# Patient Record
Sex: Female | Born: 1935
Health system: Southern US, Community
[De-identification: ages and names within clinical notes are randomized; demographics above are authoritative.]

## PROBLEM LIST (undated history)

## (undated) DIAGNOSIS — Z9889 Other specified postprocedural states: Secondary | ICD-10-CM

## (undated) DIAGNOSIS — K219 Gastro-esophageal reflux disease without esophagitis: Secondary | ICD-10-CM

## (undated) DIAGNOSIS — H35039 Hypertensive retinopathy, unspecified eye: Secondary | ICD-10-CM

## (undated) DIAGNOSIS — E039 Hypothyroidism, unspecified: Secondary | ICD-10-CM

## (undated) DIAGNOSIS — E785 Hyperlipidemia, unspecified: Secondary | ICD-10-CM

## (undated) DIAGNOSIS — I10 Essential (primary) hypertension: Secondary | ICD-10-CM

## (undated) HISTORY — PX: CARPAL TUNNEL RELEASE: SHX101

## (undated) HISTORY — PX: EYE SURGERY: SHX253

## (undated) HISTORY — PX: APPENDECTOMY: SHX54

## (undated) HISTORY — PX: LUMBAR LAMINECTOMY: SHX95

## (undated) HISTORY — DX: Essential (primary) hypertension: I10

## (undated) HISTORY — PX: COLONOSCOPY: SHX174

## (undated) HISTORY — DX: Other specified postprocedural states: Z98.890

## (undated) HISTORY — PX: TONSILLECTOMY: SUR1361

## (undated) HISTORY — DX: Hyperlipidemia, unspecified: E78.5

## (undated) HISTORY — PX: BREAST CYST ASPIRATION: SHX578

## (undated) HISTORY — DX: Hypertensive retinopathy, unspecified eye: H35.039

---

## 2001-08-20 ENCOUNTER — Ambulatory Visit (HOSPITAL_COMMUNITY): Admission: RE | Admit: 2001-08-20 | Discharge: 2001-08-20 | Payer: Self-pay | Admitting: Neurology

## 2001-08-20 ENCOUNTER — Encounter: Payer: Self-pay | Admitting: Obstetrics and Gynecology

## 2001-09-02 ENCOUNTER — Other Ambulatory Visit: Admission: RE | Admit: 2001-09-02 | Discharge: 2001-09-02 | Payer: Self-pay | Admitting: Obstetrics and Gynecology

## 2002-08-31 ENCOUNTER — Encounter: Payer: Self-pay | Admitting: Obstetrics and Gynecology

## 2002-08-31 ENCOUNTER — Ambulatory Visit (HOSPITAL_COMMUNITY): Admission: RE | Admit: 2002-08-31 | Discharge: 2002-08-31 | Payer: Self-pay | Admitting: Internal Medicine

## 2003-09-07 ENCOUNTER — Ambulatory Visit (HOSPITAL_COMMUNITY): Admission: RE | Admit: 2003-09-07 | Discharge: 2003-09-07 | Payer: Self-pay | Admitting: Obstetrics and Gynecology

## 2004-09-19 ENCOUNTER — Ambulatory Visit (HOSPITAL_COMMUNITY): Admission: RE | Admit: 2004-09-19 | Discharge: 2004-09-19 | Payer: Self-pay | Admitting: Obstetrics and Gynecology

## 2005-10-24 ENCOUNTER — Ambulatory Visit (HOSPITAL_COMMUNITY): Admission: RE | Admit: 2005-10-24 | Discharge: 2005-10-24 | Payer: Self-pay | Admitting: Obstetrics and Gynecology

## 2005-11-27 ENCOUNTER — Ambulatory Visit (HOSPITAL_COMMUNITY): Admission: RE | Admit: 2005-11-27 | Discharge: 2005-11-27 | Payer: Self-pay | Admitting: Family Medicine

## 2006-04-24 ENCOUNTER — Ambulatory Visit (HOSPITAL_BASED_OUTPATIENT_CLINIC_OR_DEPARTMENT_OTHER): Admission: RE | Admit: 2006-04-24 | Discharge: 2006-04-24 | Payer: Self-pay | Admitting: Orthopedic Surgery

## 2006-04-24 ENCOUNTER — Encounter (INDEPENDENT_AMBULATORY_CARE_PROVIDER_SITE_OTHER): Payer: Self-pay | Admitting: *Deleted

## 2006-11-04 ENCOUNTER — Ambulatory Visit (HOSPITAL_COMMUNITY): Admission: RE | Admit: 2006-11-04 | Discharge: 2006-11-04 | Payer: Self-pay | Admitting: Obstetrics and Gynecology

## 2007-11-10 ENCOUNTER — Ambulatory Visit (HOSPITAL_COMMUNITY): Admission: RE | Admit: 2007-11-10 | Discharge: 2007-11-10 | Payer: Self-pay | Admitting: Obstetrics and Gynecology

## 2008-01-28 ENCOUNTER — Ambulatory Visit: Payer: Self-pay | Admitting: Orthopedic Surgery

## 2008-01-28 DIAGNOSIS — M5430 Sciatica, unspecified side: Secondary | ICD-10-CM

## 2008-01-28 DIAGNOSIS — M545 Low back pain: Secondary | ICD-10-CM

## 2008-03-10 ENCOUNTER — Ambulatory Visit (HOSPITAL_COMMUNITY): Admission: RE | Admit: 2008-03-10 | Discharge: 2008-03-10 | Payer: Self-pay | Admitting: Family Medicine

## 2008-06-07 ENCOUNTER — Encounter: Admission: RE | Admit: 2008-06-07 | Discharge: 2008-06-07 | Payer: Self-pay | Admitting: Orthopedic Surgery

## 2008-06-08 ENCOUNTER — Ambulatory Visit (HOSPITAL_BASED_OUTPATIENT_CLINIC_OR_DEPARTMENT_OTHER): Admission: RE | Admit: 2008-06-08 | Discharge: 2008-06-08 | Payer: Self-pay | Admitting: Orthopedic Surgery

## 2008-08-19 ENCOUNTER — Ambulatory Visit: Payer: Self-pay | Admitting: Orthopedic Surgery

## 2008-08-20 ENCOUNTER — Telehealth: Payer: Self-pay | Admitting: Orthopedic Surgery

## 2008-08-24 ENCOUNTER — Ambulatory Visit (HOSPITAL_COMMUNITY): Admission: RE | Admit: 2008-08-24 | Discharge: 2008-08-24 | Payer: Self-pay | Admitting: Orthopedic Surgery

## 2008-09-01 ENCOUNTER — Ambulatory Visit: Payer: Self-pay | Admitting: Orthopedic Surgery

## 2008-09-01 DIAGNOSIS — M5126 Other intervertebral disc displacement, lumbar region: Secondary | ICD-10-CM

## 2008-09-06 ENCOUNTER — Telehealth: Payer: Self-pay | Admitting: Orthopedic Surgery

## 2008-09-06 ENCOUNTER — Encounter: Payer: Self-pay | Admitting: Orthopedic Surgery

## 2008-09-09 ENCOUNTER — Telehealth: Payer: Self-pay | Admitting: Orthopedic Surgery

## 2008-09-10 ENCOUNTER — Encounter (INDEPENDENT_AMBULATORY_CARE_PROVIDER_SITE_OTHER): Payer: Self-pay | Admitting: *Deleted

## 2008-09-13 ENCOUNTER — Encounter: Admission: RE | Admit: 2008-09-13 | Discharge: 2008-09-13 | Payer: Self-pay | Admitting: Orthopedic Surgery

## 2008-09-17 ENCOUNTER — Encounter: Payer: Self-pay | Admitting: Orthopedic Surgery

## 2008-09-20 ENCOUNTER — Telehealth: Payer: Self-pay | Admitting: Orthopedic Surgery

## 2008-09-20 ENCOUNTER — Encounter: Payer: Self-pay | Admitting: Orthopedic Surgery

## 2008-09-27 ENCOUNTER — Encounter: Admission: RE | Admit: 2008-09-27 | Discharge: 2008-09-27 | Payer: Self-pay | Admitting: Orthopedic Surgery

## 2008-10-05 ENCOUNTER — Encounter: Payer: Self-pay | Admitting: Orthopedic Surgery

## 2008-11-26 ENCOUNTER — Ambulatory Visit (HOSPITAL_COMMUNITY): Admission: RE | Admit: 2008-11-26 | Discharge: 2008-11-26 | Payer: Self-pay | Admitting: Obstetrics and Gynecology

## 2009-02-14 ENCOUNTER — Encounter: Admission: RE | Admit: 2009-02-14 | Discharge: 2009-02-14 | Payer: Self-pay | Admitting: Orthopedic Surgery

## 2009-04-20 ENCOUNTER — Encounter: Admission: RE | Admit: 2009-04-20 | Discharge: 2009-04-20 | Payer: Self-pay | Admitting: Neurosurgery

## 2009-05-19 ENCOUNTER — Inpatient Hospital Stay (HOSPITAL_COMMUNITY): Admission: RE | Admit: 2009-05-19 | Discharge: 2009-05-20 | Payer: Self-pay | Admitting: Neurosurgery

## 2009-05-19 ENCOUNTER — Encounter (INDEPENDENT_AMBULATORY_CARE_PROVIDER_SITE_OTHER): Payer: Self-pay | Admitting: Neurosurgery

## 2009-08-11 ENCOUNTER — Encounter (HOSPITAL_COMMUNITY): Admission: RE | Admit: 2009-08-11 | Discharge: 2009-09-10 | Payer: Self-pay | Admitting: Neurosurgery

## 2009-12-15 ENCOUNTER — Ambulatory Visit (HOSPITAL_COMMUNITY): Admission: RE | Admit: 2009-12-15 | Discharge: 2009-12-15 | Payer: Self-pay | Admitting: Obstetrics and Gynecology

## 2010-10-29 ENCOUNTER — Encounter: Payer: Self-pay | Admitting: Obstetrics and Gynecology

## 2010-11-21 ENCOUNTER — Other Ambulatory Visit: Payer: Self-pay | Admitting: Obstetrics and Gynecology

## 2010-11-21 DIAGNOSIS — Z139 Encounter for screening, unspecified: Secondary | ICD-10-CM

## 2010-12-18 ENCOUNTER — Ambulatory Visit (HOSPITAL_COMMUNITY): Payer: Self-pay

## 2010-12-21 ENCOUNTER — Ambulatory Visit (HOSPITAL_COMMUNITY)
Admission: RE | Admit: 2010-12-21 | Discharge: 2010-12-21 | Disposition: A | Discharge status: Home / Self Care | Payer: Medicare Other | Source: Ambulatory Visit | Attending: Obstetrics and Gynecology | Admitting: Obstetrics and Gynecology

## 2010-12-21 DIAGNOSIS — Z139 Encounter for screening, unspecified: Secondary | ICD-10-CM

## 2010-12-21 DIAGNOSIS — Z1231 Encounter for screening mammogram for malignant neoplasm of breast: Secondary | ICD-10-CM | POA: Insufficient documentation

## 2011-01-08 ENCOUNTER — Other Ambulatory Visit: Payer: Self-pay | Admitting: Neurosurgery

## 2011-01-08 DIAGNOSIS — M545 Low back pain: Secondary | ICD-10-CM

## 2011-01-13 LAB — CBC
Hemoglobin: 13.5 g/dL (ref 12.0–15.0)
MCV: 99.1 fL (ref 78.0–100.0)
RBC: 4.01 MIL/uL (ref 3.87–5.11)

## 2011-01-13 LAB — BASIC METABOLIC PANEL
CO2: 33 mEq/L — ABNORMAL HIGH (ref 19–32)
GFR calc non Af Amer: >60 mL/min (ref >60)
Glucose, Bld: 100 mg/dL — ABNORMAL HIGH (ref 70–99)

## 2011-01-13 LAB — TYPE AND SCREEN: Antibody Screen: NEGATIVE

## 2011-01-15 ENCOUNTER — Other Ambulatory Visit: Payer: Self-pay | Admitting: Neurosurgery

## 2011-01-15 ENCOUNTER — Ambulatory Visit
Admission: RE | Admit: 2011-01-15 | Discharge: 2011-01-15 | Disposition: A | Discharge status: Home / Self Care | Payer: Medicare Other | Source: Ambulatory Visit | Attending: Neurosurgery | Admitting: Neurosurgery

## 2011-01-15 DIAGNOSIS — M545 Low back pain: Secondary | ICD-10-CM

## 2011-01-15 DIAGNOSIS — M79603 Pain in arm, unspecified: Secondary | ICD-10-CM

## 2011-01-15 DIAGNOSIS — M5410 Radiculopathy, site unspecified: Secondary | ICD-10-CM

## 2011-01-15 DIAGNOSIS — M79606 Pain in leg, unspecified: Secondary | ICD-10-CM

## 2011-01-15 MED ORDER — GADOBENATE DIMEGLUMINE 529 MG/ML IV SOLN
12.0000 mL | Freq: Once | INTRAVENOUS | Status: AC | PRN
  Administered 2011-01-15: 12 mL via INTRAVENOUS

## 2011-01-18 ENCOUNTER — Ambulatory Visit
Admission: RE | Admit: 2011-01-18 | Discharge: 2011-01-18 | Disposition: A | Discharge status: Home / Self Care | Payer: Medicare Other | Source: Ambulatory Visit | Attending: Neurosurgery | Admitting: Neurosurgery

## 2011-01-18 DIAGNOSIS — M5410 Radiculopathy, site unspecified: Secondary | ICD-10-CM

## 2011-01-18 DIAGNOSIS — M79603 Pain in arm, unspecified: Secondary | ICD-10-CM

## 2011-01-18 DIAGNOSIS — M79606 Pain in leg, unspecified: Secondary | ICD-10-CM

## 2011-01-31 ENCOUNTER — Other Ambulatory Visit: Payer: Self-pay | Admitting: Obstetrics & Gynecology

## 2011-01-31 ENCOUNTER — Other Ambulatory Visit (HOSPITAL_COMMUNITY)
Admission: RE | Admit: 2011-01-31 | Discharge: 2011-01-31 | Disposition: A | Discharge status: Home / Self Care | Payer: Medicare Other | Source: Ambulatory Visit | Attending: Obstetrics & Gynecology | Admitting: Obstetrics & Gynecology

## 2011-01-31 DIAGNOSIS — Z124 Encounter for screening for malignant neoplasm of cervix: Secondary | ICD-10-CM | POA: Insufficient documentation

## 2011-02-20 NOTE — Op Note (Signed)
Kaitlyn Sanchez, Kaitlyn Sanchez                ACCOUNT NO.:  [id number]   MEDICAL RECORD NO.:  [id number]          PATIENT TYPE:  AMB   LOCATION:  DSC                          FACILITY:  [facility]   PHYSICIAN:  Cindee Salt, M.D.       DATE OF BIRTH:  [birth year]   DATE OF PROCEDURE:  06/08/2008  DATE OF DISCHARGE:                               OPERATIVE REPORT   PREOPERATIVE DIAGNOSIS:  Carpal tunnel syndrome, left hand.   POSTOPERATIVE DIAGNOSIS:  Carpal tunnel syndrome, left hand.   OPERATION:  Decompression of left median nerve.   SURGEON:  Cindee Salt, MD   ASSISTANT:  None.   ANESTHESIA:  Forearm-based IV regional with supplementation.   ANESTHESIOLOGIST:  Maren Beach, MD.   DATE OF OPERATION:  June 08, 2008.   HISTORY:  The patient is a 76 year old female with a history of carpal  tunnel syndrome.  EMG nerve conductions positive.  This has not  responded to conservative treatment.  She has elected to undergo  surgical decompression.  Postoperative course was discussed along the  risks and complications.  She is aware that there is no guarantee with  surgery, possible of infection, recurrence, injury to arteries, nerves,  and tendons, incomplete relief of symptoms, and dystrophy.  In the  preoperative area, the patient is seen.  The extremity marked by both  the patient and surgeon.  Antibiotic given.   PROCEDURE:  The patient is brought to the operating room where forearm-  based IV regional anesthetic was carried out without difficulty with the  left arm free.  She was prepped using DuraPrep, supine position.  A time-  out was taken.  A longitudinal incision was made in the palm.  She had  some feeling.  Xylocaine 1% was then infiltrated, 2 mL were used.  The  dissection was carried down to the palmar fascia.  This was split.  Superficial palmar arch identified.  The flexor tendon to the ring and  little finger identified to the ulnar side of median nerve.  Carpal  retinaculum was incised with sharp dissection.  A right-angle and Sewall  retractor were placed between skin and forearm fascia.  The fascia  released for approximately a centimeter and half proximal to the wrist  crease under direct vision.  The canal was explored.  Air compression to  the nerve was apparent.  No further lesions were identified.  The wound  was irrigated with saline.  The skin was closed with interrupted 5-0  Vicryl Rapide sutures.  Sterile compressive dressing and splint to the wrist were applied.  The  patient tolerated the procedure well.  She was taken to the recovery  room for observation in satisfactory condition.  She will be discharged  home to return to [facility] [place] in 1 week on Vicodin.           ______________________________  Cindee Salt, M.D.     GK/MEDQ  D:  06/08/2008  T:  06/09/2008  Job:  [id number]

## 2011-02-20 NOTE — Op Note (Signed)
Kaitlyn Sanchez, Kaitlyn Sanchez                ACCOUNT NO.:  [id number]   MEDICAL RECORD NO.:  [id number]          PATIENT TYPE:  INP   LOCATION:  3533                         FACILITY:  [facility]   PHYSICIAN:  Reinaldo Meeker, M.D. DATE OF BIRTH:  1935-08-07   DATE OF PROCEDURE:  05/19/2009  DATE OF DISCHARGE:                               OPERATIVE REPORT   PREOPERATIVE DIAGNOSIS:  Synovial cyst, L5-S1, left.   POSTOPERATIVE DIAGNOSIS:  Synovial cyst, L5-S1, left.   PROCEDURE:  Left L5-S1 interlaminar laminotomy with resection of  synovial cyst, followed by left L5-S1 transforaminal lumbar interbody  fusion with PEEK interbody spacer, followed by nonsegmental  instrumentation L5-S1 with transfacet screw and spinous process plate,  followed by nonsegmental posterolateral fusion L5-S1.   SECONDARY PROCEDURE:  Decompression of L5 and S1 nerve roots, more so  than needed for transforaminal lumbar interbody fusion.   SURGEON:  Reinaldo Meeker, MD   ASSISTANT:  Donalee Citrin, MD   PROCEDURE IN DETAIL:  After placing in the prone position, the patient's  back was prepped and draped in usual sterile fashion.  Localizing x-rays  were taken prior to incision to identify the appropriate level.  Midline  incision was made above the spinous processes of L5 and S1.  Using Bovie  cutting current, the incision was carried down the spinous processes.  Subperiosteal dissection was then carried out bilaterally on the spinous  processes, lamina, and facet joint.  Self-retaining retractor was placed  for exposure.  X-rays showed approach to the appropriate level.  On the  patient's left side, generous laminotomy was performed by removing the  inferior 80% of the L5 lamina, medial three-quarters of facet joint, and  the superior one half of the S1 segment.  Residual bone was removed and  saved for use later in the case.  Ligamentum flavum was removed in a  piecemeal fashion.  Obvious synovial cyst was then noted  with marked  compression of the L5 and S1 nerve roots.  This was dissected free and  then removed in a piecemeal fashion until the entire cyst had been  removed, and the nerve roots and thecal sac were well decompressed.  At  this time, inspection was carried out for any evidence of residual  compression and none could be identified.  Disk space at L5-S1 was then  incised and thoroughly cleaned out with pituitary rongeurs and curettes.  At this time, disk was prepared for transverse lumbar interbody fusion.  It was distracted up to a 10-mm size and this was felt to be a good fit.  Rotating cutters and curettes were then used to clean out the disk space  completely.  A mixture of autologous bone and OsteoSet Plus were then  placed deep within the disk space, to help with the interbody fusion.  A  PEEK interbody cage filled with the same mixture was then placed and  kicked into good position crossing the midline.  This was followed into  position by AP and lateral fluoroscopy.  At this time, instrumentation  was carried out.  Transfacet screws were placed  on the patient's right  side.  A small drill hole entry point was placed, followed by passing  the drill over a guidewire, tapping, and placing of a 30-mm screw into  good position.  All this was done under AP and lateral fluoroscopy.  In  addition, we used EMG monitoring to confirm no breech of the pedicle and  this was confirmed.  At this time, interspinous ligament was removed.  A  35-mm spinous process plate was then chosen and secured in standard  fashion at the spinous processes of L5 and S1; and final fluoroscopy in  AP and lateral direction showed excellent placement of the interbody  spacer, transfacet screw, and a fixed plate.  At this time, large  amounts of irrigation were carried out.  Gelfoam was placed over the  dura on the patient's left side.  On the patient's right side, high-  speed drill was used to decorticate the  lamina and facet joints, and a  mixture of OsteoSet Plus and autologous bone were placed for  posterolateral fusion.  Self-retaining retractor was removed.  Any  bleeding was controlled with unipolar coagulation.  The wound was then  closed in multiple layers of Vicryl in the muscle, fascia, subcutaneous,  and subcuticular tissues; and the staples were placed on the skin.  Sterile dressing was then applied and the patient was extubated, taken  to recovery room in stable condition.           ______________________________  Reinaldo Meeker, M.D.     ROK/MEDQ  D:  05/19/2009  T:  05/19/2009  Job:  [id number]

## 2011-02-23 NOTE — Op Note (Signed)
Kaitlyn Sanchez, Kaitlyn Sanchez                ACCOUNT NO.:  [id number]   MEDICAL RECORD NO.:  [id number]          PATIENT TYPE:  AMB   LOCATION:  DSC                          FACILITY:  [facility]   PHYSICIAN:  Cindee Salt, M.D.       DATE OF BIRTH:  Nov 04, 1934   DATE OF PROCEDURE:  04/24/2006  DATE OF DISCHARGE:                                 OPERATIVE REPORT   PREOPERATIVE DIAGNOSIS:  Mass, dorsal aspect right hand.   POSTOPERATIVE DIAGNOSIS:  Mass, dorsal aspect right hand.   OPERATION:  Excision of mass, dorsal aspect right hand.   SURGEON:  Cindee Salt, M.D.   ANESTHESIA:  Local.   HISTORY:  The patient is a 76 year old female with a history of a mass over  the metacarpal of her right index finger.  She is desirous of removal of  this in that it is getting caught.  It appears to be a hyperkeratosis,  keratotic horn.   PROCEDURE:  The patient was brought to minor room, prepped using DuraPrep,  supine position, right arm free.  A tourniquet placed on the forearm was  inflated to 250 mmHg.  The area was infiltrated.  An ellipse of skin was  then made removing the entire skin lesion.  This was tagged distally.  The  area was undermined and closed with interrupted 5-0 nylon sutures.  A  sterile compressive dressing was applied.  The patient tolerated the  procedure and is discharged home to return to the [facility] [place]  in 1 week on Vicodin.           ______________________________  Cindee Salt, M.D.     GK/MEDQ  D:  04/24/2006  T:  04/24/2006  Job:  [id number]

## 2011-03-02 ENCOUNTER — Ambulatory Visit (HOSPITAL_COMMUNITY)
Admission: RE | Admit: 2011-03-02 | Discharge: 2011-03-02 | Disposition: A | Discharge status: Home / Self Care | Payer: Medicare Other | Source: Ambulatory Visit | Attending: Neurosurgery | Admitting: Neurosurgery

## 2011-03-02 ENCOUNTER — Encounter (HOSPITAL_COMMUNITY)
Admission: RE | Admit: 2011-03-02 | Discharge: 2011-03-02 | Disposition: A | Discharge status: Home / Self Care | Payer: Medicare Other | Source: Ambulatory Visit | Attending: Neurosurgery | Admitting: Neurosurgery

## 2011-03-02 ENCOUNTER — Other Ambulatory Visit (HOSPITAL_COMMUNITY): Payer: Self-pay | Admitting: Neurosurgery

## 2011-03-02 DIAGNOSIS — Z01810 Encounter for preprocedural cardiovascular examination: Secondary | ICD-10-CM | POA: Insufficient documentation

## 2011-03-02 DIAGNOSIS — M47816 Spondylosis without myelopathy or radiculopathy, lumbar region: Secondary | ICD-10-CM

## 2011-03-02 DIAGNOSIS — J4489 Other specified chronic obstructive pulmonary disease: Secondary | ICD-10-CM | POA: Insufficient documentation

## 2011-03-02 DIAGNOSIS — J449 Chronic obstructive pulmonary disease, unspecified: Secondary | ICD-10-CM | POA: Insufficient documentation

## 2011-03-02 DIAGNOSIS — Z01812 Encounter for preprocedural laboratory examination: Secondary | ICD-10-CM | POA: Insufficient documentation

## 2011-03-02 DIAGNOSIS — M47817 Spondylosis without myelopathy or radiculopathy, lumbosacral region: Secondary | ICD-10-CM | POA: Insufficient documentation

## 2011-03-02 DIAGNOSIS — Z01818 Encounter for other preprocedural examination: Secondary | ICD-10-CM | POA: Insufficient documentation

## 2011-03-02 DIAGNOSIS — I10 Essential (primary) hypertension: Secondary | ICD-10-CM | POA: Insufficient documentation

## 2011-03-02 LAB — CBC
HCT: 38.5 % (ref 36.0–46.0)
MCV: 96.7 fL (ref 78.0–100.0)
RDW: 12.8 % (ref 11.5–15.5)
WBC: 6 10*3/uL (ref 4.0–10.5)

## 2011-03-02 LAB — BASIC METABOLIC PANEL
BUN: 17 mg/dL (ref 6–23)
CO2: 34 mEq/L — ABNORMAL HIGH (ref 19–32)
Chloride: 100 mEq/L (ref 96–112)
GFR calc non Af Amer: >60 mL/min (ref >60)
Glucose, Bld: 96 mg/dL (ref 70–99)
Potassium: 3.7 mEq/L (ref 3.5–5.1)

## 2011-03-08 ENCOUNTER — Inpatient Hospital Stay (HOSPITAL_COMMUNITY): Payer: Medicare Other

## 2011-03-08 ENCOUNTER — Inpatient Hospital Stay (HOSPITAL_COMMUNITY)
Admission: RE | Admit: 2011-03-08 | Discharge: 2011-03-09 | DRG: 460 | Disposition: A | Discharge status: Home / Self Care | Payer: Medicare Other | Source: Ambulatory Visit | Attending: Neurosurgery | Admitting: Neurosurgery

## 2011-03-08 DIAGNOSIS — J4489 Other specified chronic obstructive pulmonary disease: Secondary | ICD-10-CM | POA: Diagnosis present

## 2011-03-08 DIAGNOSIS — M47817 Spondylosis without myelopathy or radiculopathy, lumbosacral region: Principal | ICD-10-CM | POA: Diagnosis present

## 2011-03-08 DIAGNOSIS — J449 Chronic obstructive pulmonary disease, unspecified: Secondary | ICD-10-CM | POA: Diagnosis present

## 2011-03-08 DIAGNOSIS — I10 Essential (primary) hypertension: Secondary | ICD-10-CM | POA: Diagnosis present

## 2011-03-08 DIAGNOSIS — M7130 Other bursal cyst, unspecified site: Secondary | ICD-10-CM | POA: Diagnosis present

## 2011-04-05 NOTE — Op Note (Signed)
Kaitlyn Sanchez, Kaitlyn Sanchez                ACCOUNT NO.:  [id number]  MEDICAL RECORD NO.:  [id number]           PATIENT TYPE:  I  LOCATION:  3034                         FACILITY:  [facility]  PHYSICIAN:  Reinaldo Meeker, M.D. DATE OF BIRTH:  [birth year]  DATE OF PROCEDURE:  03/08/2011 DATE OF DISCHARGE:                              OPERATIVE REPORT   PREOPERATIVE DIAGNOSES:  Spondylosis and synovial cyst L5-S1 right with pseudoarthrosis L5-S1 status post left L5-S1 transforaminal lumbar interbody fusion.  POSTOPERATIVE DIAGNOSES:  Spondylosis and synovial cyst L5-S1 right with pseudoarthrosis L5-S1 status post left L5-S1 transforaminal lumbar interbody fusion.  PROCEDURE:  Right L5-S1 decompressive laminotomy with removal of synovial cyst followed by right L5-S1 percutaneous pedicle screw with Zimmer percutaneous pedicle screw system followed by L5-S1 posterolateral fusion.  SURGEON:  Reinaldo Meeker, MD  ASSISTANT:  Tia Alert, MDPROCEDURE IN DETAIL:  After being placed in a prone position, the patient's back was prepped and draped in usual sterile fashion. Previous lumbar incision was opened up and carried on the spinous processes.  Subperiosteal dissection was carried out bilaterally to identify the spinous process, plate was removed without difficulty.  We then did a subperiosteal dissection on the right side on the spinous processes and lamina facet joint.  Self-retaining retractor was placed for exposure.  We then moved the trans facet screw.  We then did a generous laminotomy by removing the inferior one-half of the L5 lamina, medial half of the facet joint at L5-S1, the superior third of the S1 segment.  Residual bone was removed and saved for use later in the case. Ligamentum flavum was removed and we encountered obvious synovial cyst pushing against the S1 nerve root.  I then dissected out free the nerve root.  We then coagulated any veins to make sure the S1 nerve root  was now well decompressed which it was.  We also were able to easily visualize the L5 nerve root.  At this point, we felt the decompression was adequate.  We then did a percutaneous pedicle screws on the right side.  We made a small stab wound incision, passed Jamshidi needle from lateral to medial direction to the pedicles of L5-S1, passed a wire.  We connected the incision.  At this point, we identified the transverse process of L5 and far away to the sacrum, decorticated with the Cobb periosteal elevator, and packed large amount of EquivaBone and autologous bone.  We then did sequential dilation, tapped with a 5 x 5 tap and placed 6.5 x 40-mm screw at L5 and 6.5 x 35-mm screw at S1.  We then placed the appropriate length rod, tapped with the screws, and secured with the top loading nuts and tightened final tightening.  Final fluoroscopy in AP and lateral direction showed excellent placement of the screws and rod.  Large amounts of irrigation was carried out both incisions and the wounds were both closed with multiple layers of Vicryl, muscle, fascia, subcutaneous and subcuticular stitches. Staples were placed on the skin.  Dry dressing was then applied.  The patient was then extubated and taken to the recovery room  in a stable condition.          ______________________________ Reinaldo Meeker, M.D.     ROK/MEDQ  D:  03/08/2011  T:  03/09/2011  Job:  [id number]  Electronically Signed by Aliene Beams M.D. on 04/05/2011 10:35:03 AM

## 2011-04-13 ENCOUNTER — Other Ambulatory Visit: Payer: Self-pay | Admitting: *Deleted

## 2011-04-13 NOTE — Telephone Encounter (Signed)
Denied Lyrica has not been seen since 2009

## 2011-04-16 ENCOUNTER — Other Ambulatory Visit: Payer: Self-pay | Admitting: Neurosurgery

## 2011-04-16 ENCOUNTER — Ambulatory Visit
Admission: RE | Admit: 2011-04-16 | Discharge: 2011-04-16 | Disposition: A | Discharge status: Home / Self Care | Payer: Medicare Other | Source: Ambulatory Visit | Attending: Neurosurgery | Admitting: Neurosurgery

## 2011-04-16 DIAGNOSIS — M48061 Spinal stenosis, lumbar region without neurogenic claudication: Secondary | ICD-10-CM

## 2011-06-04 ENCOUNTER — Ambulatory Visit
Admission: RE | Admit: 2011-06-04 | Discharge: 2011-06-04 | Disposition: A | Discharge status: Home / Self Care | Payer: Medicare Other | Source: Ambulatory Visit | Attending: Neurosurgery | Admitting: Neurosurgery

## 2011-06-04 ENCOUNTER — Other Ambulatory Visit: Payer: Self-pay | Admitting: Neurosurgery

## 2011-06-04 DIAGNOSIS — M47817 Spondylosis without myelopathy or radiculopathy, lumbosacral region: Secondary | ICD-10-CM

## 2011-10-09 DIAGNOSIS — Z9889 Other specified postprocedural states: Secondary | ICD-10-CM

## 2011-10-09 HISTORY — DX: Other specified postprocedural states: Z98.890

## 2011-10-23 DIAGNOSIS — H43399 Other vitreous opacities, unspecified eye: Secondary | ICD-10-CM | POA: Diagnosis not present

## 2011-10-23 DIAGNOSIS — H2510 Age-related nuclear cataract, unspecified eye: Secondary | ICD-10-CM | POA: Diagnosis not present

## 2011-10-23 DIAGNOSIS — H5200 Hypermetropia, unspecified eye: Secondary | ICD-10-CM | POA: Diagnosis not present

## 2011-10-23 DIAGNOSIS — H52229 Regular astigmatism, unspecified eye: Secondary | ICD-10-CM | POA: Diagnosis not present

## 2011-12-10 ENCOUNTER — Other Ambulatory Visit: Payer: Self-pay | Admitting: Obstetrics and Gynecology

## 2011-12-10 DIAGNOSIS — Z139 Encounter for screening, unspecified: Secondary | ICD-10-CM

## 2011-12-24 ENCOUNTER — Ambulatory Visit (HOSPITAL_COMMUNITY): Payer: Medicare Other

## 2011-12-25 ENCOUNTER — Ambulatory Visit (HOSPITAL_COMMUNITY)
Admission: RE | Admit: 2011-12-25 | Discharge: 2011-12-25 | Disposition: A | Discharge status: Home / Self Care | Payer: Medicare Other | Source: Ambulatory Visit | Attending: Obstetrics and Gynecology | Admitting: Obstetrics and Gynecology

## 2011-12-25 DIAGNOSIS — Z139 Encounter for screening, unspecified: Secondary | ICD-10-CM

## 2011-12-25 DIAGNOSIS — Z1231 Encounter for screening mammogram for malignant neoplasm of breast: Secondary | ICD-10-CM | POA: Diagnosis not present

## 2012-01-28 DIAGNOSIS — T148 Other injury of unspecified body region: Secondary | ICD-10-CM | POA: Diagnosis not present

## 2012-01-28 DIAGNOSIS — IMO0002 Reserved for concepts with insufficient information to code with codable children: Secondary | ICD-10-CM | POA: Diagnosis not present

## 2012-01-28 DIAGNOSIS — W57XXXA Bitten or stung by nonvenomous insect and other nonvenomous arthropods, initial encounter: Secondary | ICD-10-CM | POA: Diagnosis not present

## 2012-02-25 DIAGNOSIS — IMO0002 Reserved for concepts with insufficient information to code with codable children: Secondary | ICD-10-CM | POA: Diagnosis not present

## 2012-02-25 DIAGNOSIS — E785 Hyperlipidemia, unspecified: Secondary | ICD-10-CM | POA: Diagnosis not present

## 2012-02-25 DIAGNOSIS — I10 Essential (primary) hypertension: Secondary | ICD-10-CM | POA: Diagnosis not present

## 2012-02-25 DIAGNOSIS — E039 Hypothyroidism, unspecified: Secondary | ICD-10-CM | POA: Diagnosis not present

## 2012-02-28 DIAGNOSIS — E890 Postprocedural hypothyroidism: Secondary | ICD-10-CM | POA: Diagnosis not present

## 2012-03-13 ENCOUNTER — Other Ambulatory Visit (HOSPITAL_COMMUNITY): Payer: Self-pay | Admitting: Physician Assistant

## 2012-03-13 DIAGNOSIS — Z0000 Encounter for general adult medical examination without abnormal findings: Secondary | ICD-10-CM

## 2012-03-13 DIAGNOSIS — Z139 Encounter for screening, unspecified: Secondary | ICD-10-CM

## 2012-03-18 ENCOUNTER — Ambulatory Visit (HOSPITAL_COMMUNITY)
Admission: RE | Admit: 2012-03-18 | Discharge: 2012-03-18 | Disposition: A | Discharge status: Home / Self Care | Payer: Medicare Other | Source: Ambulatory Visit | Attending: Physician Assistant | Admitting: Physician Assistant

## 2012-03-18 DIAGNOSIS — Z13820 Encounter for screening for osteoporosis: Secondary | ICD-10-CM | POA: Insufficient documentation

## 2012-03-18 DIAGNOSIS — M899 Disorder of bone, unspecified: Secondary | ICD-10-CM | POA: Insufficient documentation

## 2012-03-18 DIAGNOSIS — Z139 Encounter for screening, unspecified: Secondary | ICD-10-CM

## 2012-03-18 DIAGNOSIS — Z0000 Encounter for general adult medical examination without abnormal findings: Secondary | ICD-10-CM

## 2012-03-18 DIAGNOSIS — Z780 Asymptomatic menopausal state: Secondary | ICD-10-CM | POA: Diagnosis not present

## 2012-03-18 DIAGNOSIS — M810 Age-related osteoporosis without current pathological fracture: Secondary | ICD-10-CM | POA: Diagnosis not present

## 2012-04-29 DIAGNOSIS — E890 Postprocedural hypothyroidism: Secondary | ICD-10-CM | POA: Diagnosis not present

## 2012-06-06 DIAGNOSIS — Z2821 Immunization not carried out because of patient refusal: Secondary | ICD-10-CM | POA: Diagnosis not present

## 2012-06-06 DIAGNOSIS — I214 Non-ST elevation (NSTEMI) myocardial infarction: Secondary | ICD-10-CM | POA: Diagnosis not present

## 2012-06-06 DIAGNOSIS — R5381 Other malaise: Secondary | ICD-10-CM | POA: Diagnosis not present

## 2012-06-06 DIAGNOSIS — I10 Essential (primary) hypertension: Secondary | ICD-10-CM | POA: Diagnosis not present

## 2012-06-06 DIAGNOSIS — Z7982 Long term (current) use of aspirin: Secondary | ICD-10-CM | POA: Diagnosis not present

## 2012-06-06 DIAGNOSIS — J449 Chronic obstructive pulmonary disease, unspecified: Secondary | ICD-10-CM | POA: Diagnosis not present

## 2012-06-06 DIAGNOSIS — R42 Dizziness and giddiness: Secondary | ICD-10-CM | POA: Diagnosis not present

## 2012-06-06 DIAGNOSIS — Z79899 Other long term (current) drug therapy: Secondary | ICD-10-CM | POA: Diagnosis not present

## 2012-06-06 DIAGNOSIS — E876 Hypokalemia: Secondary | ICD-10-CM | POA: Diagnosis not present

## 2012-06-06 DIAGNOSIS — E039 Hypothyroidism, unspecified: Secondary | ICD-10-CM | POA: Diagnosis not present

## 2012-06-06 DIAGNOSIS — G459 Transient cerebral ischemic attack, unspecified: Secondary | ICD-10-CM | POA: Diagnosis not present

## 2012-06-07 DIAGNOSIS — E876 Hypokalemia: Secondary | ICD-10-CM | POA: Diagnosis not present

## 2012-06-07 DIAGNOSIS — I10 Essential (primary) hypertension: Secondary | ICD-10-CM | POA: Diagnosis not present

## 2012-06-07 DIAGNOSIS — Z2821 Immunization not carried out because of patient refusal: Secondary | ICD-10-CM | POA: Diagnosis not present

## 2012-06-07 DIAGNOSIS — I498 Other specified cardiac arrhythmias: Secondary | ICD-10-CM | POA: Diagnosis not present

## 2012-06-07 DIAGNOSIS — Z7982 Long term (current) use of aspirin: Secondary | ICD-10-CM | POA: Diagnosis not present

## 2012-06-07 DIAGNOSIS — I369 Nonrheumatic tricuspid valve disorder, unspecified: Secondary | ICD-10-CM | POA: Diagnosis not present

## 2012-06-07 DIAGNOSIS — Z87891 Personal history of nicotine dependence: Secondary | ICD-10-CM | POA: Diagnosis not present

## 2012-06-07 DIAGNOSIS — I214 Non-ST elevation (NSTEMI) myocardial infarction: Secondary | ICD-10-CM | POA: Diagnosis not present

## 2012-06-07 DIAGNOSIS — R5381 Other malaise: Secondary | ICD-10-CM | POA: Diagnosis not present

## 2012-06-07 DIAGNOSIS — Z981 Arthrodesis status: Secondary | ICD-10-CM | POA: Diagnosis not present

## 2012-06-07 DIAGNOSIS — E785 Hyperlipidemia, unspecified: Secondary | ICD-10-CM | POA: Diagnosis present

## 2012-06-07 DIAGNOSIS — R42 Dizziness and giddiness: Secondary | ICD-10-CM | POA: Diagnosis not present

## 2012-06-07 DIAGNOSIS — M479 Spondylosis, unspecified: Secondary | ICD-10-CM | POA: Diagnosis present

## 2012-06-07 DIAGNOSIS — I161 Hypertensive emergency: Secondary | ICD-10-CM | POA: Insufficient documentation

## 2012-06-07 DIAGNOSIS — R55 Syncope and collapse: Secondary | ICD-10-CM | POA: Diagnosis not present

## 2012-06-07 DIAGNOSIS — I509 Heart failure, unspecified: Secondary | ICD-10-CM | POA: Diagnosis not present

## 2012-06-07 DIAGNOSIS — F411 Generalized anxiety disorder: Secondary | ICD-10-CM | POA: Diagnosis present

## 2012-06-07 DIAGNOSIS — I209 Angina pectoris, unspecified: Secondary | ICD-10-CM | POA: Diagnosis not present

## 2012-06-07 DIAGNOSIS — I248 Other forms of acute ischemic heart disease: Secondary | ICD-10-CM | POA: Diagnosis present

## 2012-06-07 DIAGNOSIS — E039 Hypothyroidism, unspecified: Secondary | ICD-10-CM | POA: Diagnosis present

## 2012-06-07 DIAGNOSIS — I2510 Atherosclerotic heart disease of native coronary artery without angina pectoris: Secondary | ICD-10-CM | POA: Diagnosis present

## 2012-06-16 DIAGNOSIS — I10 Essential (primary) hypertension: Secondary | ICD-10-CM | POA: Diagnosis not present

## 2012-06-16 DIAGNOSIS — Z23 Encounter for immunization: Secondary | ICD-10-CM | POA: Diagnosis not present

## 2012-06-16 DIAGNOSIS — E785 Hyperlipidemia, unspecified: Secondary | ICD-10-CM | POA: Diagnosis not present

## 2012-06-16 DIAGNOSIS — IMO0002 Reserved for concepts with insufficient information to code with codable children: Secondary | ICD-10-CM | POA: Diagnosis not present

## 2012-06-16 DIAGNOSIS — E039 Hypothyroidism, unspecified: Secondary | ICD-10-CM | POA: Diagnosis not present

## 2012-06-30 DIAGNOSIS — E890 Postprocedural hypothyroidism: Secondary | ICD-10-CM | POA: Diagnosis not present

## 2012-07-04 DIAGNOSIS — I10 Essential (primary) hypertension: Secondary | ICD-10-CM | POA: Diagnosis not present

## 2012-07-28 DIAGNOSIS — IMO0002 Reserved for concepts with insufficient information to code with codable children: Secondary | ICD-10-CM | POA: Diagnosis not present

## 2012-07-28 DIAGNOSIS — J019 Acute sinusitis, unspecified: Secondary | ICD-10-CM | POA: Diagnosis not present

## 2012-07-28 DIAGNOSIS — I10 Essential (primary) hypertension: Secondary | ICD-10-CM | POA: Diagnosis not present

## 2012-10-22 DIAGNOSIS — H52229 Regular astigmatism, unspecified eye: Secondary | ICD-10-CM | POA: Diagnosis not present

## 2012-10-22 DIAGNOSIS — H5200 Hypermetropia, unspecified eye: Secondary | ICD-10-CM | POA: Diagnosis not present

## 2012-10-22 DIAGNOSIS — H2589 Other age-related cataract: Secondary | ICD-10-CM | POA: Diagnosis not present

## 2012-10-22 DIAGNOSIS — H35039 Hypertensive retinopathy, unspecified eye: Secondary | ICD-10-CM | POA: Diagnosis not present

## 2012-10-30 DIAGNOSIS — I10 Essential (primary) hypertension: Secondary | ICD-10-CM | POA: Diagnosis not present

## 2012-10-30 DIAGNOSIS — R42 Dizziness and giddiness: Secondary | ICD-10-CM | POA: Insufficient documentation

## 2012-10-30 DIAGNOSIS — J449 Chronic obstructive pulmonary disease, unspecified: Secondary | ICD-10-CM | POA: Insufficient documentation

## 2012-11-23 DIAGNOSIS — R0989 Other specified symptoms and signs involving the circulatory and respiratory systems: Secondary | ICD-10-CM | POA: Insufficient documentation

## 2012-11-24 DIAGNOSIS — G4700 Insomnia, unspecified: Secondary | ICD-10-CM | POA: Diagnosis not present

## 2012-11-24 DIAGNOSIS — IMO0002 Reserved for concepts with insufficient information to code with codable children: Secondary | ICD-10-CM | POA: Diagnosis not present

## 2012-12-01 ENCOUNTER — Other Ambulatory Visit (HOSPITAL_COMMUNITY): Payer: Self-pay | Admitting: Obstetrics and Gynecology

## 2012-12-01 DIAGNOSIS — Z139 Encounter for screening, unspecified: Secondary | ICD-10-CM

## 2012-12-26 ENCOUNTER — Ambulatory Visit (HOSPITAL_COMMUNITY)
Admission: RE | Admit: 2012-12-26 | Discharge: 2012-12-26 | Disposition: A | Discharge status: Home / Self Care | Payer: Medicare Other | Source: Ambulatory Visit | Attending: Obstetrics and Gynecology | Admitting: Obstetrics and Gynecology

## 2012-12-26 DIAGNOSIS — Z1231 Encounter for screening mammogram for malignant neoplasm of breast: Secondary | ICD-10-CM | POA: Diagnosis not present

## 2012-12-26 DIAGNOSIS — Z139 Encounter for screening, unspecified: Secondary | ICD-10-CM

## 2013-01-08 DIAGNOSIS — H903 Sensorineural hearing loss, bilateral: Secondary | ICD-10-CM | POA: Diagnosis not present

## 2013-02-26 DIAGNOSIS — Z0000 Encounter for general adult medical examination without abnormal findings: Secondary | ICD-10-CM | POA: Diagnosis not present

## 2013-02-26 DIAGNOSIS — IMO0002 Reserved for concepts with insufficient information to code with codable children: Secondary | ICD-10-CM | POA: Diagnosis not present

## 2013-02-26 DIAGNOSIS — E785 Hyperlipidemia, unspecified: Secondary | ICD-10-CM | POA: Diagnosis not present

## 2013-02-26 DIAGNOSIS — I10 Essential (primary) hypertension: Secondary | ICD-10-CM | POA: Diagnosis not present

## 2013-02-26 DIAGNOSIS — E039 Hypothyroidism, unspecified: Secondary | ICD-10-CM | POA: Diagnosis not present

## 2013-02-26 DIAGNOSIS — Z23 Encounter for immunization: Secondary | ICD-10-CM | POA: Diagnosis not present

## 2013-03-03 DIAGNOSIS — E890 Postprocedural hypothyroidism: Secondary | ICD-10-CM | POA: Diagnosis not present

## 2013-04-14 DIAGNOSIS — E890 Postprocedural hypothyroidism: Secondary | ICD-10-CM | POA: Diagnosis not present

## 2013-07-07 DIAGNOSIS — L821 Other seborrheic keratosis: Secondary | ICD-10-CM | POA: Diagnosis not present

## 2013-07-07 DIAGNOSIS — Z85828 Personal history of other malignant neoplasm of skin: Secondary | ICD-10-CM | POA: Diagnosis not present

## 2013-07-07 DIAGNOSIS — D485 Neoplasm of uncertain behavior of skin: Secondary | ICD-10-CM | POA: Diagnosis not present

## 2013-07-07 DIAGNOSIS — D235 Other benign neoplasm of skin of trunk: Secondary | ICD-10-CM | POA: Diagnosis not present

## 2013-07-17 DIAGNOSIS — Z23 Encounter for immunization: Secondary | ICD-10-CM | POA: Diagnosis not present

## 2013-09-07 DIAGNOSIS — E890 Postprocedural hypothyroidism: Secondary | ICD-10-CM | POA: Diagnosis not present

## 2013-09-09 DIAGNOSIS — G4700 Insomnia, unspecified: Secondary | ICD-10-CM | POA: Diagnosis not present

## 2013-09-09 DIAGNOSIS — IMO0002 Reserved for concepts with insufficient information to code with codable children: Secondary | ICD-10-CM | POA: Diagnosis not present

## 2013-09-25 DIAGNOSIS — I10 Essential (primary) hypertension: Secondary | ICD-10-CM | POA: Diagnosis not present

## 2013-09-25 DIAGNOSIS — IMO0002 Reserved for concepts with insufficient information to code with codable children: Secondary | ICD-10-CM | POA: Diagnosis not present

## 2013-10-23 DIAGNOSIS — H524 Presbyopia: Secondary | ICD-10-CM | POA: Diagnosis not present

## 2013-10-23 DIAGNOSIS — H2589 Other age-related cataract: Secondary | ICD-10-CM | POA: Diagnosis not present

## 2013-10-23 DIAGNOSIS — H5200 Hypermetropia, unspecified eye: Secondary | ICD-10-CM | POA: Diagnosis not present

## 2013-10-23 DIAGNOSIS — H11159 Pinguecula, unspecified eye: Secondary | ICD-10-CM | POA: Diagnosis not present

## 2013-12-21 ENCOUNTER — Encounter (HOSPITAL_COMMUNITY): Payer: Self-pay | Admitting: Pharmacy Technician

## 2013-12-21 DIAGNOSIS — H401490 Capsular glaucoma with pseudoexfoliation of lens, unspecified eye, stage unspecified: Secondary | ICD-10-CM | POA: Diagnosis not present

## 2013-12-21 DIAGNOSIS — H2510 Age-related nuclear cataract, unspecified eye: Secondary | ICD-10-CM | POA: Diagnosis not present

## 2013-12-22 ENCOUNTER — Encounter (HOSPITAL_COMMUNITY)
Admission: RE | Admit: 2013-12-22 | Discharge: 2013-12-22 | Disposition: A | Discharge status: Home / Self Care | Payer: Medicare Other | Source: Ambulatory Visit | Attending: Ophthalmology | Admitting: Ophthalmology

## 2013-12-22 ENCOUNTER — Encounter (HOSPITAL_COMMUNITY): Payer: Self-pay

## 2013-12-22 DIAGNOSIS — Z01812 Encounter for preprocedural laboratory examination: Secondary | ICD-10-CM | POA: Diagnosis not present

## 2013-12-22 DIAGNOSIS — Z01810 Encounter for preprocedural cardiovascular examination: Secondary | ICD-10-CM | POA: Diagnosis not present

## 2013-12-22 HISTORY — DX: Hypothyroidism, unspecified: E03.9

## 2013-12-22 HISTORY — DX: Gastro-esophageal reflux disease without esophagitis: K21.9

## 2013-12-22 LAB — BASIC METABOLIC PANEL
BUN: 18 mg/dL (ref 6–23)
CO2: 28 mEq/L (ref 19–32)
Calcium: 9.5 mg/dL (ref 8.4–10.5)
Chloride: 96 mEq/L (ref 96–112)
Creatinine, Ser: 0.73 mg/dL (ref 0.50–1.10)
GFR, EST NON AFRICAN AMERICAN: 81 mL/min — AB (ref >90)
GLUCOSE: 125 mg/dL — AB (ref 70–99)
POTASSIUM: 4 meq/L (ref 3.7–5.3)
SODIUM: 135 meq/L — AB (ref 137–147)

## 2013-12-22 LAB — HEMOGLOBIN AND HEMATOCRIT, BLOOD
HCT: 33.8 % — ABNORMAL LOW (ref 36.0–46.0)
Hemoglobin: 11.7 g/dL — ABNORMAL LOW (ref 12.0–15.0)

## 2013-12-22 NOTE — Patient Instructions (Addendum)
Your procedure is scheduled on:  Monday, 12/28/13  Report to [facility] at    1230    AM.  Call this number if you have problems the morning of surgery: [phone]   Remember:   Do not eat or drink   After Midnight.  Take these medicines the morning of surgery with A SIP OF WATER: amlodipine, levothyroxine, benicar, and ativan if needed.   Do not wear jewelry, make-up or nail polish.  Do not wear lotions, powders, or perfumes. You may wear deodorant.  Do not bring valuables to the hospital.  Contacts, dentures or bridgework may not be worn into surgery.     Patients discharged the day of surgery will not be allowed to drive home.  Name and phone number of your driver: driver  Special Instructions: Use eye drops as directed.   Please read over the following fact sheets that you were given: Pain Booklet, Anesthesia Post-op Instructions and Care and Recovery After Surgery    Cataract Surgery  A cataract is a clouding of the lens of the eye. When a lens becomes cloudy, vision is reduced based on the degree and nature of the clouding. Surgery may be needed to improve vision. Surgery removes the cloudy lens and usually replaces it with a substitute lens (intraocular lens, IOL). LET YOUR EYE DOCTOR KNOW ABOUT:  Allergies to food or medicine.   Medicines taken including herbs, eyedrops, over-the-counter medicines, and creams.   Use of steroids (by mouth or creams).   Previous problems with anesthetics or numbing medicine.   History of bleeding problems or blood clots.   Previous surgery.   Other health problems, including diabetes and kidney problems.   Possibility of pregnancy, if this applies.  RISKS AND COMPLICATIONS  Infection.   Inflammation of the eyeball (endophthalmitis) that can spread to both eyes (sympathetic ophthalmia).   Poor wound healing.   If an IOL is inserted, it can later fall out of proper position. This is very uncommon.   Clouding of the part of your eye  that holds an IOL in place. This is called an "after-cataract." These are uncommon, but easily treated.  BEFORE THE PROCEDURE  Do not eat or drink anything except small amounts of water for 8 to 12 before your surgery, or as directed by your caregiver.   Unless you are told otherwise, continue any eyedrops you have been prescribed.   Talk to your primary caregiver about all other medicines that you take (both prescription and non-prescription). In some cases, you may need to stop or change medicines near the time of your surgery. This is most important if you are taking blood-thinning medicine.Do not stop medicines unless you are told to do so.   Arrange for someone to drive you to and from the procedure.   Do not put contact lenses in either eye on the day of your surgery.  PROCEDURE There is more than one method for safely removing a cataract. Your doctor can explain the differences and help determine which is best for you. Phacoemulsification surgery is the most common form of cataract surgery.  An injection is given behind the eye or eyedrops are given to make this a painless procedure.   A small cut (incision) is made on the edge of the clear, dome-shaped surface that covers the front of the eye (cornea).   A tiny probe is painlessly inserted into the eye. This device gives off ultrasound waves that soften and break up the cloudy center  of the lens. This makes it easier for the cloudy lens to be removed by suction.   An IOL may be implanted.   The normal lens of the eye is covered by a clear capsule. Part of that capsule is intentionally left in the eye to support the IOL.   Your surgeon may or may not use stitches to close the incision.  There are other forms of cataract surgery that require a larger incision and stiches to close the eye. This approach is taken in cases where the doctor feels that the cataract cannot be easily removed using phacoemulsification. AFTER THE  PROCEDURE  When an IOL is implanted, it does not need care. It becomes a permanent part of your eye and cannot be seen or felt.   Your doctor will schedule follow-up exams to check on your progress.   Review your other medicines with your doctor to see which can be resumed after surgery.   Use eyedrops or take medicine as prescribed by your doctor.  Document Released: 09/13/2011 Document Reviewed: 09/10/2011 [facility] Patient Information 2012 [place].  PATIENT INSTRUCTIONS POST-ANESTHESIA  IMMEDIATELY FOLLOWING SURGERY:  Do not drive or operate machinery for the first twenty four hours after surgery.  Do not make any important decisions for twenty four hours after surgery or while taking narcotic pain medications or sedatives.  If you develop intractable nausea and vomiting or a severe headache please notify your doctor immediately.  FOLLOW-UP:  Please make an appointment with your surgeon as instructed. You do not need to follow up with anesthesia unless specifically instructed to do so.  WOUND CARE INSTRUCTIONS (if applicable):  Keep a dry clean dressing on the anesthesia/puncture wound site if there is drainage.  Once the wound has quit draining you may leave it open to air.  Generally you should leave the bandage intact for twenty four hours unless there is drainage.  If the epidural site drains for more than 36-48 hours please call the anesthesia department.  QUESTIONS?:  Please feel free to call your physician or the hospital operator if you have any questions, and they will be happy to assist you.

## 2013-12-25 MED ORDER — CYCLOPENTOLATE-PHENYLEPHRINE OP SOLN OPTIME - NO CHARGE
OPHTHALMIC | Status: AC
  Filled 2013-12-25: qty 2

## 2013-12-25 MED ORDER — LIDOCAINE HCL (PF) 1 % IJ SOLN
INTRAMUSCULAR | Status: AC
  Filled 2013-12-25: qty 2

## 2013-12-25 MED ORDER — TETRACAINE HCL 0.5 % OP SOLN
OPHTHALMIC | Status: AC
  Filled 2013-12-25: qty 2

## 2013-12-25 MED ORDER — NEOMYCIN-POLYMYXIN-DEXAMETH 3.5-10000-0.1 OP SUSP
OPHTHALMIC | Status: AC
  Filled 2013-12-25: qty 5

## 2013-12-25 MED ORDER — LIDOCAINE HCL 3.5 % OP GEL
OPHTHALMIC | Status: AC
  Filled 2013-12-25: qty 1

## 2013-12-25 MED ORDER — PHENYLEPHRINE HCL 2.5 % OP SOLN
OPHTHALMIC | Status: AC
  Filled 2013-12-25: qty 15

## 2013-12-28 ENCOUNTER — Ambulatory Visit (HOSPITAL_COMMUNITY): Payer: Medicare Other | Admitting: Anesthesiology

## 2013-12-28 ENCOUNTER — Ambulatory Visit (HOSPITAL_COMMUNITY)
Admission: RE | Admit: 2013-12-28 | Discharge: 2013-12-28 | Disposition: A | Discharge status: Home / Self Care | Payer: Medicare Other | Source: Ambulatory Visit | Attending: Ophthalmology | Admitting: Ophthalmology

## 2013-12-28 ENCOUNTER — Encounter (HOSPITAL_COMMUNITY): Payer: Medicare Other | Admitting: Anesthesiology

## 2013-12-28 ENCOUNTER — Encounter (HOSPITAL_COMMUNITY): Payer: Self-pay | Admitting: *Deleted

## 2013-12-28 ENCOUNTER — Encounter (HOSPITAL_COMMUNITY)
Admission: RE | Disposition: A | Discharge status: Home / Self Care | Payer: Self-pay | Source: Ambulatory Visit | Attending: Ophthalmology

## 2013-12-28 DIAGNOSIS — H2510 Age-related nuclear cataract, unspecified eye: Secondary | ICD-10-CM | POA: Diagnosis not present

## 2013-12-28 DIAGNOSIS — H268 Other specified cataract: Secondary | ICD-10-CM | POA: Diagnosis not present

## 2013-12-28 DIAGNOSIS — Z79899 Other long term (current) drug therapy: Secondary | ICD-10-CM | POA: Diagnosis not present

## 2013-12-28 DIAGNOSIS — H269 Unspecified cataract: Secondary | ICD-10-CM | POA: Diagnosis not present

## 2013-12-28 HISTORY — PX: CATARACT EXTRACTION W/PHACO: SHX586

## 2013-12-28 SURGERY — PHACOEMULSIFICATION, CATARACT, WITH IOL INSERTION
Anesthesia: Monitor Anesthesia Care | Site: Eye | Laterality: Left

## 2013-12-28 MED ORDER — FENTANYL CITRATE 0.05 MG/ML IJ SOLN
25.0000 ug | INTRAMUSCULAR | Status: AC
  Administered 2013-12-28 (×2): 25 ug via INTRAVENOUS

## 2013-12-28 MED ORDER — LACTATED RINGERS IV SOLN
INTRAVENOUS | Status: DC
  Administered 2013-12-28: 1000 mL via INTRAVENOUS

## 2013-12-28 MED ORDER — FENTANYL CITRATE 0.05 MG/ML IJ SOLN
INTRAMUSCULAR | Status: AC
  Filled 2013-12-28: qty 2

## 2013-12-28 MED ORDER — LIDOCAINE HCL 3.5 % OP GEL
1.0000 "application " | Freq: Once | OPHTHALMIC | Status: AC
  Administered 2013-12-28: 1 via OPHTHALMIC

## 2013-12-28 MED ORDER — PHENYLEPHRINE HCL 2.5 % OP SOLN
1.0000 [drp] | OPHTHALMIC | Status: AC
  Administered 2013-12-28 (×3): 1 [drp] via OPHTHALMIC

## 2013-12-28 MED ORDER — NEOMYCIN-POLYMYXIN-DEXAMETH 3.5-10000-0.1 OP SUSP
OPHTHALMIC | Status: DC | PRN
  Administered 2013-12-28: 2 [drp] via OPHTHALMIC

## 2013-12-28 MED ORDER — LIDOCAINE HCL (PF) 1 % IJ SOLN
INTRAMUSCULAR | Status: DC | PRN
  Administered 2013-12-28: .6 mL

## 2013-12-28 MED ORDER — LIDOCAINE 3.5 % OP GEL OPTIME - NO CHARGE
OPHTHALMIC | Status: DC | PRN
  Administered 2013-12-28: 2 [drp] via OPHTHALMIC

## 2013-12-28 MED ORDER — EPINEPHRINE HCL 1 MG/ML IJ SOLN
INTRAMUSCULAR | Status: DC | PRN
  Administered 2013-12-28: 14:00:00

## 2013-12-28 MED ORDER — BSS IO SOLN
INTRAOCULAR | Status: DC | PRN
Start: 2013-12-28 — End: 2013-12-28
  Administered 2013-12-28: 15 mL via INTRAOCULAR

## 2013-12-28 MED ORDER — CYCLOPENTOLATE-PHENYLEPHRINE 0.2-1 % OP SOLN
1.0000 [drp] | OPHTHALMIC | Status: AC
  Administered 2013-12-28 (×3): 1 [drp] via OPHTHALMIC

## 2013-12-28 MED ORDER — POVIDONE-IODINE 5 % OP SOLN
OPHTHALMIC | Status: DC | PRN
  Administered 2013-12-28: 1 via OPHTHALMIC

## 2013-12-28 MED ORDER — PROVISC 10 MG/ML IO SOLN
INTRAOCULAR | Status: DC | PRN
  Administered 2013-12-28: 0.85 mL via INTRAOCULAR

## 2013-12-28 MED ORDER — MIDAZOLAM HCL 5 MG/5ML IJ SOLN
INTRAMUSCULAR | Status: AC
  Filled 2013-12-28: qty 5

## 2013-12-28 MED ORDER — MIDAZOLAM HCL 2 MG/2ML IJ SOLN
1.0000 mg | INTRAMUSCULAR | Status: DC | PRN
  Administered 2013-12-28: 2 mg via INTRAVENOUS

## 2013-12-28 MED ORDER — TETRACAINE HCL 0.5 % OP SOLN
1.0000 [drp] | OPHTHALMIC | Status: AC
  Administered 2013-12-28 (×3): 1 [drp] via OPHTHALMIC

## 2013-12-28 SURGICAL SUPPLY — 34 items
CAPSULAR TENSION RING-AMO (OPHTHALMIC RELATED) IMPLANT
CLOTH BEACON ORANGE TIMEOUT ST (SAFETY) ×3 IMPLANT
EYE SHIELD UNIVERSAL CLEAR (GAUZE/BANDAGES/DRESSINGS) ×3 IMPLANT
GLOVE BIO SURGEON STRL SZ 6.5 (GLOVE) IMPLANT
GLOVE BIO SURGEONS STRL SZ 6.5 (GLOVE)
GLOVE BIOGEL PI IND STRL 6.5 (GLOVE) IMPLANT
GLOVE BIOGEL PI IND STRL 7.0 (GLOVE) ×1 IMPLANT
GLOVE BIOGEL PI IND STRL 7.5 (GLOVE) IMPLANT
GLOVE BIOGEL PI INDICATOR 6.5 (GLOVE)
GLOVE BIOGEL PI INDICATOR 7.0 (GLOVE) ×2
GLOVE BIOGEL PI INDICATOR 7.5 (GLOVE)
GLOVE ECLIPSE 6.5 STRL STRAW (GLOVE) IMPLANT
GLOVE ECLIPSE 7.0 STRL STRAW (GLOVE) IMPLANT
GLOVE ECLIPSE 7.5 STRL STRAW (GLOVE) IMPLANT
GLOVE EXAM NITRILE LRG STRL (GLOVE) IMPLANT
GLOVE EXAM NITRILE MD LF STRL (GLOVE) IMPLANT
GLOVE SKINSENSE NS SZ6.5 (GLOVE)
GLOVE SKINSENSE NS SZ7.0 (GLOVE)
GLOVE SKINSENSE STRL SZ6.5 (GLOVE) IMPLANT
GLOVE SKINSENSE STRL SZ7.0 (GLOVE) IMPLANT
GLOVE SS N UNI LF 7.0 STRL (GLOVE) ×3 IMPLANT
KIT VITRECTOMY (OPHTHALMIC RELATED) IMPLANT
PAD ARMBOARD 7.5X6 YLW CONV (MISCELLANEOUS) ×3 IMPLANT
PROC W NO LENS (INTRAOCULAR LENS)
PROC W SPEC LENS (INTRAOCULAR LENS)
PROCESS W NO LENS (INTRAOCULAR LENS) IMPLANT
PROCESS W SPEC LENS (INTRAOCULAR LENS) IMPLANT
RING MALYGIN (MISCELLANEOUS) IMPLANT
SIGHTPATH CAT PROC W REG LENS (Ophthalmic Related) ×3 IMPLANT
SYR TB 1ML LL NO SAFETY (SYRINGE) ×3 IMPLANT
TAPE SURG TRANSPORE 1 IN (GAUZE/BANDAGES/DRESSINGS) ×1 IMPLANT
TAPE SURGICAL TRANSPORE 1 IN (GAUZE/BANDAGES/DRESSINGS) ×2
VISCOELASTIC ADDITIONAL (OPHTHALMIC RELATED) IMPLANT
WATER STERILE IRR 250ML POUR (IV SOLUTION) ×3 IMPLANT

## 2013-12-28 NOTE — H&P (Signed)
I have reviewed the H&P, the patient was re-examined, and I have identified no interval changes in medical condition and plan of care since the history and physical of record  

## 2013-12-28 NOTE — Transfer of Care (Signed)
Immediate Anesthesia Transfer of Care Note  Patient: Kaitlyn Sanchez  Procedure(s) Performed: Procedure(s) with comments: CATARACT EXTRACTION PHACO AND INTRAOCULAR LENS PLACEMENT (IOC) (Left) - CDE 18.64  Patient Location: Short Stay  Anesthesia Type:MAC  Level of Consciousness: awake, alert  and oriented  Airway & Oxygen Therapy: Patient Spontanous Breathing  Post-op Assessment: Report given to PACU RN  Post vital signs: Reviewed  Complications: No apparent anesthesia complications

## 2013-12-28 NOTE — Anesthesia Postprocedure Evaluation (Signed)
  Anesthesia Post-op Note  Patient: Kaitlyn Sanchez  Procedure(s) Performed: Procedure(s) with comments: CATARACT EXTRACTION PHACO AND INTRAOCULAR LENS PLACEMENT (IOC) (Left) - CDE 18.64  Patient Location: PACU  Anesthesia Type:MAC  Level of Consciousness: awake  Airway and Oxygen Therapy: Patient Spontanous Breathing  Post-op Pain: none  Post-op Assessment: Post-op Vital signs reviewed, Patient's Cardiovascular Status Stable, Respiratory Function Stable, Patent Airway and No signs of Nausea or vomiting  Post-op Vital Signs: Reviewed and stable  Complications: No apparent anesthesia complications

## 2013-12-28 NOTE — Anesthesia Preprocedure Evaluation (Signed)
Anesthesia Evaluation  Patient identified by MRN, date of birth, ID band Patient awake    Reviewed: Allergy & Precautions  Airway Mallampati: II TM Distance: >3 FB Neck ROM: Full    Dental  (+) Teeth Intact   Pulmonary neg pulmonary ROS,  breath sounds clear to auscultation        Cardiovascular hypertension, Pt. on medications Rhythm:Regular Rate:Normal     Neuro/Psych    GI/Hepatic GERD-  Medicated and Controlled,  Endo/Other  Hypothyroidism   Renal/GU      Musculoskeletal   Abdominal   Peds  Hematology   Anesthesia Other Findings   Reproductive/Obstetrics                           Anesthesia Physical Anesthesia Plan  ASA: II  Anesthesia Plan: MAC   Post-op Pain Management:    Induction: Intravenous  Airway Management Planned: Nasal Cannula  Additional Equipment:   Intra-op Plan:   Post-operative Plan:   Informed Consent: I have reviewed the patients History and Physical, chart, labs and discussed the procedure including the risks, benefits and alternatives for the proposed anesthesia with the patient or authorized representative who has indicated his/her understanding and acceptance.     Plan Discussed with:   Anesthesia Plan Comments:         Anesthesia Quick Evaluation  

## 2013-12-28 NOTE — Op Note (Signed)
Date of Admission: 12/28/2013  Date of Surgery: 12/28/2013   Pre-Op Dx: Cataract Left Eye  Post-Op Dx: Cataract Left  Eye,  Dx Code 366.16, Pseudoexfoliation Syndrome, Left Eye, Dx Code: 366.11  Surgeon: Tonny Branch, M.D.  Assistants: None  Anesthesia: Topical with MAC  Indications: Painless, progressive loss of vision with compromise of daily activities.  Surgery: Cataract Extraction with Intraocular lens Implant Left Eye  Discription: The patient had dilating drops and viscous lidocaine placed into the Left eye in the pre-op holding area. After transfer to the operating room, a time out was performed. The patient was then prepped and draped. Beginning with a 14 degree blade a paracentesis port was made at the surgeon's 2 o'clock position. The anterior chamber was then filled with 1% non-preserved lidocaine. This was followed by filling the anterior chamber with Provisc.  A 2.62mm keratome blade was used to make a clear corneal incision at the temporal limbus.  A bent cystatome needle was used to create a continuous tear capsulotomy. Hydrodissection was performed with balanced salt solution on a Fine canula. The lens nucleus was then removed using the phacoemulsification handpiece. Residual cortex was removed with the I&A handpiece. The anterior chamber and capsular bag were refilled with Provisc. A posterior chamber intraocular lens was placed into the capsular bag with it's injector. The implant was positioned with the Kuglan hook. The Provisc was then removed from the anterior chamber and capsular bag with the I&A handpiece. Stromal hydration of the main incision and paracentesis port was performed with BSS on a Fine canula. The wounds were tested for leak which was negative. The patient tolerated the procedure well. There were no operative complications. The patient was then transferred to the recovery room in stable condition.  Complications: None  Specimen: None  EBL: None  Prosthetic  device: Hoya iSert 250, power 22.0 D, SN [id number].

## 2013-12-29 ENCOUNTER — Encounter (HOSPITAL_COMMUNITY): Payer: Self-pay | Admitting: Ophthalmology

## 2014-01-04 DIAGNOSIS — H2510 Age-related nuclear cataract, unspecified eye: Secondary | ICD-10-CM | POA: Diagnosis not present

## 2014-01-05 ENCOUNTER — Other Ambulatory Visit (HOSPITAL_COMMUNITY): Payer: Self-pay | Admitting: Family Medicine

## 2014-01-05 DIAGNOSIS — Z1231 Encounter for screening mammogram for malignant neoplasm of breast: Secondary | ICD-10-CM

## 2014-01-05 MED ORDER — FENTANYL CITRATE 0.05 MG/ML IJ SOLN: 25.0000 ug | INTRAMUSCULAR | Status: DC | PRN

## 2014-01-05 MED ORDER — ONDANSETRON HCL 4 MG/2ML IJ SOLN: 4.0000 mg | Freq: Once | INTRAMUSCULAR | Status: AC | PRN

## 2014-01-05 NOTE — Patient Instructions (Signed)
Kaitlyn Sanchez  01/05/2014   Your procedure is scheduled on:  01/13/2014  Report to [facility] at  25 AM.  Call this number if you have problems the morning of surgery: [phone]   Remember:   Do not eat food or drink liquids after midnight.   Take these medicines the morning of surgery with A SIP OF WATER: maxzide, effexor   Do not wear jewelry, make-up or nail polish.  Do not wear lotions, powders, or perfumes.   Do not shave 48 hours prior to surgery. Men may shave face and neck.  Do not bring valuables to the hospital.  [facility] is not responsible for any belongings or valuables.               Contacts, dentures or bridgework may not be worn into surgery.  Leave suitcase in the car. After surgery it may be brought to your room.  For patients admitted to the hospital, discharge time is determined by your treatment team.               Patients discharged the day of surgery will not be allowed to drive home.  Name and phone number of your driver: family  Special Instructions: Shower using CHG 2 nights before surgery and the night before surgery.  If you shower the day of surgery use CHG.  Use special wash - you have one bottle of CHG for all showers.  You should use approximately 1/3 of the bottle for each shower.   Please read over the following fact sheets that you were given: Pain Booklet, Coughing and Deep Breathing, Surgical Site Infection Prevention, Anesthesia Post-op Instructions and Care and Recovery After Surgery Ovarian Cystectomy Ovarian cystectomy is surgery to remove a fluid-filled sac (cyst) on an ovary. The ovaries are small organs that produce eggs in women. Various types of cysts can form on the ovaries. Most are not cancerous. Surgery may be done if a cyst is large or is causing symptoms such as pain. It may also be done for a cyst that is or might be cancerous. This surgery can be done using a laparoscopic technique or an open abdominal technique. The  laparoscopic technique involves smaller cuts (incisions) and a faster recovery time. The technique used will depend on your age, the type of cyst, and whether the cyst is cancerous. The laparoscopic technique is not used for a cancerous cyst. LET [facility] CARE PROVIDER KNOW ABOUT:   Any allergies you have.  All medicines you are taking, including vitamins, herbs, eye drops, creams, and over-the-counter medicines.  Previous problems you or members of your family have had with the use of anesthetics.  Any blood disorders you have.  Previous surgeries you have had.  Medical conditions you have.  Any chance you might be pregnant. RISKS AND COMPLICATIONS Generally, this is a safe procedure. However, as with any procedure, complications can occur. Possible complications include:  Excessive bleeding.  Infection.  Injury to other organs.  Blood clots.  Becoming incapable of getting pregnant (infertile). BEFORE THE PROCEDURE  Ask your health care provider about changing or stopping any regular medicines. Avoid taking aspirin, ibuprofen, or blood thinners as directed by your health care provider.  Do not eat or drink anything after midnight the night before surgery.  If you smoke, do not smoke for at least 2 weeks before your surgery.  Do not drink alcohol the day before your surgery.  Let your health care  provider know if you develop a cold or any infection before your surgery.  Arrange for someone to drive you home after the procedure or after your hospital stay. Also arrange for someone to help you with activities during recovery. PROCEDURE  Either a laparoscopic technique or an open abdominal technique may be used for this surgery.  Small monitors will be put on your body. They are used to check your heart, blood pressure, and oxygen level.   An IV access tube will be put into one of your veins. Medicine will be able to flow directly into your body through this IV tube.    You might be given a medicine to help you relax (sedative).   You will be given a medicine to make you sleep (general anesthetic). A breathing tube may be placed into your lungs during the procedure. Laparoscopic Technique  Several small cuts (incisions) are made in your abdomen. These are typically about 1 to 2 cm long.   Your abdomen will be filled with carbon dioxide gas so that it expands. This gives the surgeon more room to operate and makes your organs easier to see.   A thin, lighted tube with a tiny camera on the end (laparoscope) is put through one of the small incisions. The camera on the laparoscope sends a picture to a TV screen in the operating room. This gives the surgeon a good view inside your abdomen.   Hollow tubes are put through the other small incisions in your abdomen. The tools needed for the procedure are put through these tubes.  The ovary with the cyst is identified, and the cyst is removed. It is sent to the lab for testing. If it is cancer, both ovaries may need to be removed during a different surgery.  Tools are removed. The incisions are then closed with stitches or skin glue, and dressings may be applied. Open Abdominal Technique  A single large incision is made along your bikini line or in the middle of your lower abdomen.  The ovary with the cyst is identified, and the cyst is removed. It is sent to the lab for testing. If it is cancer, both ovaries may need to be removed during a different surgery.  The incision is then closed with stitches or staples. AFTER THE PROCEDURE   You will wake up from anesthesia and be taken to a recovery area.  If you had laparoscopic surgery, you may be able to go home the same day, or you may need to stay in the hospital overnight.  If you had open abdominal surgery, you will need to stay in the hospital for a few days.  Your IV access tube and catheter will be removed the first or second day, after you are  able to eat and drink enough.  You may be given medicine to relieve pain or to help you sleep.  You may be given an antibiotic medicine if needed. Document Released: 07/22/2007 Document Revised: 07/15/2013 Document Reviewed: 05/06/2013 [facility] Patient Information 2014 [place]. PATIENT INSTRUCTIONS POST-ANESTHESIA  IMMEDIATELY FOLLOWING SURGERY:  Do not drive or operate machinery for the first twenty four hours after surgery.  Do not make any important decisions for twenty four hours after surgery or while taking narcotic pain medications or sedatives.  If you develop intractable nausea and vomiting or a severe headache please notify your doctor immediately.  FOLLOW-UP:  Please make an appointment with your surgeon as instructed. You do not need to follow up with  anesthesia unless specifically instructed to do so.  WOUND CARE INSTRUCTIONS (if applicable):  Keep a dry clean dressing on the anesthesia/puncture wound site if there is drainage.  Once the wound has quit draining you may leave it open to air.  Generally you should leave the bandage intact for twenty four hours unless there is drainage.  If the epidural site drains for more than 36-48 hours please call the anesthesia department.  QUESTIONS?:  Please feel free to call your physician or the hospital operator if you have any questions, and they will be happy to assist you.

## 2014-01-06 ENCOUNTER — Encounter (HOSPITAL_COMMUNITY)
Admission: RE | Admit: 2014-01-06 | Discharge: 2014-01-06 | Disposition: A | Discharge status: Home / Self Care | Payer: Medicare Other | Source: Ambulatory Visit | Attending: Ophthalmology | Admitting: Ophthalmology

## 2014-01-06 ENCOUNTER — Encounter (HOSPITAL_COMMUNITY): Payer: Self-pay

## 2014-01-06 MED ORDER — LIDOCAINE HCL (PF) 1 % IJ SOLN
INTRAMUSCULAR | Status: AC
  Filled 2014-01-06: qty 2

## 2014-01-06 MED ORDER — NEOMYCIN-POLYMYXIN-DEXAMETH 3.5-10000-0.1 OP SUSP
OPHTHALMIC | Status: AC
  Filled 2014-01-06: qty 5

## 2014-01-06 MED ORDER — PHENYLEPHRINE HCL 2.5 % OP SOLN
OPHTHALMIC | Status: AC
  Filled 2014-01-06: qty 15

## 2014-01-06 MED ORDER — TETRACAINE HCL 0.5 % OP SOLN
OPHTHALMIC | Status: AC
  Filled 2014-01-06: qty 2

## 2014-01-06 MED ORDER — CYCLOPENTOLATE-PHENYLEPHRINE OP SOLN OPTIME - NO CHARGE
OPHTHALMIC | Status: AC
  Filled 2014-01-06: qty 2

## 2014-01-06 MED ORDER — LIDOCAINE HCL 3.5 % OP GEL
OPHTHALMIC | Status: AC
  Filled 2014-01-06: qty 1

## 2014-01-07 ENCOUNTER — Encounter (HOSPITAL_COMMUNITY): Payer: Self-pay | Admitting: Ophthalmology

## 2014-01-07 ENCOUNTER — Encounter (HOSPITAL_COMMUNITY)
Admission: RE | Disposition: A | Discharge status: Home / Self Care | Payer: Self-pay | Source: Ambulatory Visit | Attending: Ophthalmology

## 2014-01-07 ENCOUNTER — Ambulatory Visit (HOSPITAL_COMMUNITY): Payer: Medicare Other | Admitting: Anesthesiology

## 2014-01-07 ENCOUNTER — Ambulatory Visit (HOSPITAL_COMMUNITY)
Admission: RE | Admit: 2014-01-07 | Discharge: 2014-01-07 | Disposition: A | Discharge status: Home / Self Care | Payer: Medicare Other | Source: Ambulatory Visit | Attending: Ophthalmology | Admitting: Ophthalmology

## 2014-01-07 ENCOUNTER — Encounter (HOSPITAL_COMMUNITY): Payer: Medicare Other | Admitting: Anesthesiology

## 2014-01-07 DIAGNOSIS — I10 Essential (primary) hypertension: Secondary | ICD-10-CM | POA: Diagnosis not present

## 2014-01-07 DIAGNOSIS — Z7982 Long term (current) use of aspirin: Secondary | ICD-10-CM | POA: Diagnosis not present

## 2014-01-07 DIAGNOSIS — Z79899 Other long term (current) drug therapy: Secondary | ICD-10-CM | POA: Insufficient documentation

## 2014-01-07 DIAGNOSIS — H2510 Age-related nuclear cataract, unspecified eye: Secondary | ICD-10-CM | POA: Diagnosis not present

## 2014-01-07 DIAGNOSIS — H269 Unspecified cataract: Secondary | ICD-10-CM | POA: Diagnosis not present

## 2014-01-07 DIAGNOSIS — K219 Gastro-esophageal reflux disease without esophagitis: Secondary | ICD-10-CM | POA: Diagnosis not present

## 2014-01-07 HISTORY — PX: CATARACT EXTRACTION W/PHACO: SHX586

## 2014-01-07 SURGERY — PHACOEMULSIFICATION, CATARACT, WITH IOL INSERTION
Anesthesia: Monitor Anesthesia Care | Site: Eye | Laterality: Right

## 2014-01-07 MED ORDER — EPINEPHRINE HCL 1 MG/ML IJ SOLN
INTRAOCULAR | Status: DC | PRN
  Administered 2014-01-07: 11:00:00

## 2014-01-07 MED ORDER — LACTATED RINGERS IV SOLN
INTRAVENOUS | Status: DC
  Administered 2014-01-07: 1000 mL via INTRAVENOUS

## 2014-01-07 MED ORDER — LIDOCAINE HCL (PF) 1 % IJ SOLN
INTRAMUSCULAR | Status: DC | PRN
  Administered 2014-01-07: .3 mL

## 2014-01-07 MED ORDER — POVIDONE-IODINE 5 % OP SOLN
OPHTHALMIC | Status: DC | PRN
  Administered 2014-01-07: 1 via OPHTHALMIC

## 2014-01-07 MED ORDER — MIDAZOLAM HCL 2 MG/2ML IJ SOLN
INTRAMUSCULAR | Status: AC
  Filled 2014-01-07: qty 2

## 2014-01-07 MED ORDER — FENTANYL CITRATE 0.05 MG/ML IJ SOLN
INTRAMUSCULAR | Status: AC
  Filled 2014-01-07: qty 2

## 2014-01-07 MED ORDER — NEOMYCIN-POLYMYXIN-DEXAMETH 3.5-10000-0.1 OP SUSP
OPHTHALMIC | Status: DC | PRN
  Administered 2014-01-07: 1 [drp] via OPHTHALMIC

## 2014-01-07 MED ORDER — LIDOCAINE 3.5 % OP GEL OPTIME - NO CHARGE
OPHTHALMIC | Status: DC | PRN
  Administered 2014-01-07: 1 [drp] via OPHTHALMIC

## 2014-01-07 MED ORDER — LIDOCAINE HCL 3.5 % OP GEL
1.0000 "application " | Freq: Once | OPHTHALMIC | Status: AC
  Administered 2014-01-07: 1 via OPHTHALMIC

## 2014-01-07 MED ORDER — TETRACAINE HCL 0.5 % OP SOLN
1.0000 [drp] | OPHTHALMIC | Status: AC
  Administered 2014-01-07 (×3): 1 [drp] via OPHTHALMIC

## 2014-01-07 MED ORDER — PROVISC 10 MG/ML IO SOLN
INTRAOCULAR | Status: DC | PRN
  Administered 2014-01-07: 0.85 mL via INTRAOCULAR

## 2014-01-07 MED ORDER — BSS IO SOLN
INTRAOCULAR | Status: DC | PRN
  Administered 2014-01-07: 15 mL via INTRAOCULAR

## 2014-01-07 MED ORDER — PHENYLEPHRINE HCL 2.5 % OP SOLN
1.0000 [drp] | OPHTHALMIC | Status: AC
  Administered 2014-01-07 (×3): 1 [drp] via OPHTHALMIC

## 2014-01-07 MED ORDER — MIDAZOLAM HCL 2 MG/2ML IJ SOLN
1.0000 mg | INTRAMUSCULAR | Status: DC | PRN
  Administered 2014-01-07: 2 mg via INTRAVENOUS

## 2014-01-07 MED ORDER — CYCLOPENTOLATE-PHENYLEPHRINE 0.2-1 % OP SOLN
1.0000 [drp] | OPHTHALMIC | Status: AC
  Administered 2014-01-07 (×3): 1 [drp] via OPHTHALMIC

## 2014-01-07 MED ORDER — EPINEPHRINE HCL 1 MG/ML IJ SOLN
INTRAMUSCULAR | Status: AC
  Filled 2014-01-07: qty 1

## 2014-01-07 MED ORDER — FENTANYL CITRATE 0.05 MG/ML IJ SOLN
25.0000 ug | INTRAMUSCULAR | Status: AC
  Administered 2014-01-07 (×2): 25 ug via INTRAVENOUS

## 2014-01-07 SURGICAL SUPPLY — 33 items
CAPSULAR TENSION RING-AMO (OPHTHALMIC RELATED) IMPLANT
CLOTH BEACON ORANGE TIMEOUT ST (SAFETY) ×3 IMPLANT
EYE SHIELD UNIVERSAL CLEAR (GAUZE/BANDAGES/DRESSINGS) ×3 IMPLANT
GLOVE BIO SURGEON STRL SZ 6.5 (GLOVE) IMPLANT
GLOVE BIO SURGEONS STRL SZ 6.5 (GLOVE)
GLOVE BIOGEL PI IND STRL 6.5 (GLOVE) ×1 IMPLANT
GLOVE BIOGEL PI IND STRL 7.0 (GLOVE) IMPLANT
GLOVE BIOGEL PI IND STRL 7.5 (GLOVE) IMPLANT
GLOVE BIOGEL PI INDICATOR 6.5 (GLOVE) ×2
GLOVE BIOGEL PI INDICATOR 7.0 (GLOVE)
GLOVE BIOGEL PI INDICATOR 7.5 (GLOVE)
GLOVE ECLIPSE 6.5 STRL STRAW (GLOVE) IMPLANT
GLOVE ECLIPSE 7.0 STRL STRAW (GLOVE) IMPLANT
GLOVE ECLIPSE 7.5 STRL STRAW (GLOVE) IMPLANT
GLOVE EXAM NITRILE LRG STRL (GLOVE) IMPLANT
GLOVE EXAM NITRILE MD LF STRL (GLOVE) ×3 IMPLANT
GLOVE SKINSENSE NS SZ6.5 (GLOVE)
GLOVE SKINSENSE NS SZ7.0 (GLOVE)
GLOVE SKINSENSE STRL SZ6.5 (GLOVE) IMPLANT
GLOVE SKINSENSE STRL SZ7.0 (GLOVE) IMPLANT
KIT VITRECTOMY (OPHTHALMIC RELATED) IMPLANT
PAD ARMBOARD 7.5X6 YLW CONV (MISCELLANEOUS) ×3 IMPLANT
PROC W NO LENS (INTRAOCULAR LENS)
PROC W SPEC LENS (INTRAOCULAR LENS)
PROCESS W NO LENS (INTRAOCULAR LENS) IMPLANT
PROCESS W SPEC LENS (INTRAOCULAR LENS) IMPLANT
RING MALYGIN (MISCELLANEOUS) IMPLANT
SIGHTPATH CAT PROC W REG LENS (Ophthalmic Related) ×3 IMPLANT
SYR TB 1ML LL NO SAFETY (SYRINGE) ×3 IMPLANT
TAPE SURG TRANSPORE 1 IN (GAUZE/BANDAGES/DRESSINGS) ×1 IMPLANT
TAPE SURGICAL TRANSPORE 1 IN (GAUZE/BANDAGES/DRESSINGS) ×2
VISCOELASTIC ADDITIONAL (OPHTHALMIC RELATED) IMPLANT
WATER STERILE IRR 250ML POUR (IV SOLUTION) ×3 IMPLANT

## 2014-01-07 NOTE — Anesthesia Postprocedure Evaluation (Signed)
  Anesthesia Post-op Note  Patient: Kaitlyn Sanchez  Procedure(s) Performed: Procedure(s) with comments: CATARACT EXTRACTION PHACO AND INTRAOCULAR LENS PLACEMENT (IOC) (Right) - CDE:11.01  Patient Location: Short Stay  Anesthesia Type:MAC  Level of Consciousness: awake, alert , oriented and patient cooperative  Airway and Oxygen Therapy: Patient Spontanous Breathing  Post-op Pain: none  Post-op Assessment: Post-op Vital signs reviewed, Patient's Cardiovascular Status Stable, Respiratory Function Stable, Patent Airway and Pain level controlled  Post-op Vital Signs: Reviewed and stable  Complications: No apparent anesthesia complications

## 2014-01-07 NOTE — Discharge Instructions (Signed)

## 2014-01-07 NOTE — Anesthesia Preprocedure Evaluation (Signed)
Anesthesia Evaluation  Patient identified by MRN, date of birth, ID band Patient awake    Reviewed: Allergy & Precautions  Airway Mallampati: II TM Distance: >3 FB Neck ROM: Full    Dental  (+) Teeth Intact   Pulmonary neg pulmonary ROS,  breath sounds clear to auscultation        Cardiovascular hypertension, Pt. on medications Rhythm:Regular Rate:Normal     Neuro/Psych    GI/Hepatic GERD-  Medicated and Controlled,  Endo/Other  Hypothyroidism   Renal/GU      Musculoskeletal   Abdominal   Peds  Hematology   Anesthesia Other Findings   Reproductive/Obstetrics                           Anesthesia Physical Anesthesia Plan  ASA: II  Anesthesia Plan: MAC   Post-op Pain Management:    Induction: Intravenous  Airway Management Planned: Nasal Cannula  Additional Equipment:   Intra-op Plan:   Post-operative Plan:   Informed Consent: I have reviewed the patients History and Physical, chart, labs and discussed the procedure including the risks, benefits and alternatives for the proposed anesthesia with the patient or authorized representative who has indicated his/her understanding and acceptance.     Plan Discussed with:   Anesthesia Plan Comments:         Anesthesia Quick Evaluation

## 2014-01-07 NOTE — Transfer of Care (Signed)
Immediate Anesthesia Transfer of Care Note  Patient: Kaitlyn Sanchez  Procedure(s) Performed: Procedure(s) with comments: CATARACT EXTRACTION PHACO AND INTRAOCULAR LENS PLACEMENT (IOC) (Right) - CDE:11.01  Patient Location: Short Stay  Anesthesia Type:MAC  Level of Consciousness: awake, alert , oriented and patient cooperative  Airway & Oxygen Therapy: Patient Spontanous Breathing  Post-op Assessment: Report given to PACU RN and Post -op Vital signs reviewed and stable  Post vital signs: Reviewed and stable  Complications: No apparent anesthesia complications

## 2014-01-07 NOTE — H&P (Signed)
I have reviewed the H&P, the patient was re-examined, and I have identified no interval changes in medical condition and plan of care since the history and physical of record  

## 2014-01-07 NOTE — Op Note (Signed)
Date of Admission: 01/07/2014  Date of Surgery: 01/07/2014   Pre-Op Dx: Cataract Right Eye  Post-Op Dx: Nuclear Cataract Right  Eye,  Dx Code 366.16  Surgeon: Tonny Branch, M.D.  Assistants: None  Anesthesia: Topical with MAC  Indications: Painless, progressive loss of vision with compromise of daily activities.  Surgery: Cataract Extraction with Intraocular lens Implant Right Eye  Discription: The patient had dilating drops and viscous lidocaine placed into the Right eye in the pre-op holding area. After transfer to the operating room, a time out was performed. The patient was then prepped and draped. Beginning with a 44 degree blade a paracentesis port was made at the surgeon's 2 o'clock position. The anterior chamber was then filled with 1% non-preserved lidocaine. This was followed by filling the anterior chamber with Provisc.  A 2.77mm keratome blade was used to make a clear corneal incision at the temporal limbus.  A bent cystatome needle was used to create a continuous tear capsulotomy. Hydrodissection was performed with balanced salt solution on a Fine canula. The lens nucleus was then removed using the phacoemulsification handpiece. Residual cortex was removed with the I&A handpiece. The anterior chamber and capsular bag were refilled with Provisc. A posterior chamber intraocular lens was placed into the capsular bag with it's injector. The implant was positioned with the Kuglan hook. The Provisc was then removed from the anterior chamber and capsular bag with the I&A handpiece. Stromal hydration of the main incision and paracentesis port was performed with BSS on a Fine canula. The wounds were tested for leak which was negative. The patient tolerated the procedure well. There were no operative complications. The patient was then transferred to the recovery room in stable condition.  Complications: None  Specimen: None  EBL: None  Prosthetic device: Hoya iSert 250, power 22.0 D, SN  [id number].

## 2014-01-12 ENCOUNTER — Encounter (HOSPITAL_COMMUNITY): Payer: Self-pay | Admitting: Ophthalmology

## 2014-01-22 DIAGNOSIS — H2589 Other age-related cataract: Secondary | ICD-10-CM | POA: Diagnosis not present

## 2014-02-01 ENCOUNTER — Ambulatory Visit (HOSPITAL_COMMUNITY)
Admission: RE | Admit: 2014-02-01 | Discharge: 2014-02-01 | Disposition: A | Discharge status: Home / Self Care | Payer: Medicare Other | Source: Ambulatory Visit | Attending: Family Medicine | Admitting: Family Medicine

## 2014-02-01 DIAGNOSIS — Z1231 Encounter for screening mammogram for malignant neoplasm of breast: Secondary | ICD-10-CM

## 2014-03-02 DIAGNOSIS — E785 Hyperlipidemia, unspecified: Secondary | ICD-10-CM | POA: Diagnosis not present

## 2014-03-02 DIAGNOSIS — IMO0002 Reserved for concepts with insufficient information to code with codable children: Secondary | ICD-10-CM | POA: Diagnosis not present

## 2014-03-02 DIAGNOSIS — Z0000 Encounter for general adult medical examination without abnormal findings: Secondary | ICD-10-CM | POA: Diagnosis not present

## 2014-03-02 DIAGNOSIS — I10 Essential (primary) hypertension: Secondary | ICD-10-CM | POA: Diagnosis not present

## 2014-03-02 DIAGNOSIS — Z23 Encounter for immunization: Secondary | ICD-10-CM | POA: Diagnosis not present

## 2014-05-04 DIAGNOSIS — E890 Postprocedural hypothyroidism: Secondary | ICD-10-CM | POA: Diagnosis not present

## 2014-05-19 DIAGNOSIS — M9981 Other biomechanical lesions of cervical region: Secondary | ICD-10-CM | POA: Diagnosis not present

## 2014-05-19 DIAGNOSIS — M4712 Other spondylosis with myelopathy, cervical region: Secondary | ICD-10-CM | POA: Diagnosis not present

## 2014-05-20 DIAGNOSIS — M4712 Other spondylosis with myelopathy, cervical region: Secondary | ICD-10-CM | POA: Diagnosis not present

## 2014-05-20 DIAGNOSIS — M9981 Other biomechanical lesions of cervical region: Secondary | ICD-10-CM | POA: Diagnosis not present

## 2014-05-24 DIAGNOSIS — M4712 Other spondylosis with myelopathy, cervical region: Secondary | ICD-10-CM | POA: Diagnosis not present

## 2014-05-24 DIAGNOSIS — M9981 Other biomechanical lesions of cervical region: Secondary | ICD-10-CM | POA: Diagnosis not present

## 2014-05-27 DIAGNOSIS — M4712 Other spondylosis with myelopathy, cervical region: Secondary | ICD-10-CM | POA: Diagnosis not present

## 2014-05-27 DIAGNOSIS — M9981 Other biomechanical lesions of cervical region: Secondary | ICD-10-CM | POA: Diagnosis not present

## 2014-06-02 DIAGNOSIS — M4712 Other spondylosis with myelopathy, cervical region: Secondary | ICD-10-CM | POA: Diagnosis not present

## 2014-06-02 DIAGNOSIS — M9981 Other biomechanical lesions of cervical region: Secondary | ICD-10-CM | POA: Diagnosis not present

## 2014-06-09 DIAGNOSIS — M4712 Other spondylosis with myelopathy, cervical region: Secondary | ICD-10-CM | POA: Diagnosis not present

## 2014-06-09 DIAGNOSIS — M9981 Other biomechanical lesions of cervical region: Secondary | ICD-10-CM | POA: Diagnosis not present

## 2014-06-23 DIAGNOSIS — M4712 Other spondylosis with myelopathy, cervical region: Secondary | ICD-10-CM | POA: Diagnosis not present

## 2014-06-23 DIAGNOSIS — M9981 Other biomechanical lesions of cervical region: Secondary | ICD-10-CM | POA: Diagnosis not present

## 2014-07-07 DIAGNOSIS — M4712 Other spondylosis with myelopathy, cervical region: Secondary | ICD-10-CM | POA: Diagnosis not present

## 2014-07-07 DIAGNOSIS — M9981 Other biomechanical lesions of cervical region: Secondary | ICD-10-CM | POA: Diagnosis not present

## 2014-07-09 DIAGNOSIS — M810 Age-related osteoporosis without current pathological fracture: Secondary | ICD-10-CM | POA: Diagnosis not present

## 2014-07-09 DIAGNOSIS — K219 Gastro-esophageal reflux disease without esophagitis: Secondary | ICD-10-CM | POA: Diagnosis not present

## 2014-07-09 DIAGNOSIS — M1991 Primary osteoarthritis, unspecified site: Secondary | ICD-10-CM | POA: Diagnosis not present

## 2014-07-09 DIAGNOSIS — Z23 Encounter for immunization: Secondary | ICD-10-CM | POA: Diagnosis not present

## 2014-07-09 DIAGNOSIS — E063 Autoimmune thyroiditis: Secondary | ICD-10-CM | POA: Diagnosis not present

## 2014-07-09 DIAGNOSIS — F419 Anxiety disorder, unspecified: Secondary | ICD-10-CM | POA: Diagnosis not present

## 2014-07-09 DIAGNOSIS — E782 Mixed hyperlipidemia: Secondary | ICD-10-CM | POA: Diagnosis not present

## 2014-07-09 DIAGNOSIS — Z6824 Body mass index (BMI) 24.0-24.9, adult: Secondary | ICD-10-CM | POA: Diagnosis not present

## 2014-08-04 DIAGNOSIS — M9901 Segmental and somatic dysfunction of cervical region: Secondary | ICD-10-CM | POA: Diagnosis not present

## 2014-08-04 DIAGNOSIS — M47812 Spondylosis without myelopathy or radiculopathy, cervical region: Secondary | ICD-10-CM | POA: Diagnosis not present

## 2014-08-23 ENCOUNTER — Other Ambulatory Visit (HOSPITAL_COMMUNITY): Payer: Self-pay | Admitting: Family Medicine

## 2014-08-23 DIAGNOSIS — E063 Autoimmune thyroiditis: Secondary | ICD-10-CM | POA: Diagnosis not present

## 2014-08-23 DIAGNOSIS — R1011 Right upper quadrant pain: Secondary | ICD-10-CM | POA: Diagnosis not present

## 2014-08-23 DIAGNOSIS — R1013 Epigastric pain: Secondary | ICD-10-CM | POA: Diagnosis not present

## 2014-08-23 DIAGNOSIS — M810 Age-related osteoporosis without current pathological fracture: Secondary | ICD-10-CM | POA: Diagnosis not present

## 2014-08-23 DIAGNOSIS — Z6824 Body mass index (BMI) 24.0-24.9, adult: Secondary | ICD-10-CM | POA: Diagnosis not present

## 2014-08-25 ENCOUNTER — Encounter: Payer: Self-pay | Admitting: Gastroenterology

## 2014-08-30 ENCOUNTER — Ambulatory Visit (HOSPITAL_COMMUNITY)
Admission: RE | Admit: 2014-08-30 | Discharge: 2014-08-30 | Disposition: A | Discharge status: Home / Self Care | Payer: Medicare Other | Source: Ambulatory Visit | Attending: Family Medicine | Admitting: Family Medicine

## 2014-08-30 DIAGNOSIS — R1013 Epigastric pain: Secondary | ICD-10-CM | POA: Insufficient documentation

## 2014-08-30 DIAGNOSIS — R1011 Right upper quadrant pain: Secondary | ICD-10-CM

## 2014-09-07 DIAGNOSIS — E890 Postprocedural hypothyroidism: Secondary | ICD-10-CM | POA: Diagnosis not present

## 2014-10-07 ENCOUNTER — Ambulatory Visit (INDEPENDENT_AMBULATORY_CARE_PROVIDER_SITE_OTHER): Payer: Medicare Other | Admitting: Gastroenterology

## 2014-10-07 ENCOUNTER — Encounter: Payer: Self-pay | Admitting: Gastroenterology

## 2014-10-07 ENCOUNTER — Other Ambulatory Visit: Payer: Self-pay

## 2014-10-07 VITALS — BP 134/73 | HR 59 | Temp 97.4°F | Ht 62.0 in | Wt 133.8 lb

## 2014-10-07 DIAGNOSIS — R1013 Epigastric pain: Secondary | ICD-10-CM | POA: Diagnosis not present

## 2014-10-07 NOTE — Progress Notes (Signed)
Primary Care Physician:  Purvis Kilts, MD Primary Gastroenterologist:  Dr. Gala Romney   Chief Complaint  Patient presents with  . Abdominal Pain    right side under breast    HPI:   Kaitlyn Sanchez is a 79 y.o. female presenting today at the request of Dr. Hilma Favors secondary to abdominal pain.   Feels "icky". Has had several episodes of pain in upper abdomen, feels like something is twisting. What she eats doesn't seem to suit her. No vomiting. Pain in RUQ now new. Symptoms for several years, feels like someone is "squeezing you to death in tummy". Exacerbated by food occasionally. RUQ pain radiates to middle. Gallbladder remains in situ. No weight loss. Aspirin 325 mg each morning. Prilosec 40 mg daily for at least 3-4 months. Hasn't noticed much improvement.   Takes probiotic and stool softener, which keeps her on a good bowel regimen. Last colonoscopy several years ago at [facility] per patient, but I do not see this in epic. Per her reports, this was normal. No concerning lower GI symptoms. An [place] of abdomen  Nov 2015: negative gallstones, biliary system mildly dilated up to 8.2. Outside labs have been requested.   Past Medical History  Diagnosis Date  . Hypertension   . Hypothyroidism   . GERD (gastroesophageal reflux disease)   . Hypercholesterolemia     Past Surgical History  Procedure Laterality Date  . Lumbar laminectomy      X 2  . Appendectomy    . Tonsillectomy    . Cataract extraction w/phaco Left 12/28/2013    Procedure: CATARACT EXTRACTION PHACO AND INTRAOCULAR LENS PLACEMENT (IOC);  Surgeon: Tonny Branch, MD;  Location: [facility];  Service: Ophthalmology;  Laterality: Left;  CDE 18.64  . Cataract extraction w/phaco Right 01/07/2014    Procedure: CATARACT EXTRACTION PHACO AND INTRAOCULAR LENS PLACEMENT (IOC);  Surgeon: Tonny Branch, MD;  Location: [facility];  Service: Ophthalmology;  Laterality: Right;  CDE:11.01  . Colonoscopy    . Carpal tunnel release      Current  Outpatient Prescriptions  Medication Sig Dispense Refill  . acidophilus (RISAQUAD) CAPS capsule Take 1 capsule by mouth daily.    Marland Kitchen amLODipine (NORVASC) 10 MG tablet Take 5 mg by mouth 2 (two) times daily.    Marland Kitchen aspirin EC 325 MG tablet Take 325 mg by mouth daily.    Marland Kitchen docusate sodium (COLACE) 100 MG capsule Take 100 mg by mouth 3 (three) times daily.    . Flaxseed, Linseed, (FLAXSEED OIL) 1000 MG CAPS Take 1,000 mg by mouth 2 (two) times daily.    Marland Kitchen glucosamine-chondroitin 500-400 MG tablet Take 1 tablet by mouth daily.    . hydrochlorothiazide (HYDRODIURIL) 25 MG tablet Take 25 mg by mouth daily.    Marland Kitchen levothyroxine (SYNTHROID, LEVOTHROID) 112 MCG tablet Take 100 mcg by mouth daily before breakfast.     . LORazepam (ATIVAN) 1 MG tablet Take 0.5 mg by mouth at bedtime as needed for anxiety.    . multivitamin-iron-minerals-folic acid (CENTRUM) chewable tablet Chew 1 tablet by mouth daily.    Marland Kitchen olmesartan (BENICAR) 20 MG tablet Take 20 mg by mouth daily.    . Omega 3 1200 MG CAPS Take 1,200 mg by mouth 2 (two) times daily.    Marland Kitchen omeprazole (PRILOSEC) 20 MG capsule Take 40 mg by mouth daily.     . potassium gluconate 595 MG TABS tablet Take 595 mg by mouth daily.    Marland Kitchen Soy  Isoflavones 40 MG TABS Take 40 mg by mouth daily.    . vitamin B-12 (CYANOCOBALAMIN) 500 MCG tablet Take 500 mcg by mouth daily.     No current facility-administered medications for this visit.    Allergies as of 10/07/2014  . (No Known Allergies)    Family History  Problem Relation Age of Onset  . Colon cancer      no first degree relatives  . Renal cancer Father     History   Social History  . Marital Status: Widowed    Spouse Name: N/A    Number of Children: N/A  . Years of Education: N/A   Occupational History  . retired     Cabin crew in [place] History Main Topics  . Smoking status: Never Smoker   . Smokeless tobacco: Not on file  . Alcohol Use: No  . Drug Use: No  . Sexual  Activity: Not on file   Other Topics Concern  . Not on file   Social History Narrative    Review of Systems: Negative as mentioned in HPI  Physical Exam: BP 134/73 mmHg  Pulse 59  Temp(Src) 97.4 F (36.3 C) (Oral)  Ht 5\' 2"  (1.575 m)  Wt 133 lb 12.8 oz (60.691 kg)  BMI 24.47 kg/m2 General:   Alert and oriented. Pleasant and cooperative. Well-nourished and well-developed.  Head:  Normocephalic and atraumatic. Eyes:  Without icterus, sclera clear and conjunctiva pink.  Ears:  Normal auditory acuity. Nose:  No deformity, discharge,  or lesions. Mouth:  No deformity or lesions, oral mucosa pink.  Lungs:  Clear to auscultation bilaterally. No wheezes, rales, or rhonchi. No distress.  Heart:  S1, S2 present without murmurs appreciated.  Abdomen:  +BS, soft, non-tender and non-distended. No HSM noted. No guarding or rebound. No masses appreciated.  Rectal:  Deferred  Msk:  Symmetrical without gross deformities. Normal posture. Extremities:  Without  edema. Neurologic:  Alert and  oriented x4;  grossly normal neurologically. Skin:  Intact without significant lesions or rashes. Psych:  Alert and cooperative. Normal mood and affect.   US abdomen Nov 2015:  Negative for gallstones, biliary system is mildly dilated, measuring up to 8.2 mm.

## 2014-10-07 NOTE — Patient Instructions (Signed)
We have set you up for an upper endoscopy with Dr. Gala Romney in the near future.  I have also requested the blood work from Dr. Hilma Favors to see what we need to investigate regarding the dilated bile duct on your ultrasound.

## 2014-10-10 NOTE — Assessment & Plan Note (Signed)
80 year old female with chronic upper abdominal pain, nausea, decreased appetite, and now with RUQ discomfort intermittently. Gallbladder remains in situ with US abdomen Nov 2015 negative for gallstones but CBD mildly dilated to 8.2 mm. LFTs have been requested from PCP. No significant improvement with Prilosec 40 mg daily; she takes only an aspirin 325 mg daily but no other NSAIDs. As she has not had an EGD, would recommend this as an initial step to evaluate for gastritis, PUD; I also discussed with patient the possibility of biliary etiology. Although no CBD stones noted on US abdomen, may need further imaging (MRI/MRCP) to assess for any occult process. In interim, obtain outside LFTs. Pursue EGD as initial evaluation.   Proceed with upper endoscopy in the near future with Dr. Gala Romney. The risks, benefits, and alternatives have been discussed in detail with patient. They have stated understanding and desire to proceed.  Continue Prilosec 40 mg daily Obtain outside LFTs Anticipate further evaluation of biliary tree via MRI/MRCP after EGD Obtain outside colonoscopy reports (no concerning lower GI symptoms, no need for colonoscopy currently).

## 2014-10-11 ENCOUNTER — Encounter (HOSPITAL_COMMUNITY)
Admission: RE | Disposition: A | Discharge status: Home / Self Care | Payer: Self-pay | Source: Ambulatory Visit | Attending: Internal Medicine

## 2014-10-11 ENCOUNTER — Telehealth: Payer: Self-pay | Admitting: Internal Medicine

## 2014-10-11 ENCOUNTER — Encounter (HOSPITAL_COMMUNITY): Payer: Self-pay | Admitting: *Deleted

## 2014-10-11 ENCOUNTER — Ambulatory Visit (HOSPITAL_COMMUNITY)
Admission: RE | Admit: 2014-10-11 | Discharge: 2014-10-11 | Disposition: A | Discharge status: Home / Self Care | Payer: Medicare Other | Source: Ambulatory Visit | Attending: Internal Medicine | Admitting: Internal Medicine

## 2014-10-11 DIAGNOSIS — K219 Gastro-esophageal reflux disease without esophagitis: Secondary | ICD-10-CM | POA: Diagnosis not present

## 2014-10-11 DIAGNOSIS — R1013 Epigastric pain: Secondary | ICD-10-CM

## 2014-10-11 DIAGNOSIS — I10 Essential (primary) hypertension: Secondary | ICD-10-CM | POA: Insufficient documentation

## 2014-10-11 DIAGNOSIS — K229 Disease of esophagus, unspecified: Secondary | ICD-10-CM | POA: Insufficient documentation

## 2014-10-11 DIAGNOSIS — Z7982 Long term (current) use of aspirin: Secondary | ICD-10-CM | POA: Diagnosis not present

## 2014-10-11 DIAGNOSIS — K2270 Barrett's esophagus without dysplasia: Secondary | ICD-10-CM | POA: Diagnosis not present

## 2014-10-11 DIAGNOSIS — K222 Esophageal obstruction: Secondary | ICD-10-CM | POA: Insufficient documentation

## 2014-10-11 DIAGNOSIS — E780 Pure hypercholesterolemia: Secondary | ICD-10-CM | POA: Diagnosis not present

## 2014-10-11 DIAGNOSIS — K449 Diaphragmatic hernia without obstruction or gangrene: Secondary | ICD-10-CM | POA: Insufficient documentation

## 2014-10-11 DIAGNOSIS — E039 Hypothyroidism, unspecified: Secondary | ICD-10-CM | POA: Diagnosis not present

## 2014-10-11 DIAGNOSIS — K2289 Other specified disease of esophagus: Secondary | ICD-10-CM | POA: Insufficient documentation

## 2014-10-11 HISTORY — PX: ESOPHAGOGASTRODUODENOSCOPY: SHX5428

## 2014-10-11 LAB — HEPATIC FUNCTION PANEL
ALT: 16 U/L (ref 0–35)
AST: 14 U/L (ref 0–37)
Albumin: 3.4 g/dL — ABNORMAL LOW (ref 3.5–5.2)
Alkaline Phosphatase: 61 U/L (ref 39–117)
BILIRUBIN TOTAL: 0.5 mg/dL (ref 0.3–1.2)
Bilirubin, Direct: <0.1 mg/dL (ref 0.0–0.3)
TOTAL PROTEIN: 5.5 g/dL — AB (ref 6.0–8.3)

## 2014-10-11 LAB — LIPASE, BLOOD: LIPASE: 30 U/L (ref 11–59)

## 2014-10-11 SURGERY — EGD (ESOPHAGOGASTRODUODENOSCOPY)
Anesthesia: Moderate Sedation

## 2014-10-11 MED ORDER — MEPERIDINE HCL 100 MG/ML IJ SOLN
INTRAMUSCULAR | Status: DC | PRN
  Administered 2014-10-11: 50 mg via INTRAVENOUS

## 2014-10-11 MED ORDER — LIDOCAINE VISCOUS 2 % MT SOLN
OROMUCOSAL | Status: AC
  Filled 2014-10-11: qty 15

## 2014-10-11 MED ORDER — ONDANSETRON HCL 4 MG/2ML IJ SOLN
INTRAMUSCULAR | Status: DC | PRN
  Administered 2014-10-11: 4 mg via INTRAVENOUS

## 2014-10-11 MED ORDER — SODIUM CHLORIDE 0.9 % IV SOLN
INTRAVENOUS | Status: DC
  Administered 2014-10-11: 09:00:00 via INTRAVENOUS

## 2014-10-11 MED ORDER — LIDOCAINE VISCOUS 2 % MT SOLN
OROMUCOSAL | Status: DC | PRN
Start: 2014-10-11 — End: 2014-10-11
  Administered 2014-10-11: 4 mL via OROMUCOSAL

## 2014-10-11 MED ORDER — ONDANSETRON HCL 4 MG/2ML IJ SOLN
INTRAMUSCULAR | Status: AC
  Filled 2014-10-11: qty 2

## 2014-10-11 MED ORDER — MEPERIDINE HCL 100 MG/ML IJ SOLN
INTRAMUSCULAR | Status: AC
  Filled 2014-10-11: qty 2

## 2014-10-11 MED ORDER — MIDAZOLAM HCL 5 MG/5ML IJ SOLN
INTRAMUSCULAR | Status: DC | PRN
  Administered 2014-10-11: 1 mg via INTRAVENOUS
  Administered 2014-10-11: 2 mg via INTRAVENOUS

## 2014-10-11 MED ORDER — STERILE WATER FOR IRRIGATION IR SOLN
Status: DC | PRN
  Administered 2014-10-11: 09:00:00

## 2014-10-11 MED ORDER — MIDAZOLAM HCL 5 MG/5ML IJ SOLN
INTRAMUSCULAR | Status: AC
  Filled 2014-10-11: qty 10

## 2014-10-11 NOTE — Interval H&P Note (Signed)
History and Physical Interval Note:  10/11/2014 9:26 AM  Kaitlyn Sanchez  has presented today for surgery, with the diagnosis of dyspepsia  The various methods of treatment have been discussed with the patient and family. After consideration of risks, benefits and other options for treatment, the patient has consented to  Procedure(s) with comments: ESOPHAGOGASTRODUODENOSCOPY (EGD) (N/A) - 930am as a surgical intervention .  The patient's history has been reviewed, patient examined, no change in status, stable for surgery.  I have reviewed the patient's chart and labs.  Questions were answered to the patient's satisfaction.     Nicholaus Steinke  No change. EGD per plan. No dysphagia.The risks, benefits, limitations, alternatives and imponderables have been reviewed with the patient. Potential for esophageal dilation, biopsy, etc. have also been reviewed.  Questions have been answered. All parties agreeable.

## 2014-10-11 NOTE — Discharge Instructions (Addendum)
EGD Discharge instructions Please read the instructions outlined below and refer to this sheet in the next few weeks. These discharge instructions provide you with general information on caring for yourself after you leave the hospital. Your doctor may also give you specific instructions. While your treatment has been planned according to the most current medical practices available, unavoidable complications occasionally occur. If you have any problems or questions after discharge, please call your doctor. ACTIVITY  You may resume your regular activity but move at a slower pace for the next 24 hours.   Take frequent rest periods for the next 24 hours.   Walking will help expel (get rid of) the air and reduce the bloated feeling in your abdomen.   No driving for 24 hours (because of the anesthesia (medicine) used during the test).   You may shower.   Do not sign any important legal documents or operate any machinery for 24 hours (because of the anesthesia used during the test).  NUTRITION  Drink plenty of fluids.   You may resume your normal diet.   Begin with a light meal and progress to your normal diet.   Avoid alcoholic beverages for 24 hours or as instructed by your caregiver.  MEDICATIONS  You may resume your normal medications unless your caregiver tells you otherwise.  WHAT YOU CAN EXPECT TODAY  You may experience abdominal discomfort such as a feeling of fullness or gas pains.  FOLLOW-UP  Your doctor will discuss the results of your test with you.  SEEK IMMEDIATE MEDICAL ATTENTION IF ANY OF THE FOLLOWING OCCUR:  Excessive nausea (feeling sick to your stomach) and/or vomiting.   Severe abdominal pain and distention (swelling).   Trouble swallowing.   Temperature over 101 F (37.8 C).   Rectal bleeding or vomiting of blood.    MRCP to evaluate dilated bile duct  Hepatic profile and serum lipase today  Continue Prilosec 40 mg daily  Further  recommendations to follow pending review of pathology report

## 2014-10-11 NOTE — Telephone Encounter (Signed)
Christine from Endo called. RMR did this patient today and instructed that she have a MRCP evaluated for dilated bile duct.

## 2014-10-11 NOTE — Op Note (Addendum)
[facility] [address] [place], [address]   ENDOSCOPY PROCEDURE REPORT  PATIENT: Kaitlyn Sanchez, Kaitlyn Sanchez  MR#: [id number] BIRTHDATE: [birth year] , 79  yrs. old GENDER: female ENDOSCOPIST: R.  Garfield Cornea, MD FACP FACG REFERRED BY:  [name], M.D. PROCEDURE DATE:  Nov 05, 2014 PROCEDURE:  EGD w/ biopsy INDICATIONS:  epigastric and right upper quadrant abdominal pain. MEDICATIONS: Versed 3 mg IV and Demerol 50 mg IV in divided doses. Xylocaine gel orally.  Zofran 4 mg IV ASA CLASS:      Class II  CONSENT: The risks, benefits, limitations, alternatives and imponderables have been discussed.  The potential for biopsy, esophogeal dilation, etc. have also been reviewed.  Questions have been answered.  All parties agreeable.  Please see the history and physical in the medical record for more information.  DESCRIPTION OF PROCEDURE: After the risks benefits and alternatives of the procedure were thoroughly explained, informed consent was obtained.  The EG-[id number] ([id number]) endoscope was introduced through the mouth and advanced to the second portion of the duodenum , limited by Without limitations. The instrument was slowly withdrawn as the mucosa was fully examined.    "tongue" of salmon colored epithelium coming up 2 cm above a noncritical Schatzki's ring.  No esophagitis.  No tumor.  No nodularity.  Stomach empty.  Small hiatal hernia.  Normal gastric mucosa.  Patent pylorus.  Examination first and second portion of the duodenum revealed no abnormalities.  I was able to see the ampulla fairly well and it also appeared normal utilizing the gastroscope.  Retroflexed views revealed a hiatal hernia. biopsies the abnormal distal esophagus taken for histologic study.The scope was then withdrawn from the patient and the procedure completed.  COMPLICATIONS: There were no immediate complications.  ENDOSCOPIC IMPRESSION: Abnormal distal esophagus?"query short segment  Barrett's?"status post biopsy. Noncritical Schatzki's ring. Hiatal hernia.  RECOMMENDATIONS: hepatic profile today. Follow up on pathology. Continue Prilosec 40 mg daily for the time being. MRCP to further evaluate dilated bile duct.  REPEAT EXAM:  eSigned:  R. Garfield Cornea, MD Rosalita Chessman [facility] Nov 05, 2014 9:51 AM    CC:  CPT CODES: ICD CODES:  The ICD and CPT codes recommended by this software are interpretations from the data that the clinical staff has captured with the software.  The verification of the translation of this report to the ICD and CPT codes and modifiers is the sole responsibility of the health care institution and practicing physician where this report was generated.  [employer] will not be held responsible for the validity of the ICD and CPT codes included on this report.  AMA assumes no liability for data contained or not contained herein. CPT is a Designer, television/film set of the [employer].

## 2014-10-11 NOTE — H&P (View-Only) (Signed)
Primary Care Physician:  Purvis Kilts, MD Primary Gastroenterologist:  Dr. Gala Romney   Chief Complaint  Patient presents with  . Abdominal Pain    right side under breast    HPI:   Kaitlyn Sanchez is a 80 y.o. female presenting today at the request of Dr. Hilma Favors secondary to abdominal pain.   Feels "icky". Has had several episodes of pain in upper abdomen, feels like something is twisting. What she eats doesn't seem to suit her. No vomiting. Pain in RUQ now new. Symptoms for several years, feels like someone is "squeezing you to death in tummy". Exacerbated by food occasionally. RUQ pain radiates to middle. Gallbladder remains in situ. No weight loss. Aspirin 325 mg each morning. Prilosec 40 mg daily for at least 3-4 months. Hasn't noticed much improvement.   Takes probiotic and stool softener, which keeps her on a good bowel regimen. Last colonoscopy several years ago at [facility] per patient, but I do not see this in epic. Per her reports, this was normal. No concerning lower GI symptoms. An [place] of abdomen  Nov 2015: negative gallstones, biliary system mildly dilated up to 8.2. Outside labs have been requested.   Past Medical History  Diagnosis Date  . Hypertension   . Hypothyroidism   . GERD (gastroesophageal reflux disease)   . Hypercholesterolemia     Past Surgical History  Procedure Laterality Date  . Lumbar laminectomy      X 2  . Appendectomy    . Tonsillectomy    . Cataract extraction w/phaco Left 12/28/2013    Procedure: CATARACT EXTRACTION PHACO AND INTRAOCULAR LENS PLACEMENT (IOC);  Surgeon: Tonny Branch, MD;  Location: [facility];  Service: Ophthalmology;  Laterality: Left;  CDE 18.64  . Cataract extraction w/phaco Right 01/07/2014    Procedure: CATARACT EXTRACTION PHACO AND INTRAOCULAR LENS PLACEMENT (IOC);  Surgeon: Tonny Branch, MD;  Location: [facility];  Service: Ophthalmology;  Laterality: Right;  CDE:11.01  . Colonoscopy    . Carpal tunnel release      Current  Outpatient Prescriptions  Medication Sig Dispense Refill  . acidophilus (RISAQUAD) CAPS capsule Take 1 capsule by mouth daily.    Marland Kitchen amLODipine (NORVASC) 10 MG tablet Take 5 mg by mouth 2 (two) times daily.    Marland Kitchen aspirin EC 325 MG tablet Take 325 mg by mouth daily.    Marland Kitchen docusate sodium (COLACE) 100 MG capsule Take 100 mg by mouth 3 (three) times daily.    . Flaxseed, Linseed, (FLAXSEED OIL) 1000 MG CAPS Take 1,000 mg by mouth 2 (two) times daily.    Marland Kitchen glucosamine-chondroitin 500-400 MG tablet Take 1 tablet by mouth daily.    . hydrochlorothiazide (HYDRODIURIL) 25 MG tablet Take 25 mg by mouth daily.    Marland Kitchen levothyroxine (SYNTHROID, LEVOTHROID) 112 MCG tablet Take 100 mcg by mouth daily before breakfast.     . LORazepam (ATIVAN) 1 MG tablet Take 0.5 mg by mouth at bedtime as needed for anxiety.    . multivitamin-iron-minerals-folic acid (CENTRUM) chewable tablet Chew 1 tablet by mouth daily.    Marland Kitchen olmesartan (BENICAR) 20 MG tablet Take 20 mg by mouth daily.    . Omega 3 1200 MG CAPS Take 1,200 mg by mouth 2 (two) times daily.    Marland Kitchen omeprazole (PRILOSEC) 20 MG capsule Take 40 mg by mouth daily.     . potassium gluconate 595 MG TABS tablet Take 595 mg by mouth daily.    Marland Kitchen Soy  Isoflavones 40 MG TABS Take 40 mg by mouth daily.    . vitamin B-12 (CYANOCOBALAMIN) 500 MCG tablet Take 500 mcg by mouth daily.     No current facility-administered medications for this visit.    Allergies as of 10/07/2014  . (No Known Allergies)    Family History  Problem Relation Age of Onset  . Colon cancer      no first degree relatives  . Renal cancer Father     History   Social History  . Marital Status: Widowed    Spouse Name: N/A    Number of Children: N/A  . 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 Smoking status: Never Smoker   . Smokeless tobacco: Not on file  . Alcohol Use: No  . Drug Use: No  . Sexual  Activity: Not on file   Other Topics Concern  . Not on file   Social History Narrative    Review of Systems: Negative as mentioned in HPI  Physical Exam: BP 134/73 mmHg  Pulse 59  Temp(Src) 97.4 F (36.3 C) (Oral)  Ht 5\' 2"  (1.575 m)  Wt 133 lb 12.8 oz (60.691 kg)  BMI 24.47 kg/m2 General:   Alert and oriented. Pleasant and cooperative. Well-nourished and well-developed.  Head:  Normocephalic and atraumatic. Eyes:  Without icterus, sclera clear and conjunctiva pink.  Ears:  Normal auditory acuity. Nose:  No deformity, discharge,  or lesions. Mouth:  No deformity or lesions, oral mucosa pink.  Lungs:  Clear to auscultation bilaterally. No wheezes, rales, or rhonchi. No distress.  Heart:  S1, S2 present without murmurs appreciated.  Abdomen:  +BS, soft, non-tender and non-distended. No HSM noted. No guarding or rebound. No masses appreciated.  Rectal:  Deferred  Msk:  Symmetrical without gross deformities. Normal posture. Extremities:  Without  edema. Neurologic:  Alert and  oriented x4;  grossly normal neurologically. Skin:  Intact without significant lesions or rashes. Psych:  Alert and cooperative. Normal mood and affect.   US abdomen Nov 2015:  Negative for gallstones, biliary system is mildly dilated, measuring up to 8.2 mm.

## 2014-10-12 ENCOUNTER — Encounter (HOSPITAL_COMMUNITY): Payer: Self-pay | Admitting: Internal Medicine

## 2014-10-12 ENCOUNTER — Other Ambulatory Visit: Payer: Self-pay

## 2014-10-12 DIAGNOSIS — K838 Other specified diseases of biliary tract: Secondary | ICD-10-CM

## 2014-10-12 NOTE — Progress Notes (Signed)
cc'ed to pcp °

## 2014-10-12 NOTE — Telephone Encounter (Signed)
Pt is set up for MRCP on 10/19/14 @ 1000. Pt is aware. She does not need a PA for this test

## 2014-10-16 ENCOUNTER — Encounter: Payer: Self-pay | Admitting: Internal Medicine

## 2014-10-19 ENCOUNTER — Other Ambulatory Visit: Payer: Self-pay | Admitting: Internal Medicine

## 2014-10-19 ENCOUNTER — Ambulatory Visit (HOSPITAL_COMMUNITY)
Admission: RE | Admit: 2014-10-19 | Discharge: 2014-10-19 | Disposition: A | Discharge status: Home / Self Care | Payer: Medicare Other | Source: Ambulatory Visit | Attending: Internal Medicine | Admitting: Internal Medicine

## 2014-10-19 DIAGNOSIS — R1011 Right upper quadrant pain: Secondary | ICD-10-CM | POA: Insufficient documentation

## 2014-10-19 DIAGNOSIS — K838 Other specified diseases of biliary tract: Secondary | ICD-10-CM

## 2014-10-19 DIAGNOSIS — K7689 Other specified diseases of liver: Secondary | ICD-10-CM | POA: Diagnosis not present

## 2014-10-19 LAB — POCT I-STAT CREATININE: CREATININE: 0.7 mg/dL (ref 0.50–1.10)

## 2014-10-19 MED ORDER — GADOBENATE DIMEGLUMINE 529 MG/ML IV SOLN
10.0000 mL | Freq: Once | INTRAVENOUS | Status: AC | PRN
  Administered 2014-10-19: 10 mL via INTRAVENOUS

## 2014-10-27 DIAGNOSIS — M47812 Spondylosis without myelopathy or radiculopathy, cervical region: Secondary | ICD-10-CM | POA: Diagnosis not present

## 2014-10-27 DIAGNOSIS — M9901 Segmental and somatic dysfunction of cervical region: Secondary | ICD-10-CM | POA: Diagnosis not present

## 2014-10-28 NOTE — Progress Notes (Signed)
APPOINTMENT CANCELLED 

## 2014-11-09 ENCOUNTER — Ambulatory Visit: Payer: Medicare Other | Admitting: Gastroenterology

## 2015-01-03 ENCOUNTER — Other Ambulatory Visit (HOSPITAL_COMMUNITY): Payer: Self-pay | Admitting: Family Medicine

## 2015-01-03 DIAGNOSIS — Z1231 Encounter for screening mammogram for malignant neoplasm of breast: Secondary | ICD-10-CM

## 2015-02-08 DIAGNOSIS — H524 Presbyopia: Secondary | ICD-10-CM | POA: Diagnosis not present

## 2015-02-08 DIAGNOSIS — H5211 Myopia, right eye: Secondary | ICD-10-CM | POA: Diagnosis not present

## 2015-02-08 DIAGNOSIS — H52221 Regular astigmatism, right eye: Secondary | ICD-10-CM | POA: Diagnosis not present

## 2015-02-08 DIAGNOSIS — H43813 Vitreous degeneration, bilateral: Secondary | ICD-10-CM | POA: Diagnosis not present

## 2015-02-14 ENCOUNTER — Ambulatory Visit (HOSPITAL_COMMUNITY)
Admission: RE | Admit: 2015-02-14 | Discharge: 2015-02-14 | Disposition: A | Discharge status: Home / Self Care | Payer: Medicare Other | Source: Ambulatory Visit | Attending: Family Medicine | Admitting: Family Medicine

## 2015-02-14 DIAGNOSIS — Z1231 Encounter for screening mammogram for malignant neoplasm of breast: Secondary | ICD-10-CM

## 2015-03-16 DIAGNOSIS — M1991 Primary osteoarthritis, unspecified site: Secondary | ICD-10-CM | POA: Diagnosis not present

## 2015-03-16 DIAGNOSIS — M810 Age-related osteoporosis without current pathological fracture: Secondary | ICD-10-CM | POA: Diagnosis not present

## 2015-03-16 DIAGNOSIS — I10 Essential (primary) hypertension: Secondary | ICD-10-CM | POA: Diagnosis not present

## 2015-03-16 DIAGNOSIS — Z0000 Encounter for general adult medical examination without abnormal findings: Secondary | ICD-10-CM | POA: Diagnosis not present

## 2015-03-16 DIAGNOSIS — E782 Mixed hyperlipidemia: Secondary | ICD-10-CM | POA: Diagnosis not present

## 2015-03-16 DIAGNOSIS — E039 Hypothyroidism, unspecified: Secondary | ICD-10-CM | POA: Diagnosis not present

## 2015-03-16 DIAGNOSIS — Z1389 Encounter for screening for other disorder: Secondary | ICD-10-CM | POA: Diagnosis not present

## 2015-03-23 DIAGNOSIS — M9901 Segmental and somatic dysfunction of cervical region: Secondary | ICD-10-CM | POA: Diagnosis not present

## 2015-03-23 DIAGNOSIS — M47812 Spondylosis without myelopathy or radiculopathy, cervical region: Secondary | ICD-10-CM | POA: Diagnosis not present

## 2015-07-12 DIAGNOSIS — Z23 Encounter for immunization: Secondary | ICD-10-CM | POA: Diagnosis not present

## 2015-08-18 ENCOUNTER — Telehealth: Payer: Self-pay | Admitting: Internal Medicine

## 2015-08-18 NOTE — Telephone Encounter (Signed)
Spoke with the pt- she is having watery seepage and hard stools that started about 2.5 weeks ago. She said what little bit of stool she is having, it is very dark colored. She did take about 6 pepto bismal last week but hasnt had any this week. Her last "normal bm" was 2 weeks ago. Only having small bm's now, but she is having to wear a pad d/t the seepage she is having. Feels tired all the time, pt stated she just doesn't seem to feel right. She is not c/o dizziness, no fever, no N/V. She does have a very small amount of lower abd pain every once in awhile but nothing that bothers her too bad. Her last ov was 09/22/15 with AS. Routing to RMR because AS has already left for the day. Has upcoming appt with EG on 08/31/15 but pt is scared to wait that long.   I have put pt on the schedule tomorrow in an Urgent spot with EG. She knows if she gets worse overnight she should go to the ED.

## 2015-08-18 NOTE — Telephone Encounter (Signed)
Pt has OV for 11/23 at 0930 with EG, but would like to speak with nurse about any recommendations. She has been having very black dark stools and has had some seepage. She is worried that she is loosing blood somewhere because she isn't feeling herself. I told her that I would call if anything became available before the 11/23 appointment. Please call [phone] or [phone]

## 2015-08-18 NOTE — Telephone Encounter (Signed)
Symptoms for nearly 3 weeks. Office visit tomorrow should suffice unless she becomes acutely ill.

## 2015-08-18 NOTE — Telephone Encounter (Signed)
Tried to call pt- NA- LMOM 

## 2015-08-19 ENCOUNTER — Ambulatory Visit (INDEPENDENT_AMBULATORY_CARE_PROVIDER_SITE_OTHER): Payer: Medicare Other | Admitting: Nurse Practitioner

## 2015-08-19 ENCOUNTER — Encounter: Payer: Self-pay | Admitting: Nurse Practitioner

## 2015-08-19 ENCOUNTER — Ambulatory Visit (HOSPITAL_COMMUNITY)
Admission: RE | Admit: 2015-08-19 | Discharge: 2015-08-19 | Disposition: A | Discharge status: Home / Self Care | Payer: Medicare Other | Source: Ambulatory Visit | Attending: Nurse Practitioner | Admitting: Nurse Practitioner

## 2015-08-19 ENCOUNTER — Other Ambulatory Visit: Payer: Self-pay | Admitting: Nurse Practitioner

## 2015-08-19 VITALS — BP 135/64 | HR 63 | Temp 97.0°F | Ht 62.0 in | Wt 132.2 lb

## 2015-08-19 DIAGNOSIS — R109 Unspecified abdominal pain: Secondary | ICD-10-CM | POA: Diagnosis not present

## 2015-08-19 DIAGNOSIS — K5900 Constipation, unspecified: Secondary | ICD-10-CM

## 2015-08-19 DIAGNOSIS — R1030 Lower abdominal pain, unspecified: Secondary | ICD-10-CM

## 2015-08-19 DIAGNOSIS — R110 Nausea: Secondary | ICD-10-CM | POA: Diagnosis not present

## 2015-08-19 DIAGNOSIS — R197 Diarrhea, unspecified: Secondary | ICD-10-CM | POA: Insufficient documentation

## 2015-08-19 LAB — COMPREHENSIVE METABOLIC PANEL
ALK PHOS: 73 U/L (ref 33–130)
ALT: 14 U/L (ref 6–29)
AST: 20 U/L (ref 10–35)
Albumin: 3.8 g/dL (ref 3.6–5.1)
BUN: 16 mg/dL (ref 7–25)
CALCIUM: 9.1 mg/dL (ref 8.6–10.4)
CO2: 27 mmol/L (ref 20–31)
Chloride: 95 mmol/L — ABNORMAL LOW (ref 98–110)
Creat: 0.62 mg/dL (ref 0.60–0.93)
Glucose, Bld: 89 mg/dL (ref 65–99)
Potassium: 4 mmol/L (ref 3.5–5.3)
SODIUM: 135 mmol/L (ref 135–146)
Total Bilirubin: 0.4 mg/dL (ref 0.2–1.2)
Total Protein: 6.1 g/dL (ref 6.1–8.1)

## 2015-08-19 LAB — CBC WITH DIFFERENTIAL/PLATELET
BASOS ABS: 0 10*3/uL (ref 0.0–0.1)
BASOS PCT: 0 % (ref 0–1)
EOS PCT: 1 % (ref 0–5)
Eosinophils Absolute: 0.1 10*3/uL (ref 0.0–0.7)
HEMATOCRIT: 35.9 % — AB (ref 36.0–46.0)
Hemoglobin: 12.2 g/dL (ref 12.0–15.0)
Lymphocytes Relative: 29 % (ref 12–46)
Lymphs Abs: 1.8 10*3/uL (ref 0.7–4.0)
MCH: 32.8 pg (ref 26.0–34.0)
MCHC: 34 g/dL (ref 30.0–36.0)
MCV: 96.5 fL (ref 78.0–100.0)
MPV: 9.8 fL (ref 8.6–12.4)
Monocytes Absolute: 0.4 10*3/uL (ref 0.1–1.0)
Monocytes Relative: 7 % (ref 3–12)
NEUTROS ABS: 3.8 10*3/uL (ref 1.7–7.7)
Neutrophils Relative %: 63 % (ref 43–77)
Platelets: 270 10*3/uL (ref 150–400)
RBC: 3.72 MIL/uL — AB (ref 3.87–5.11)
RDW: 12.8 % (ref 11.5–15.5)
WBC: 6.1 10*3/uL (ref 4.0–10.5)

## 2015-08-19 NOTE — Patient Instructions (Signed)
1. Have your labs drawn when you're able to. 2. After x-ray done when you're able to. 3. Return for follow-up in 2 weeks. 4. Start taking Colace over-the-counter daily. Also start taking MiraLAX 17 g (1 capful) once daily, 3 times a week. He can increase this up to one time a day in order to help pass her bowel movements. 5. If you have any severe symptoms such as lightheadedness, dizziness, passing out, large amount of bleeding, severe abdominal pain, uncontrollable nausea and vomiting and proceed to the emergency room for further evaluation. 6. We will see her doing in 2 weeks and determine if we need to do a colonoscopy at that point.

## 2015-08-19 NOTE — Assessment & Plan Note (Signed)
Mild lower abdominal pain described as nonsignificant per the patient. Occurs occasionally. This is likely due to her suspected constipation and hard stools. Treatment and further workup as noted above. Return for follow-up in 2 weeks.

## 2015-08-19 NOTE — Progress Notes (Signed)
Referring Provider: [name], MD Primary Care Physician:  Purvis Kilts, MD Primary GI:  Dr. Gala Romney  Chief Complaint  Patient presents with  . Melena  . Galactorrhea  . change in bowel habits  . Bloated    HPI:   80 year old female presents for dark stools and not feeling well. Per telephone note dated yesterday patient complaining of hard stools and watery seepage started to have weeks ago. The stools she is having are dark tiny and hard. Did take Pepto-Bismol last week but hasn't had any this week. Last normal bowel movement was 2 weeks ago. Feels tired, denied dizziness, fever, nausea vomiting, minor lower abdominal pain. Last colonoscopy 10/11/2014 for epigastric and right upper quadrant abdominal pain which found abnormal distal esophagus query short segment Barrett's status post biopsy, noncritical Schatzki's ring, hiatal hernia. MRCP ordered to further evaluate dilated bile duct. Pathology of esophageal abnormal biopsy showed consistent with GERD. No metaplasia, dysplasia, or malignancy. MRCP completed 10/19/2014 found common duct upper normal without choledocholithiasis, or obstructive mass. Possible constipation.  Today she states she has had change in bowel habits including small hard stools with seepage. Needing pads and extra undergarmants. She has stopped taking Prilosec due to leg cramps, tried restarting yesterday along with GasX and TUMS and is now bloated and feeling backed up. Last dark bowel movement was yesterday morning and "sooty looking." Some mild lower abdominal pain but nothing significant. Denies N/V. Feels "draggy" or fatigued. Admits occasional GERD symptoms. Denies chest pain, dyspnea, dizziness, lightheadedness, syncope, near syncope. Denies any other upper or lower GI symptoms. States her last colonoscopy was "many years ago."    Past Medical History  Diagnosis Date  . Hypertension   . Hypothyroidism   . GERD (gastroesophageal reflux disease)   .  Hypercholesterolemia     Past Surgical History  Procedure Laterality Date  . Lumbar laminectomy      X 2  . Appendectomy    . Tonsillectomy    . Cataract extraction w/phaco Left 12/28/2013    Procedure: CATARACT EXTRACTION PHACO AND INTRAOCULAR LENS PLACEMENT (IOC);  Surgeon: Tonny Branch, MD;  Location: [facility];  Service: Ophthalmology;  Laterality: Left;  CDE 18.64  . Cataract extraction w/phaco Right 01/07/2014    Procedure: CATARACT EXTRACTION PHACO AND INTRAOCULAR LENS PLACEMENT (IOC);  Surgeon: Tonny Branch, MD;  Location: [facility];  Service: Ophthalmology;  Laterality: Right;  CDE:11.01  . Colonoscopy    . Carpal tunnel release    . Esophagogastroduodenoscopy N/A 10/11/2014    Procedure: ESOPHAGOGASTRODUODENOSCOPY (EGD);  Surgeon: Daneil Dolin, MD;  Location: [facility];  Service: Endoscopy;  Laterality: N/A;  930am    Current Outpatient Prescriptions  Medication Sig Dispense Refill  . acidophilus (RISAQUAD) CAPS capsule Take 1 capsule by mouth daily.    Marland Kitchen amLODipine (NORVASC) 10 MG tablet Take 5 mg by mouth 2 (two) times daily.    Marland Kitchen aspirin EC 325 MG tablet Take 325 mg by mouth daily.    Marland Kitchen docusate sodium (COLACE) 100 MG capsule Take 100 mg by mouth 3 (three) times daily.    . Flaxseed, Linseed, (FLAXSEED OIL) 1000 MG CAPS Take 1,000 mg by mouth 2 (two) times daily.    Marland Kitchen glucosamine-chondroitin 500-400 MG tablet Take 1 tablet by mouth daily.    . hydrochlorothiazide (HYDRODIURIL) 25 MG tablet Take 25 mg by mouth daily.    Marland Kitchen levothyroxine (SYNTHROID, LEVOTHROID) 112 MCG tablet Take 100 mcg by mouth daily before breakfast.     .  LORazepam (ATIVAN) 1 MG tablet Take 0.5 mg by mouth at bedtime as needed for anxiety.    . multivitamin-iron-minerals-folic acid (CENTRUM) chewable tablet Chew 1 tablet by mouth daily.    Marland Kitchen olmesartan (BENICAR) 20 MG tablet Take 20 mg by mouth daily.    . Omega 3 1200 MG CAPS Take 1,200 mg by mouth 2 (two) times daily.    Marland Kitchen omeprazole (PRILOSEC) 20 MG capsule  Take 40 mg by mouth daily.     . potassium gluconate 595 MG TABS tablet Take 595 mg by mouth daily.    . Soy Isoflavones 40 MG TABS Take 40 mg by mouth daily.    . vitamin B-12 (CYANOCOBALAMIN) 500 MCG tablet Take 500 mcg by mouth daily.     No current facility-administered medications for this visit.    Allergies as of 08/19/2015  . (No Known Allergies)    Family History  Problem Relation Age of Onset  . Colon cancer      no first degree relatives  . Renal cancer Father     Social History   Social History  . Marital Status: Widowed    Spouse Name: N/A  . Number of Children: N/A  . 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 Smoking status: Never Smoker   . Smokeless tobacco: None  . Alcohol Use: No  . Drug Use: No  . Sexual Activity: Not Asked   Other Topics Concern  . None   Social History Narrative    Review of Systems: General: Negative for anorexia, weight loss, fever, chills. Eyes: Negative for vision changes.  ENT: Negative for hoarseness, difficulty swallowing. CV: Negative for chest pain, angina, palpitations, dyspnea on exertion, peripheral edema.  Respiratory: Negative for dyspnea at rest, cough, sputum, wheezing.  GI: See history of present illness. Endo: Negative for unusual weight change.  Heme: Negative for bruising or bleeding.   Physical Exam: BP 135/64 mmHg  Pulse 63  Temp(Src) 97 F (36.1 C)  Ht 5\' 2"  (1.575 m)  Wt 132 lb 3.2 oz (59.966 kg)  BMI 24.17 kg/m2 General:   Alert and oriented. Pleasant and cooperative. Well-nourished and well-developed.  Head:  Normocephalic and atraumatic. Eyes:  Without icterus, sclera clear and conjunctiva pink.  Throat/Neck:  Supple, without mass or thyromegaly. Cardiovascular:  S1, S2 present without murmurs appreciated. Normal pulses noted. Extremities without clubbing or edema. Respiratory:  Clear to auscultation  bilaterally. No wheezes, rales, or rhonchi. No distress.  Gastrointestinal:  +BS, soft, and non-distended. mild tenderness to lower abdomen. No HSM noted. No guarding or rebound. No masses appreciated.  Rectal:  No masses, fistulas, hemorrhoid, or fecal impaction noted. Heme card negative for blood.  Neurologic:  Alert and oriented x4;  grossly normal neurologically. Psych:  Alert and cooperative. Normal mood and affect. Heme/Lymph/Immune: No excessive bruising noted.    08/19/2015 11:32 AM

## 2015-08-19 NOTE — Assessment & Plan Note (Signed)
Patient with a history of constipation on and off throughout her life. She began having acute onset constipation 2-1/2 weeks ago including a decrease in number of bowel movements. The bowel movements she does have our small, hard, and dark. She does have some seepage at night as well and is eating to where additional pads and undergarments. She is not currently on any stool softeners or promotility agents. She did take Pepto throughout the course of the last week which could've minor dark stools. Heme stool card was negative for blood. Today we'll check CBC, CMP, and abdominal x-ray for any gross abnormalities as well as possible fecal retention. We'll start her on a stool softener including daily Colace, and MiraLAX once daily. We'll have her return in 2 weeks to see if her symptoms improve. ER precautions given. If no improvement in her symptoms can consider other options including possible colonoscopy for evaluation and change in bowel habits.

## 2015-08-22 ENCOUNTER — Encounter: Payer: Self-pay | Admitting: Internal Medicine

## 2015-08-22 NOTE — Progress Notes (Signed)
CC'ED TO PCP 

## 2015-08-31 ENCOUNTER — Ambulatory Visit: Payer: Medicare Other | Admitting: Nurse Practitioner

## 2015-09-06 ENCOUNTER — Ambulatory Visit (INDEPENDENT_AMBULATORY_CARE_PROVIDER_SITE_OTHER): Payer: Medicare Other | Admitting: Nurse Practitioner

## 2015-09-06 ENCOUNTER — Encounter: Payer: Self-pay | Admitting: Nurse Practitioner

## 2015-09-06 VITALS — BP 134/67 | HR 62 | Temp 98.0°F | Ht 62.0 in | Wt 134.4 lb

## 2015-09-06 DIAGNOSIS — K5900 Constipation, unspecified: Secondary | ICD-10-CM | POA: Diagnosis not present

## 2015-09-06 DIAGNOSIS — R1030 Lower abdominal pain, unspecified: Secondary | ICD-10-CM

## 2015-09-06 NOTE — Progress Notes (Signed)
cc'ed to pcp °

## 2015-09-06 NOTE — Assessment & Plan Note (Signed)
Acute exacerbation of constipation resolved. Recommend continue Colace stool softener daily. MiraLAX when necessary for worsening constipation. Return for follow-up as needed for any worsening symptoms

## 2015-09-06 NOTE — Patient Instructions (Signed)
1. Continue taking daily stool softener. 2. She have another bout of constipation you can try her regular regimen and/or MiraLAX as needed. 3. Return for follow-up as needed for severe constipation bouts or any other GI symptoms.

## 2015-09-06 NOTE — Assessment & Plan Note (Signed)
Donovan resolved with resolution of constipation. Return for follow-up as needed.

## 2015-09-06 NOTE — Progress Notes (Signed)
Referring Provider: [name], MD Primary Care Physician:  Purvis Kilts, MD Primary GI: Dr. Gala Romney  Chief Complaint  Patient presents with  . Abdominal Pain    HPI:   80 year old female presents for follow-up on lower abdominal pain and constipation. Last seen in our office 08/19/2015. At that time she was having a change in bowel habits including constipation with small, hard stools and rectal leaking which required pads and external 100 undergarments. Has a chronic history of constipation and this exacerbation started to half weeks prior to her being seen. CBC, CMP, and abdominal x-ray were ordered and she was started on Colace, MiraLAX. Labs essentially normal. Abdominal x-ray showed moderate fecal material within the colon, no other focal abnormality.  Today she states her symptoms are much improved. She has had bouts of constipation throughout her life but her recent constipation bout was "something like I've never had before." 1-2 days after being seen she had a large bowel movement and over the next 2-3 days things returned to normal. She takes daily colace now. Denies further abdominal pain, N/V, hematochezia, melena. Denies any other upper or lower GI symptoms.  Past Medical History  Diagnosis Date  . Hypertension   . Hypothyroidism   . GERD (gastroesophageal reflux disease)   . Hypercholesterolemia     Past Surgical History  Procedure Laterality Date  . Lumbar laminectomy      X 2  . Appendectomy    . Tonsillectomy    . Cataract extraction w/phaco Left 12/28/2013    Procedure: CATARACT EXTRACTION PHACO AND INTRAOCULAR LENS PLACEMENT (IOC);  Surgeon: Tonny Branch, MD;  Location: [facility];  Service: Ophthalmology;  Laterality: Left;  CDE 18.64  . Cataract extraction w/phaco Right 01/07/2014    Procedure: CATARACT EXTRACTION PHACO AND INTRAOCULAR LENS PLACEMENT (IOC);  Surgeon: Tonny Branch, MD;  Location: [facility];  Service: Ophthalmology;  Laterality: Right;  CDE:11.01    . Colonoscopy    . Carpal tunnel release    . Esophagogastroduodenoscopy N/A 10/11/2014    RMR:non critical schatzkis ring/HH    Current Outpatient Prescriptions  Medication Sig Dispense Refill  . acidophilus (RISAQUAD) CAPS capsule Take 1 capsule by mouth daily.    Marland Kitchen amLODipine (NORVASC) 10 MG tablet Take 5 mg by mouth 2 (two) times daily.    Marland Kitchen aspirin EC 325 MG tablet Take 325 mg by mouth daily.    Marland Kitchen docusate sodium (COLACE) 100 MG capsule Take 100 mg by mouth 3 (three) times daily.    . Flaxseed, Linseed, (FLAXSEED OIL) 1000 MG CAPS Take 1,000 mg by mouth 2 (two) times daily.    Marland Kitchen glucosamine-chondroitin 500-400 MG tablet Take 1 tablet by mouth daily.    . hydrochlorothiazide (HYDRODIURIL) 25 MG tablet Take 25 mg by mouth daily.    Marland Kitchen levothyroxine (SYNTHROID, LEVOTHROID) 112 MCG tablet Take 100 mcg by mouth daily before breakfast.     . LORazepam (ATIVAN) 1 MG tablet Take 0.5 mg by mouth at bedtime as needed for anxiety.    . multivitamin-iron-minerals-folic acid (CENTRUM) chewable tablet Chew 1 tablet by mouth daily.    Marland Kitchen olmesartan (BENICAR) 20 MG tablet Take 20 mg by mouth daily.    . Omega 3 1200 MG CAPS Take 1,200 mg by mouth 2 (two) times daily.    Marland Kitchen omeprazole (PRILOSEC) 20 MG capsule Take 40 mg by mouth daily.     . potassium gluconate 595 MG TABS tablet Take 595 mg by mouth daily.    Marland Kitchen  Soy Isoflavones 40 MG TABS Take 40 mg by mouth daily.    . vitamin B-12 (CYANOCOBALAMIN) 500 MCG tablet Take 500 mcg by mouth daily.     No current facility-administered medications for this visit.    Allergies as of 09/06/2015  . (No Known Allergies)    Family History  Problem Relation Age of Onset  . Colon cancer      no first degree relatives  . Renal cancer Father     Social History   Social History  . Marital Status: Widowed    Spouse Name: N/A  . Number of Children: N/A  . Years of Education: N/A   Occupational History  . retired     Cabin crew in [place] History Main Topics  . Smoking status: Never Smoker   . Smokeless tobacco: None  . Alcohol Use: No  . Drug Use: No  . Sexual Activity: Not Asked   Other Topics Concern  . None   Social History Narrative    Review of Systems: General: Negative for anorexia, weight loss, fever, chills, fatigue, weakness. CV: Negative for chest pain, angina, palpitations, peripheral edema.  Respiratory: Negative for dyspnea at rest, cough, sputum, wheezing.  GI: See history of present illness. Endo: Negative for unusual weight change.    Physical Exam: BP 134/67 mmHg  Pulse 62  Temp(Src) 98 F (36.7 C) (Oral)  Ht 5\' 2"  (1.575 m)  Wt 134 lb 6.4 oz (60.963 kg)  BMI 24.58 kg/m2 General:   Alert and oriented. Pleasant and cooperative. Well-nourished and well-developed.  Cardiovascular:  S1, S2 present without murmurs appreciated. Extremities without clubbing or edema. Respiratory:  Clear to auscultation bilaterally. No wheezes, rales, or rhonchi. No distress.  Gastrointestinal:  +BS, soft, non-tender and non-distended. No HSM noted. No guarding or rebound. No masses appreciated.  Rectal:  Deferred  Neurologic:  Alert and oriented x4;  grossly normal neurologically. Psych:  Alert and cooperative. Normal mood and affect.    09/06/2015 11:02 AM

## 2015-09-21 DIAGNOSIS — E890 Postprocedural hypothyroidism: Secondary | ICD-10-CM | POA: Diagnosis not present

## 2015-10-05 DIAGNOSIS — M9902 Segmental and somatic dysfunction of thoracic region: Secondary | ICD-10-CM | POA: Diagnosis not present

## 2015-10-05 DIAGNOSIS — M9901 Segmental and somatic dysfunction of cervical region: Secondary | ICD-10-CM | POA: Diagnosis not present

## 2015-10-05 DIAGNOSIS — S335XXA Sprain of ligaments of lumbar spine, initial encounter: Secondary | ICD-10-CM | POA: Diagnosis not present

## 2015-10-05 DIAGNOSIS — M4004 Postural kyphosis, thoracic region: Secondary | ICD-10-CM | POA: Diagnosis not present

## 2015-10-05 DIAGNOSIS — M9903 Segmental and somatic dysfunction of lumbar region: Secondary | ICD-10-CM | POA: Diagnosis not present

## 2015-10-05 DIAGNOSIS — S134XXA Sprain of ligaments of cervical spine, initial encounter: Secondary | ICD-10-CM | POA: Diagnosis not present

## 2015-10-20 DIAGNOSIS — G4700 Insomnia, unspecified: Secondary | ICD-10-CM | POA: Diagnosis not present

## 2015-10-20 DIAGNOSIS — Z1389 Encounter for screening for other disorder: Secondary | ICD-10-CM | POA: Diagnosis not present

## 2015-10-20 DIAGNOSIS — Z6823 Body mass index (BMI) 23.0-23.9, adult: Secondary | ICD-10-CM | POA: Diagnosis not present

## 2015-10-21 ENCOUNTER — Other Ambulatory Visit (HOSPITAL_COMMUNITY): Payer: Self-pay | Admitting: Family Medicine

## 2015-10-21 DIAGNOSIS — M810 Age-related osteoporosis without current pathological fracture: Secondary | ICD-10-CM

## 2015-10-28 ENCOUNTER — Ambulatory Visit (HOSPITAL_COMMUNITY)
Admission: RE | Admit: 2015-10-28 | Discharge: 2015-10-28 | Disposition: A | Discharge status: Home / Self Care | Payer: Medicare Other | Source: Ambulatory Visit | Attending: Family Medicine | Admitting: Family Medicine

## 2015-10-28 DIAGNOSIS — M8589 Other specified disorders of bone density and structure, multiple sites: Secondary | ICD-10-CM | POA: Insufficient documentation

## 2015-10-28 DIAGNOSIS — M8588 Other specified disorders of bone density and structure, other site: Secondary | ICD-10-CM | POA: Diagnosis not present

## 2015-10-28 DIAGNOSIS — Z6823 Body mass index (BMI) 23.0-23.9, adult: Secondary | ICD-10-CM | POA: Diagnosis not present

## 2015-10-28 DIAGNOSIS — Z13820 Encounter for screening for osteoporosis: Secondary | ICD-10-CM | POA: Diagnosis not present

## 2015-10-28 DIAGNOSIS — M810 Age-related osteoporosis without current pathological fracture: Secondary | ICD-10-CM

## 2016-01-23 ENCOUNTER — Other Ambulatory Visit (HOSPITAL_COMMUNITY): Payer: Self-pay | Admitting: Family Medicine

## 2016-01-23 DIAGNOSIS — Z1231 Encounter for screening mammogram for malignant neoplasm of breast: Secondary | ICD-10-CM

## 2016-02-09 DIAGNOSIS — H524 Presbyopia: Secondary | ICD-10-CM | POA: Diagnosis not present

## 2016-02-09 DIAGNOSIS — H43813 Vitreous degeneration, bilateral: Secondary | ICD-10-CM | POA: Diagnosis not present

## 2016-02-09 DIAGNOSIS — H52221 Regular astigmatism, right eye: Secondary | ICD-10-CM | POA: Diagnosis not present

## 2016-02-09 DIAGNOSIS — H5211 Myopia, right eye: Secondary | ICD-10-CM | POA: Diagnosis not present

## 2016-02-15 ENCOUNTER — Ambulatory Visit (HOSPITAL_COMMUNITY)
Admission: RE | Admit: 2016-02-15 | Discharge: 2016-02-15 | Disposition: A | Discharge status: Home / Self Care | Payer: Medicare Other | Source: Ambulatory Visit | Attending: Family Medicine | Admitting: Family Medicine

## 2016-02-15 DIAGNOSIS — Z1231 Encounter for screening mammogram for malignant neoplasm of breast: Secondary | ICD-10-CM | POA: Diagnosis not present

## 2016-02-25 DIAGNOSIS — L03115 Cellulitis of right lower limb: Secondary | ICD-10-CM | POA: Diagnosis not present

## 2016-03-27 DIAGNOSIS — Z0000 Encounter for general adult medical examination without abnormal findings: Secondary | ICD-10-CM | POA: Diagnosis not present

## 2016-03-27 DIAGNOSIS — Z6822 Body mass index (BMI) 22.0-22.9, adult: Secondary | ICD-10-CM | POA: Diagnosis not present

## 2016-03-27 DIAGNOSIS — Z1389 Encounter for screening for other disorder: Secondary | ICD-10-CM | POA: Diagnosis not present

## 2016-03-27 DIAGNOSIS — E039 Hypothyroidism, unspecified: Secondary | ICD-10-CM | POA: Diagnosis not present

## 2016-03-27 DIAGNOSIS — M810 Age-related osteoporosis without current pathological fracture: Secondary | ICD-10-CM | POA: Diagnosis not present

## 2016-03-27 DIAGNOSIS — I10 Essential (primary) hypertension: Secondary | ICD-10-CM | POA: Diagnosis not present

## 2016-03-27 DIAGNOSIS — M1991 Primary osteoarthritis, unspecified site: Secondary | ICD-10-CM | POA: Diagnosis not present

## 2016-03-27 DIAGNOSIS — E782 Mixed hyperlipidemia: Secondary | ICD-10-CM | POA: Diagnosis not present

## 2016-03-29 DIAGNOSIS — H26491 Other secondary cataract, right eye: Secondary | ICD-10-CM | POA: Diagnosis not present

## 2016-03-29 DIAGNOSIS — H26493 Other secondary cataract, bilateral: Secondary | ICD-10-CM | POA: Diagnosis not present

## 2016-04-03 DIAGNOSIS — M4004 Postural kyphosis, thoracic region: Secondary | ICD-10-CM | POA: Diagnosis not present

## 2016-04-03 DIAGNOSIS — M9901 Segmental and somatic dysfunction of cervical region: Secondary | ICD-10-CM | POA: Diagnosis not present

## 2016-04-03 DIAGNOSIS — S134XXA Sprain of ligaments of cervical spine, initial encounter: Secondary | ICD-10-CM | POA: Diagnosis not present

## 2016-04-03 DIAGNOSIS — M9903 Segmental and somatic dysfunction of lumbar region: Secondary | ICD-10-CM | POA: Diagnosis not present

## 2016-04-03 DIAGNOSIS — M9902 Segmental and somatic dysfunction of thoracic region: Secondary | ICD-10-CM | POA: Diagnosis not present

## 2016-04-03 DIAGNOSIS — S335XXA Sprain of ligaments of lumbar spine, initial encounter: Secondary | ICD-10-CM | POA: Diagnosis not present

## 2016-07-03 DIAGNOSIS — J209 Acute bronchitis, unspecified: Secondary | ICD-10-CM | POA: Diagnosis not present

## 2016-07-03 DIAGNOSIS — J019 Acute sinusitis, unspecified: Secondary | ICD-10-CM | POA: Diagnosis not present

## 2016-08-03 DIAGNOSIS — Z23 Encounter for immunization: Secondary | ICD-10-CM | POA: Diagnosis not present

## 2016-09-12 DIAGNOSIS — E782 Mixed hyperlipidemia: Secondary | ICD-10-CM | POA: Diagnosis not present

## 2016-09-12 DIAGNOSIS — I10 Essential (primary) hypertension: Secondary | ICD-10-CM | POA: Diagnosis not present

## 2016-09-12 DIAGNOSIS — Z6824 Body mass index (BMI) 24.0-24.9, adult: Secondary | ICD-10-CM | POA: Diagnosis not present

## 2016-09-12 DIAGNOSIS — E039 Hypothyroidism, unspecified: Secondary | ICD-10-CM | POA: Diagnosis not present

## 2016-10-03 DIAGNOSIS — M9901 Segmental and somatic dysfunction of cervical region: Secondary | ICD-10-CM | POA: Diagnosis not present

## 2016-10-03 DIAGNOSIS — M9903 Segmental and somatic dysfunction of lumbar region: Secondary | ICD-10-CM | POA: Diagnosis not present

## 2016-10-03 DIAGNOSIS — S335XXA Sprain of ligaments of lumbar spine, initial encounter: Secondary | ICD-10-CM | POA: Diagnosis not present

## 2016-10-03 DIAGNOSIS — S134XXA Sprain of ligaments of cervical spine, initial encounter: Secondary | ICD-10-CM | POA: Diagnosis not present

## 2016-10-03 DIAGNOSIS — M9902 Segmental and somatic dysfunction of thoracic region: Secondary | ICD-10-CM | POA: Diagnosis not present

## 2016-10-03 DIAGNOSIS — M4004 Postural kyphosis, thoracic region: Secondary | ICD-10-CM | POA: Diagnosis not present

## 2016-11-13 DIAGNOSIS — R531 Weakness: Secondary | ICD-10-CM | POA: Diagnosis not present

## 2016-11-13 DIAGNOSIS — I252 Old myocardial infarction: Secondary | ICD-10-CM | POA: Diagnosis not present

## 2016-11-13 DIAGNOSIS — R42 Dizziness and giddiness: Secondary | ICD-10-CM | POA: Diagnosis not present

## 2016-11-13 DIAGNOSIS — E876 Hypokalemia: Secondary | ICD-10-CM | POA: Diagnosis not present

## 2016-11-13 DIAGNOSIS — F419 Anxiety disorder, unspecified: Secondary | ICD-10-CM | POA: Diagnosis not present

## 2016-11-13 DIAGNOSIS — R55 Syncope and collapse: Secondary | ICD-10-CM | POA: Diagnosis not present

## 2016-11-13 DIAGNOSIS — I10 Essential (primary) hypertension: Secondary | ICD-10-CM | POA: Diagnosis not present

## 2016-11-13 DIAGNOSIS — R509 Fever, unspecified: Secondary | ICD-10-CM | POA: Diagnosis not present

## 2016-11-13 DIAGNOSIS — E039 Hypothyroidism, unspecified: Secondary | ICD-10-CM | POA: Diagnosis not present

## 2016-11-13 DIAGNOSIS — E878 Other disorders of electrolyte and fluid balance, not elsewhere classified: Secondary | ICD-10-CM | POA: Diagnosis not present

## 2016-11-13 DIAGNOSIS — R404 Transient alteration of awareness: Secondary | ICD-10-CM | POA: Diagnosis not present

## 2016-11-13 DIAGNOSIS — E871 Hypo-osmolality and hyponatremia: Secondary | ICD-10-CM | POA: Diagnosis not present

## 2016-11-14 DIAGNOSIS — R55 Syncope and collapse: Secondary | ICD-10-CM | POA: Diagnosis not present

## 2016-11-14 DIAGNOSIS — R42 Dizziness and giddiness: Secondary | ICD-10-CM | POA: Diagnosis not present

## 2016-11-19 DIAGNOSIS — E876 Hypokalemia: Secondary | ICD-10-CM | POA: Diagnosis not present

## 2016-11-19 DIAGNOSIS — Z1389 Encounter for screening for other disorder: Secondary | ICD-10-CM | POA: Diagnosis not present

## 2016-11-19 DIAGNOSIS — Z6824 Body mass index (BMI) 24.0-24.9, adult: Secondary | ICD-10-CM | POA: Diagnosis not present

## 2016-11-19 DIAGNOSIS — E871 Hypo-osmolality and hyponatremia: Secondary | ICD-10-CM | POA: Diagnosis not present

## 2016-11-27 DIAGNOSIS — I10 Essential (primary) hypertension: Secondary | ICD-10-CM | POA: Diagnosis not present

## 2016-11-27 DIAGNOSIS — E876 Hypokalemia: Secondary | ICD-10-CM | POA: Diagnosis not present

## 2016-11-27 DIAGNOSIS — E039 Hypothyroidism, unspecified: Secondary | ICD-10-CM | POA: Diagnosis not present

## 2016-11-27 DIAGNOSIS — E871 Hypo-osmolality and hyponatremia: Secondary | ICD-10-CM | POA: Diagnosis not present

## 2016-11-28 DIAGNOSIS — Z6823 Body mass index (BMI) 23.0-23.9, adult: Secondary | ICD-10-CM | POA: Diagnosis not present

## 2016-11-28 DIAGNOSIS — Z1389 Encounter for screening for other disorder: Secondary | ICD-10-CM | POA: Diagnosis not present

## 2016-11-28 DIAGNOSIS — R5383 Other fatigue: Secondary | ICD-10-CM | POA: Diagnosis not present

## 2016-11-28 DIAGNOSIS — R05 Cough: Secondary | ICD-10-CM | POA: Diagnosis not present

## 2017-01-03 DIAGNOSIS — H16223 Keratoconjunctivitis sicca, not specified as Sjogren's, bilateral: Secondary | ICD-10-CM | POA: Diagnosis not present

## 2017-01-09 ENCOUNTER — Other Ambulatory Visit (HOSPITAL_COMMUNITY): Payer: Self-pay | Admitting: Family Medicine

## 2017-01-09 DIAGNOSIS — Z1231 Encounter for screening mammogram for malignant neoplasm of breast: Secondary | ICD-10-CM

## 2017-02-04 DIAGNOSIS — H16223 Keratoconjunctivitis sicca, not specified as Sjogren's, bilateral: Secondary | ICD-10-CM | POA: Diagnosis not present

## 2017-02-25 ENCOUNTER — Ambulatory Visit (HOSPITAL_COMMUNITY)
Admission: RE | Admit: 2017-02-25 | Discharge: 2017-02-25 | Disposition: A | Discharge status: Home / Self Care | Payer: Medicare Other | Source: Ambulatory Visit | Attending: Family Medicine | Admitting: Family Medicine

## 2017-02-25 DIAGNOSIS — Z1231 Encounter for screening mammogram for malignant neoplasm of breast: Secondary | ICD-10-CM | POA: Diagnosis not present

## 2017-03-08 DIAGNOSIS — N3000 Acute cystitis without hematuria: Secondary | ICD-10-CM | POA: Diagnosis not present

## 2017-03-08 DIAGNOSIS — M1991 Primary osteoarthritis, unspecified site: Secondary | ICD-10-CM | POA: Diagnosis not present

## 2017-03-08 DIAGNOSIS — R32 Unspecified urinary incontinence: Secondary | ICD-10-CM | POA: Diagnosis not present

## 2017-03-08 DIAGNOSIS — Z6823 Body mass index (BMI) 23.0-23.9, adult: Secondary | ICD-10-CM | POA: Diagnosis not present

## 2017-03-08 DIAGNOSIS — I10 Essential (primary) hypertension: Secondary | ICD-10-CM | POA: Diagnosis not present

## 2017-04-09 DIAGNOSIS — E039 Hypothyroidism, unspecified: Secondary | ICD-10-CM | POA: Diagnosis not present

## 2017-04-09 DIAGNOSIS — Z0000 Encounter for general adult medical examination without abnormal findings: Secondary | ICD-10-CM | POA: Diagnosis not present

## 2017-04-09 DIAGNOSIS — F419 Anxiety disorder, unspecified: Secondary | ICD-10-CM | POA: Diagnosis not present

## 2017-04-09 DIAGNOSIS — Z6823 Body mass index (BMI) 23.0-23.9, adult: Secondary | ICD-10-CM | POA: Diagnosis not present

## 2017-04-09 DIAGNOSIS — Z1389 Encounter for screening for other disorder: Secondary | ICD-10-CM | POA: Diagnosis not present

## 2017-04-09 DIAGNOSIS — E782 Mixed hyperlipidemia: Secondary | ICD-10-CM | POA: Diagnosis not present

## 2017-04-09 DIAGNOSIS — M1991 Primary osteoarthritis, unspecified site: Secondary | ICD-10-CM | POA: Diagnosis not present

## 2017-04-09 DIAGNOSIS — M810 Age-related osteoporosis without current pathological fracture: Secondary | ICD-10-CM | POA: Diagnosis not present

## 2017-04-09 DIAGNOSIS — I10 Essential (primary) hypertension: Secondary | ICD-10-CM | POA: Diagnosis not present

## 2017-05-11 DIAGNOSIS — H8110 Benign paroxysmal vertigo, unspecified ear: Secondary | ICD-10-CM | POA: Diagnosis not present

## 2017-05-17 DIAGNOSIS — S134XXA Sprain of ligaments of cervical spine, initial encounter: Secondary | ICD-10-CM | POA: Diagnosis not present

## 2017-05-17 DIAGNOSIS — M9901 Segmental and somatic dysfunction of cervical region: Secondary | ICD-10-CM | POA: Diagnosis not present

## 2017-05-17 DIAGNOSIS — M9903 Segmental and somatic dysfunction of lumbar region: Secondary | ICD-10-CM | POA: Diagnosis not present

## 2017-05-17 DIAGNOSIS — M9902 Segmental and somatic dysfunction of thoracic region: Secondary | ICD-10-CM | POA: Diagnosis not present

## 2017-05-17 DIAGNOSIS — S335XXA Sprain of ligaments of lumbar spine, initial encounter: Secondary | ICD-10-CM | POA: Diagnosis not present

## 2017-05-17 DIAGNOSIS — M4004 Postural kyphosis, thoracic region: Secondary | ICD-10-CM | POA: Diagnosis not present

## 2017-05-22 DIAGNOSIS — M9903 Segmental and somatic dysfunction of lumbar region: Secondary | ICD-10-CM | POA: Diagnosis not present

## 2017-05-22 DIAGNOSIS — M9902 Segmental and somatic dysfunction of thoracic region: Secondary | ICD-10-CM | POA: Diagnosis not present

## 2017-05-22 DIAGNOSIS — S335XXA Sprain of ligaments of lumbar spine, initial encounter: Secondary | ICD-10-CM | POA: Diagnosis not present

## 2017-05-22 DIAGNOSIS — M9901 Segmental and somatic dysfunction of cervical region: Secondary | ICD-10-CM | POA: Diagnosis not present

## 2017-05-22 DIAGNOSIS — M4004 Postural kyphosis, thoracic region: Secondary | ICD-10-CM | POA: Diagnosis not present

## 2017-05-22 DIAGNOSIS — S134XXA Sprain of ligaments of cervical spine, initial encounter: Secondary | ICD-10-CM | POA: Diagnosis not present

## 2017-05-29 DIAGNOSIS — S335XXA Sprain of ligaments of lumbar spine, initial encounter: Secondary | ICD-10-CM | POA: Diagnosis not present

## 2017-05-29 DIAGNOSIS — S134XXA Sprain of ligaments of cervical spine, initial encounter: Secondary | ICD-10-CM | POA: Diagnosis not present

## 2017-05-29 DIAGNOSIS — M9902 Segmental and somatic dysfunction of thoracic region: Secondary | ICD-10-CM | POA: Diagnosis not present

## 2017-05-29 DIAGNOSIS — M9901 Segmental and somatic dysfunction of cervical region: Secondary | ICD-10-CM | POA: Diagnosis not present

## 2017-05-29 DIAGNOSIS — M4004 Postural kyphosis, thoracic region: Secondary | ICD-10-CM | POA: Diagnosis not present

## 2017-05-29 DIAGNOSIS — M9903 Segmental and somatic dysfunction of lumbar region: Secondary | ICD-10-CM | POA: Diagnosis not present

## 2017-06-12 DIAGNOSIS — S335XXA Sprain of ligaments of lumbar spine, initial encounter: Secondary | ICD-10-CM | POA: Diagnosis not present

## 2017-06-12 DIAGNOSIS — M4004 Postural kyphosis, thoracic region: Secondary | ICD-10-CM | POA: Diagnosis not present

## 2017-06-12 DIAGNOSIS — S134XXA Sprain of ligaments of cervical spine, initial encounter: Secondary | ICD-10-CM | POA: Diagnosis not present

## 2017-06-12 DIAGNOSIS — M9902 Segmental and somatic dysfunction of thoracic region: Secondary | ICD-10-CM | POA: Diagnosis not present

## 2017-06-12 DIAGNOSIS — M9901 Segmental and somatic dysfunction of cervical region: Secondary | ICD-10-CM | POA: Diagnosis not present

## 2017-06-12 DIAGNOSIS — M9903 Segmental and somatic dysfunction of lumbar region: Secondary | ICD-10-CM | POA: Diagnosis not present

## 2017-06-19 DIAGNOSIS — D225 Melanocytic nevi of trunk: Secondary | ICD-10-CM | POA: Diagnosis not present

## 2017-06-19 DIAGNOSIS — C44622 Squamous cell carcinoma of skin of right upper limb, including shoulder: Secondary | ICD-10-CM | POA: Diagnosis not present

## 2017-06-19 DIAGNOSIS — L821 Other seborrheic keratosis: Secondary | ICD-10-CM | POA: Diagnosis not present

## 2017-07-10 DIAGNOSIS — S134XXA Sprain of ligaments of cervical spine, initial encounter: Secondary | ICD-10-CM | POA: Diagnosis not present

## 2017-07-10 DIAGNOSIS — M9901 Segmental and somatic dysfunction of cervical region: Secondary | ICD-10-CM | POA: Diagnosis not present

## 2017-07-10 DIAGNOSIS — M4004 Postural kyphosis, thoracic region: Secondary | ICD-10-CM | POA: Diagnosis not present

## 2017-07-10 DIAGNOSIS — M9902 Segmental and somatic dysfunction of thoracic region: Secondary | ICD-10-CM | POA: Diagnosis not present

## 2017-07-10 DIAGNOSIS — S335XXA Sprain of ligaments of lumbar spine, initial encounter: Secondary | ICD-10-CM | POA: Diagnosis not present

## 2017-07-10 DIAGNOSIS — M9903 Segmental and somatic dysfunction of lumbar region: Secondary | ICD-10-CM | POA: Diagnosis not present

## 2017-08-28 DIAGNOSIS — Z23 Encounter for immunization: Secondary | ICD-10-CM | POA: Diagnosis not present

## 2017-10-03 DIAGNOSIS — E782 Mixed hyperlipidemia: Secondary | ICD-10-CM | POA: Diagnosis not present

## 2017-10-03 DIAGNOSIS — I10 Essential (primary) hypertension: Secondary | ICD-10-CM | POA: Diagnosis not present

## 2017-10-03 DIAGNOSIS — M810 Age-related osteoporosis without current pathological fracture: Secondary | ICD-10-CM | POA: Diagnosis not present

## 2017-10-03 DIAGNOSIS — Z1389 Encounter for screening for other disorder: Secondary | ICD-10-CM | POA: Diagnosis not present

## 2017-10-03 DIAGNOSIS — M1991 Primary osteoarthritis, unspecified site: Secondary | ICD-10-CM | POA: Diagnosis not present

## 2017-10-03 DIAGNOSIS — E039 Hypothyroidism, unspecified: Secondary | ICD-10-CM | POA: Diagnosis not present

## 2017-10-03 DIAGNOSIS — Z6824 Body mass index (BMI) 24.0-24.9, adult: Secondary | ICD-10-CM | POA: Diagnosis not present

## 2017-10-03 DIAGNOSIS — F419 Anxiety disorder, unspecified: Secondary | ICD-10-CM | POA: Diagnosis not present

## 2017-10-22 DIAGNOSIS — Z6824 Body mass index (BMI) 24.0-24.9, adult: Secondary | ICD-10-CM | POA: Diagnosis not present

## 2017-10-22 DIAGNOSIS — E039 Hypothyroidism, unspecified: Secondary | ICD-10-CM | POA: Diagnosis not present

## 2017-12-03 DIAGNOSIS — E039 Hypothyroidism, unspecified: Secondary | ICD-10-CM | POA: Diagnosis not present

## 2018-01-01 DIAGNOSIS — Z9849 Cataract extraction status, unspecified eye: Secondary | ICD-10-CM | POA: Diagnosis not present

## 2018-01-01 DIAGNOSIS — Z961 Presence of intraocular lens: Secondary | ICD-10-CM | POA: Diagnosis not present

## 2018-01-01 DIAGNOSIS — H43813 Vitreous degeneration, bilateral: Secondary | ICD-10-CM | POA: Diagnosis not present

## 2018-01-01 DIAGNOSIS — H35033 Hypertensive retinopathy, bilateral: Secondary | ICD-10-CM | POA: Diagnosis not present

## 2018-02-03 ENCOUNTER — Other Ambulatory Visit (HOSPITAL_COMMUNITY): Payer: Self-pay | Admitting: Family Medicine

## 2018-02-03 DIAGNOSIS — Z1231 Encounter for screening mammogram for malignant neoplasm of breast: Secondary | ICD-10-CM

## 2018-02-07 IMAGING — MG 2D DIGITAL SCREENING BILATERAL MAMMOGRAM WITH CAD AND ADJUNCT TO
8 series · 9 of 24 positions shown · non-contrast
Comparison: Previous exam(s).

CLINICAL DATA: Screening.

EXAM:
2D DIGITAL SCREENING BILATERAL MAMMOGRAM WITH CAD AND ADJUNCT TOMO

[L MLO]
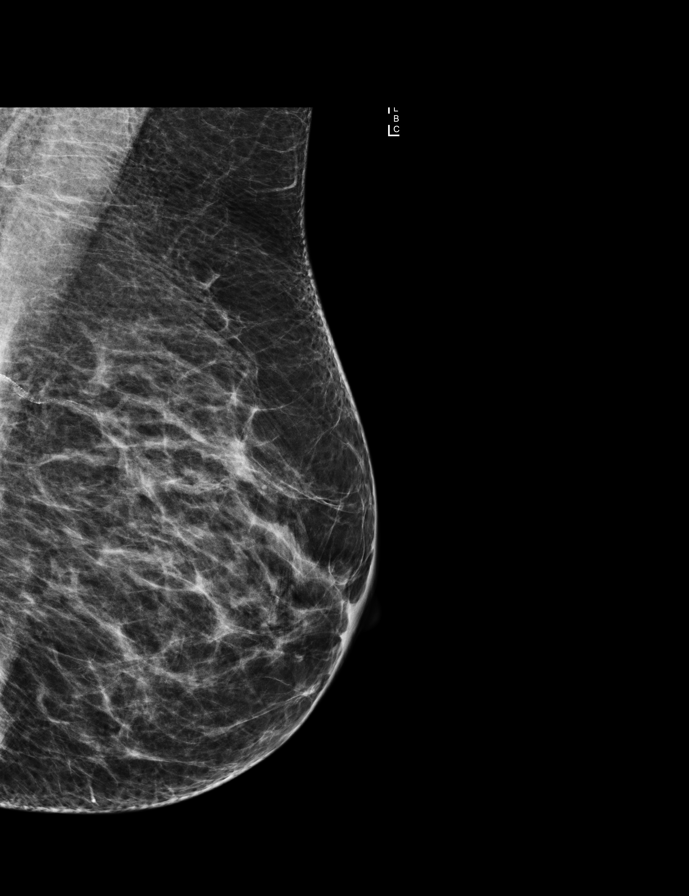

[R MLO]
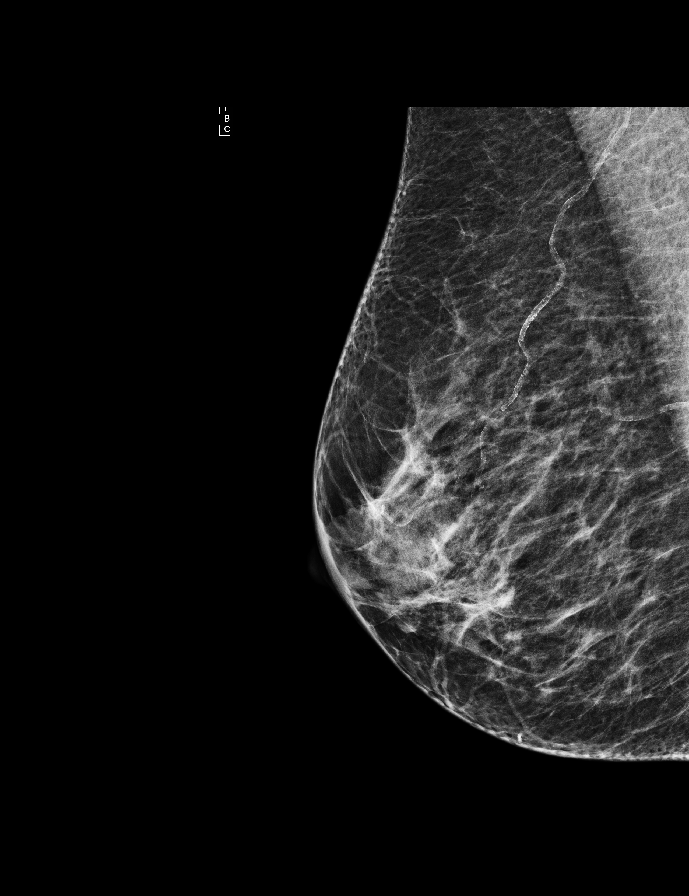

[L CC]
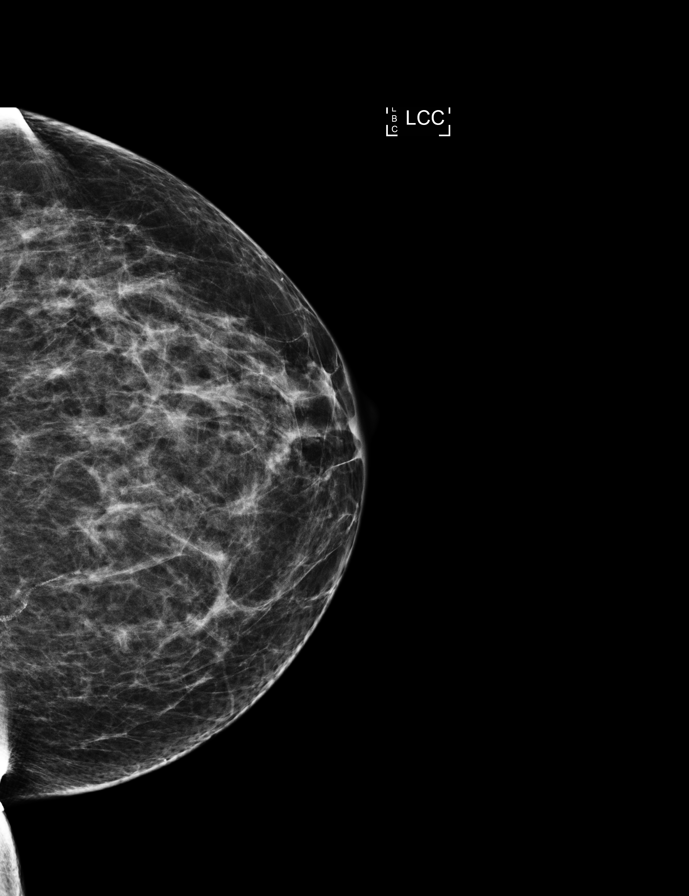

[R CC]
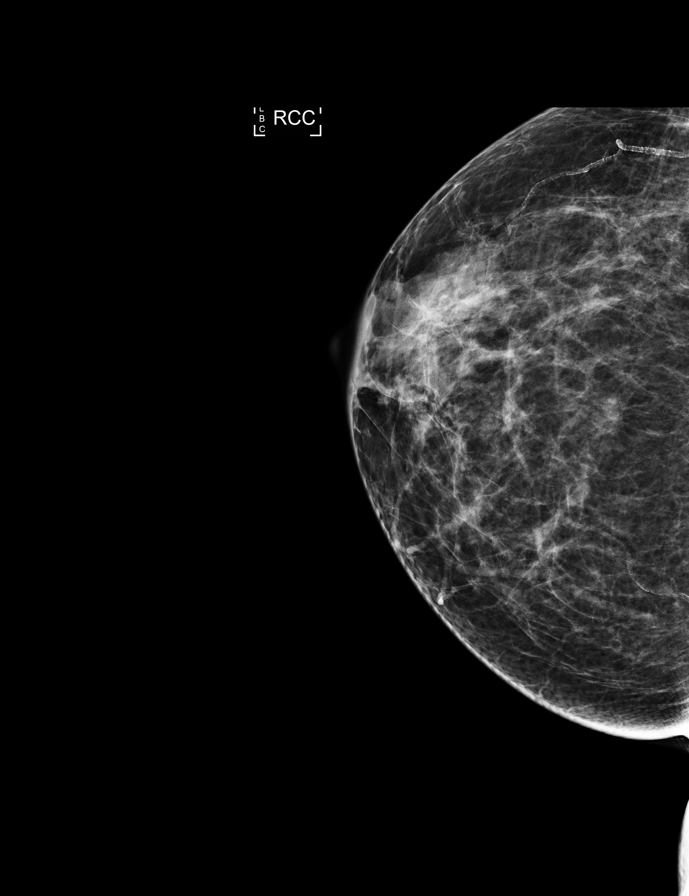

[R CC tomo · 2 of 61 frames shown]
[frame 20/61]
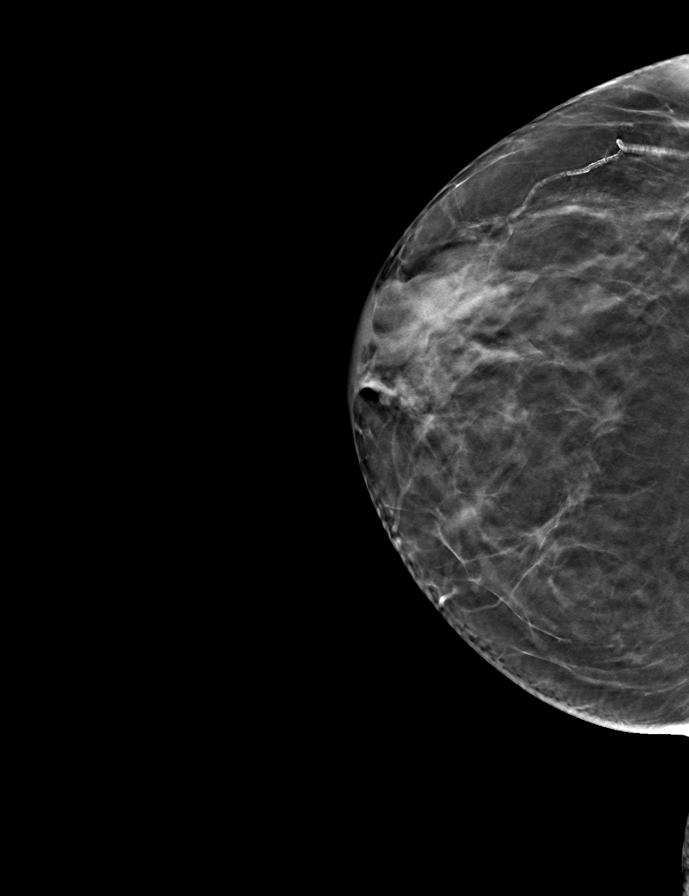
[frame 31/61]
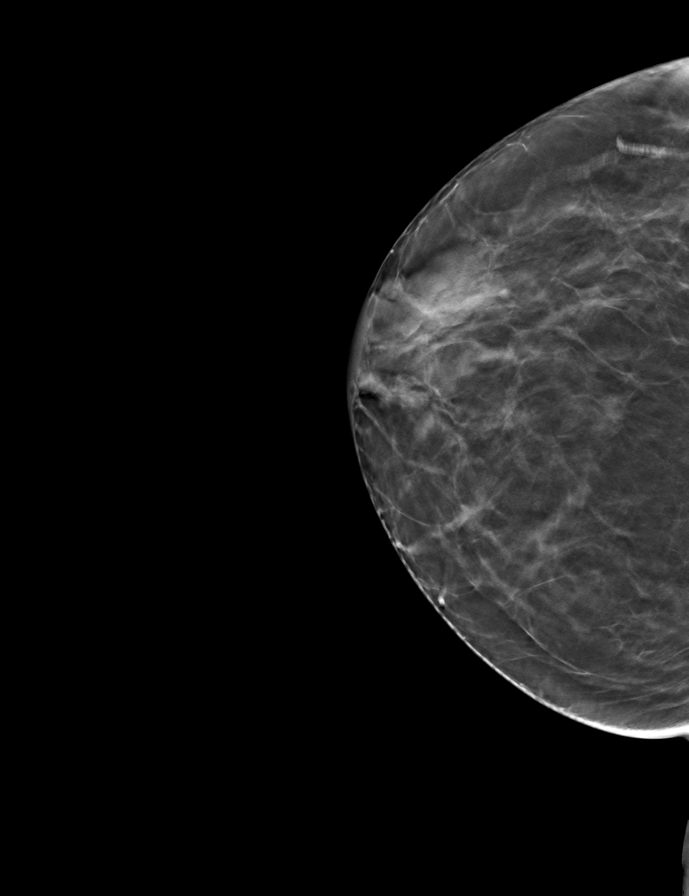

[L CC tomo · tomo slice 33/65.0]
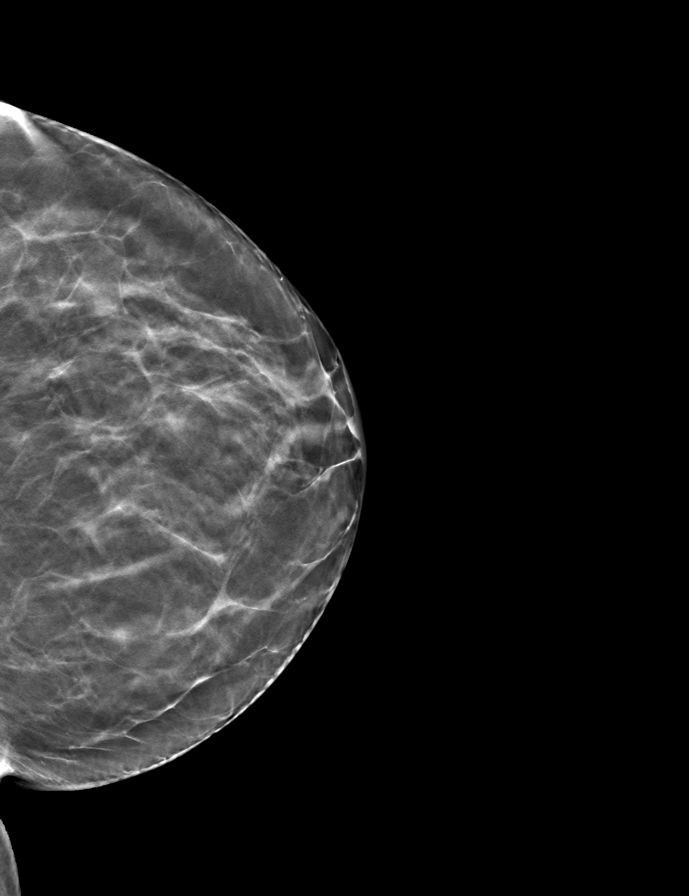

[R MLO tomo · tomo slice 33/64.0]
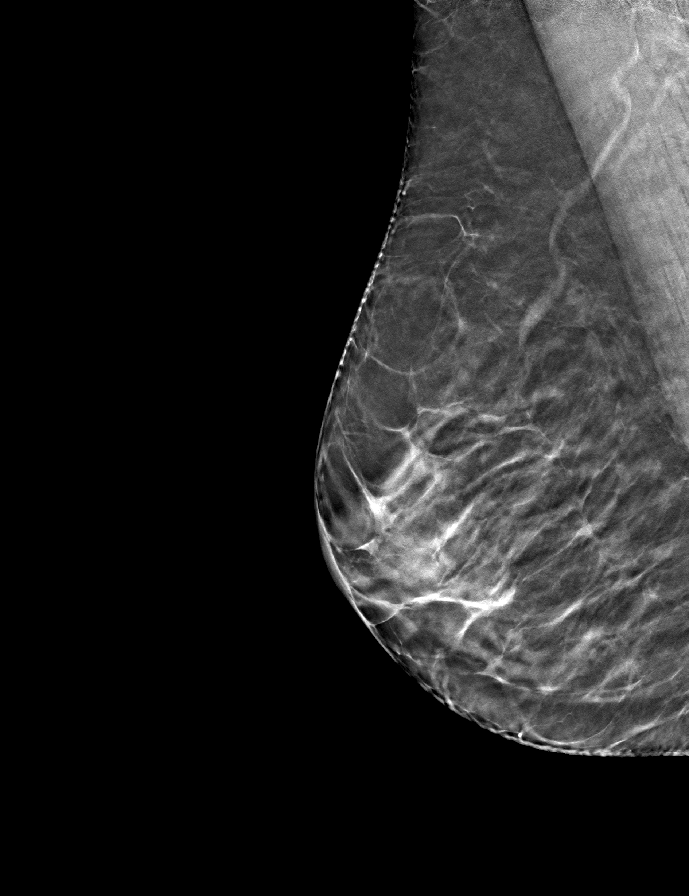

[L MLO tomo · tomo slice 36/71.0]
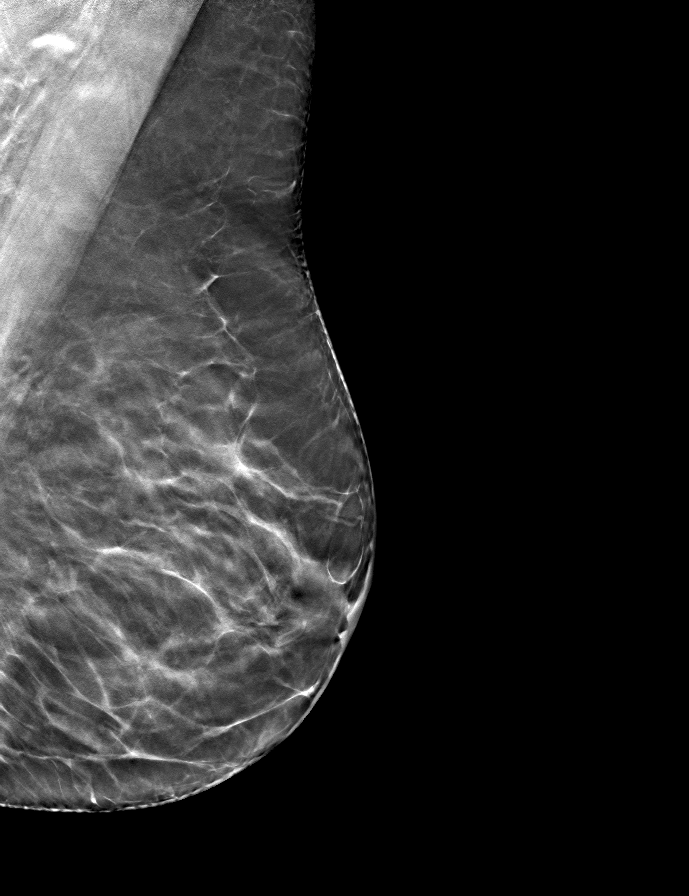

[9 of 24 positions shown; findings below may reference images not displayed]

ACR Breast Density Category b: There are scattered areas of
fibroglandular density.
FINDINGS: There are no findings suspicious for malignancy. Images were
processed with CAD.
IMPRESSION: No mammographic evidence of malignancy. A result letter of this
screening mammogram will be mailed directly to the patient.

RECOMMENDATION:
Screening mammogram in one year. (Code:97-6-RS4)

BI-RADS CATEGORY  1: Negative.

## 2018-02-26 ENCOUNTER — Encounter (HOSPITAL_COMMUNITY): Payer: Self-pay

## 2018-02-26 ENCOUNTER — Ambulatory Visit (HOSPITAL_COMMUNITY)
Admission: RE | Admit: 2018-02-26 | Discharge: 2018-02-26 | Disposition: A | Discharge status: Home / Self Care | Payer: Medicare Other | Source: Ambulatory Visit | Attending: Family Medicine | Admitting: Family Medicine

## 2018-02-26 DIAGNOSIS — Z1231 Encounter for screening mammogram for malignant neoplasm of breast: Secondary | ICD-10-CM | POA: Diagnosis not present

## 2018-03-06 DIAGNOSIS — S134XXA Sprain of ligaments of cervical spine, initial encounter: Secondary | ICD-10-CM | POA: Diagnosis not present

## 2018-03-06 DIAGNOSIS — M9901 Segmental and somatic dysfunction of cervical region: Secondary | ICD-10-CM | POA: Diagnosis not present

## 2018-03-06 DIAGNOSIS — M4004 Postural kyphosis, thoracic region: Secondary | ICD-10-CM | POA: Diagnosis not present

## 2018-03-06 DIAGNOSIS — M9902 Segmental and somatic dysfunction of thoracic region: Secondary | ICD-10-CM | POA: Diagnosis not present

## 2018-03-06 DIAGNOSIS — S335XXA Sprain of ligaments of lumbar spine, initial encounter: Secondary | ICD-10-CM | POA: Diagnosis not present

## 2018-03-06 DIAGNOSIS — M9903 Segmental and somatic dysfunction of lumbar region: Secondary | ICD-10-CM | POA: Diagnosis not present

## 2018-03-20 DIAGNOSIS — H35033 Hypertensive retinopathy, bilateral: Secondary | ICD-10-CM | POA: Diagnosis not present

## 2018-03-20 DIAGNOSIS — I10 Essential (primary) hypertension: Secondary | ICD-10-CM | POA: Diagnosis not present

## 2018-04-23 DIAGNOSIS — Z0001 Encounter for general adult medical examination with abnormal findings: Secondary | ICD-10-CM | POA: Diagnosis not present

## 2018-04-23 DIAGNOSIS — E782 Mixed hyperlipidemia: Secondary | ICD-10-CM | POA: Diagnosis not present

## 2018-04-23 DIAGNOSIS — Z6823 Body mass index (BMI) 23.0-23.9, adult: Secondary | ICD-10-CM | POA: Diagnosis not present

## 2018-04-23 DIAGNOSIS — M810 Age-related osteoporosis without current pathological fracture: Secondary | ICD-10-CM | POA: Diagnosis not present

## 2018-04-23 DIAGNOSIS — E559 Vitamin D deficiency, unspecified: Secondary | ICD-10-CM | POA: Diagnosis not present

## 2018-04-23 DIAGNOSIS — E039 Hypothyroidism, unspecified: Secondary | ICD-10-CM | POA: Diagnosis not present

## 2018-04-23 DIAGNOSIS — F419 Anxiety disorder, unspecified: Secondary | ICD-10-CM | POA: Diagnosis not present

## 2018-04-23 DIAGNOSIS — I10 Essential (primary) hypertension: Secondary | ICD-10-CM | POA: Diagnosis not present

## 2018-04-23 DIAGNOSIS — Z1389 Encounter for screening for other disorder: Secondary | ICD-10-CM | POA: Diagnosis not present

## 2018-04-28 ENCOUNTER — Other Ambulatory Visit (HOSPITAL_COMMUNITY): Payer: Self-pay | Admitting: Family Medicine

## 2018-04-28 DIAGNOSIS — E2839 Other primary ovarian failure: Secondary | ICD-10-CM

## 2018-05-12 ENCOUNTER — Ambulatory Visit (HOSPITAL_COMMUNITY)
Admission: RE | Admit: 2018-05-12 | Discharge: 2018-05-12 | Disposition: A | Discharge status: Home / Self Care | Payer: Medicare Other | Source: Ambulatory Visit | Attending: Family Medicine | Admitting: Family Medicine

## 2018-05-12 DIAGNOSIS — Z780 Asymptomatic menopausal state: Secondary | ICD-10-CM | POA: Diagnosis not present

## 2018-05-12 DIAGNOSIS — E2839 Other primary ovarian failure: Secondary | ICD-10-CM | POA: Insufficient documentation

## 2018-05-12 DIAGNOSIS — M8589 Other specified disorders of bone density and structure, multiple sites: Secondary | ICD-10-CM | POA: Diagnosis not present

## 2018-05-12 DIAGNOSIS — M85852 Other specified disorders of bone density and structure, left thigh: Secondary | ICD-10-CM | POA: Insufficient documentation

## 2018-05-12 DIAGNOSIS — M85831 Other specified disorders of bone density and structure, right forearm: Secondary | ICD-10-CM | POA: Diagnosis not present

## 2018-05-29 DIAGNOSIS — M9903 Segmental and somatic dysfunction of lumbar region: Secondary | ICD-10-CM | POA: Diagnosis not present

## 2018-05-29 DIAGNOSIS — M9901 Segmental and somatic dysfunction of cervical region: Secondary | ICD-10-CM | POA: Diagnosis not present

## 2018-05-29 DIAGNOSIS — M9902 Segmental and somatic dysfunction of thoracic region: Secondary | ICD-10-CM | POA: Diagnosis not present

## 2018-05-29 DIAGNOSIS — S134XXA Sprain of ligaments of cervical spine, initial encounter: Secondary | ICD-10-CM | POA: Diagnosis not present

## 2018-05-29 DIAGNOSIS — S335XXA Sprain of ligaments of lumbar spine, initial encounter: Secondary | ICD-10-CM | POA: Diagnosis not present

## 2018-05-29 DIAGNOSIS — M4004 Postural kyphosis, thoracic region: Secondary | ICD-10-CM | POA: Diagnosis not present

## 2018-07-21 DIAGNOSIS — Z6823 Body mass index (BMI) 23.0-23.9, adult: Secondary | ICD-10-CM | POA: Diagnosis not present

## 2018-07-21 DIAGNOSIS — J029 Acute pharyngitis, unspecified: Secondary | ICD-10-CM | POA: Diagnosis not present

## 2018-07-21 DIAGNOSIS — H6123 Impacted cerumen, bilateral: Secondary | ICD-10-CM | POA: Diagnosis not present

## 2018-07-21 DIAGNOSIS — Z1389 Encounter for screening for other disorder: Secondary | ICD-10-CM | POA: Diagnosis not present

## 2018-08-04 DIAGNOSIS — Z23 Encounter for immunization: Secondary | ICD-10-CM | POA: Diagnosis not present

## 2018-08-21 DIAGNOSIS — S335XXA Sprain of ligaments of lumbar spine, initial encounter: Secondary | ICD-10-CM | POA: Diagnosis not present

## 2018-08-21 DIAGNOSIS — M9903 Segmental and somatic dysfunction of lumbar region: Secondary | ICD-10-CM | POA: Diagnosis not present

## 2018-08-21 DIAGNOSIS — M4004 Postural kyphosis, thoracic region: Secondary | ICD-10-CM | POA: Diagnosis not present

## 2018-08-21 DIAGNOSIS — S134XXA Sprain of ligaments of cervical spine, initial encounter: Secondary | ICD-10-CM | POA: Diagnosis not present

## 2018-08-21 DIAGNOSIS — M9902 Segmental and somatic dysfunction of thoracic region: Secondary | ICD-10-CM | POA: Diagnosis not present

## 2018-08-21 DIAGNOSIS — M9901 Segmental and somatic dysfunction of cervical region: Secondary | ICD-10-CM | POA: Diagnosis not present

## 2018-11-19 DIAGNOSIS — M4004 Postural kyphosis, thoracic region: Secondary | ICD-10-CM | POA: Diagnosis not present

## 2018-11-19 DIAGNOSIS — S335XXA Sprain of ligaments of lumbar spine, initial encounter: Secondary | ICD-10-CM | POA: Diagnosis not present

## 2018-11-19 DIAGNOSIS — M9902 Segmental and somatic dysfunction of thoracic region: Secondary | ICD-10-CM | POA: Diagnosis not present

## 2018-11-19 DIAGNOSIS — M9901 Segmental and somatic dysfunction of cervical region: Secondary | ICD-10-CM | POA: Diagnosis not present

## 2018-11-19 DIAGNOSIS — S134XXA Sprain of ligaments of cervical spine, initial encounter: Secondary | ICD-10-CM | POA: Diagnosis not present

## 2018-11-19 DIAGNOSIS — M9903 Segmental and somatic dysfunction of lumbar region: Secondary | ICD-10-CM | POA: Diagnosis not present

## 2018-12-17 DIAGNOSIS — H35033 Hypertensive retinopathy, bilateral: Secondary | ICD-10-CM | POA: Diagnosis not present

## 2018-12-17 DIAGNOSIS — H43813 Vitreous degeneration, bilateral: Secondary | ICD-10-CM | POA: Diagnosis not present

## 2018-12-17 DIAGNOSIS — Z961 Presence of intraocular lens: Secondary | ICD-10-CM | POA: Diagnosis not present

## 2018-12-17 DIAGNOSIS — I10 Essential (primary) hypertension: Secondary | ICD-10-CM | POA: Diagnosis not present

## 2018-12-18 ENCOUNTER — Other Ambulatory Visit (HOSPITAL_COMMUNITY): Payer: Self-pay | Admitting: Family Medicine

## 2018-12-18 DIAGNOSIS — E039 Hypothyroidism, unspecified: Secondary | ICD-10-CM | POA: Diagnosis not present

## 2018-12-18 DIAGNOSIS — M810 Age-related osteoporosis without current pathological fracture: Secondary | ICD-10-CM

## 2018-12-18 DIAGNOSIS — M1991 Primary osteoarthritis, unspecified site: Secondary | ICD-10-CM | POA: Diagnosis not present

## 2018-12-18 DIAGNOSIS — I10 Essential (primary) hypertension: Secondary | ICD-10-CM | POA: Diagnosis not present

## 2018-12-18 DIAGNOSIS — F419 Anxiety disorder, unspecified: Secondary | ICD-10-CM | POA: Diagnosis not present

## 2018-12-18 DIAGNOSIS — Z6824 Body mass index (BMI) 24.0-24.9, adult: Secondary | ICD-10-CM | POA: Diagnosis not present

## 2018-12-19 ENCOUNTER — Other Ambulatory Visit (HOSPITAL_COMMUNITY): Payer: Self-pay | Admitting: Family Medicine

## 2018-12-19 DIAGNOSIS — M1991 Primary osteoarthritis, unspecified site: Secondary | ICD-10-CM

## 2018-12-19 DIAGNOSIS — F419 Anxiety disorder, unspecified: Secondary | ICD-10-CM

## 2018-12-19 DIAGNOSIS — M810 Age-related osteoporosis without current pathological fracture: Secondary | ICD-10-CM

## 2018-12-19 DIAGNOSIS — I10 Essential (primary) hypertension: Secondary | ICD-10-CM

## 2018-12-24 ENCOUNTER — Other Ambulatory Visit: Payer: Self-pay

## 2018-12-24 ENCOUNTER — Ambulatory Visit (HOSPITAL_COMMUNITY)
Admission: RE | Admit: 2018-12-24 | Discharge: 2018-12-24 | Disposition: A | Discharge status: Home / Self Care | Payer: Medicare Other | Source: Ambulatory Visit | Attending: Family Medicine | Admitting: Family Medicine

## 2018-12-24 DIAGNOSIS — H3560 Retinal hemorrhage, unspecified eye: Secondary | ICD-10-CM | POA: Diagnosis not present

## 2018-12-24 DIAGNOSIS — I10 Essential (primary) hypertension: Secondary | ICD-10-CM

## 2018-12-24 DIAGNOSIS — F419 Anxiety disorder, unspecified: Secondary | ICD-10-CM | POA: Diagnosis not present

## 2018-12-24 DIAGNOSIS — M810 Age-related osteoporosis without current pathological fracture: Secondary | ICD-10-CM | POA: Diagnosis not present

## 2018-12-24 DIAGNOSIS — M1991 Primary osteoarthritis, unspecified site: Secondary | ICD-10-CM | POA: Insufficient documentation

## 2018-12-28 NOTE — Progress Notes (Signed)
[facility] Note  12/29/2018     CHIEF COMPLAINT Patient presents for Retina Evaluation   HISTORY OF PRESENT ILLNESS: Kaitlyn Sanchez is a 84 y.o. female who presents to the clinic today for:   HPI    Retina Evaluation    In both eyes.  This started 2 months ago.  Duration of 2 months.  Associated Symptoms Floaters.  Negative for Flashes, Blind Spot, Photophobia, Scalp Tenderness, Fever, Pain, Glare, Jaw Claudication, Weight Loss, Distortion, Redness, Trauma, Shoulder/Hip pain and Fatigue.  Context:  distance vision and near vision.  Treatments tried include no treatments.  I, the attending physician,  performed the HPI with the patient and updated documentation appropriately.          Comments    Patient states referred by Dr. Jorja Loa for bleeding in retina OU. Patient is not diabetic. On HTN meds for 20 years. BP is well controlled with meds.        Last edited by Bernarda Caffey, MD on 12/29/2018  2:18 PM. (History)    pt states she was referred here by Dr. Jorja Loa because he saw some bleeding in the back of her eyes, pt denies being diabetic, but states she is not sure she has ever been checked for it, pt states she assumes her PCP checks her blood sugar levels when he does routine blood work, pt states she is hypertensive, but it is controlled with medication, but states her vision has been good every since she had cataract sx, she states she's also had a yag procedure done in both eyes as well, pt states she takes two 81 mg aspirin a day  Referring physician: Madelin Headings, DO [address] [place], [place] [id number]  HISTORICAL INFORMATION:   Selected notes from the MEDICAL RECORD NUMBER Referred by Dr. Madelin Headings for concern of diabetic retinopathy LEE: 03.11.20 (M. Cotter) [BCVA: OD: 20/20- OS: 20/20-] Ocular Hx-pseudo OU (Dr. Tonny Branch, 2015), HTN ret, vitreous degeneration PMH-HTN, HLD, hypothyroidism    CURRENT MEDICATIONS: No current  outpatient medications on file. (Ophthalmic Drugs)   No current facility-administered medications for this visit.  (Ophthalmic Drugs)   Current Outpatient Medications (Other)  Medication Sig  . acidophilus (RISAQUAD) CAPS capsule Take 1 capsule by mouth daily.  Marland Kitchen aspirin EC 325 MG tablet Take 81 mg by mouth daily.   . Flaxseed, Linseed, (FLAXSEED OIL) 1000 MG CAPS Take 1,000 mg by mouth 2 (two) times daily.  Marland Kitchen glucosamine-chondroitin 500-400 MG tablet Take 1 tablet by mouth daily.  . hydrochlorothiazide (HYDRODIURIL) 25 MG tablet Take 25 mg by mouth daily.  Marland Kitchen levothyroxine (SYNTHROID, LEVOTHROID) 112 MCG tablet Take 100 mcg by mouth daily before breakfast.   . LORazepam (ATIVAN) 1 MG tablet Take 0.5 mg by mouth at bedtime as needed for anxiety.  . multivitamin-iron-minerals-folic acid (CENTRUM) chewable tablet Chew 1 tablet by mouth daily.  Marland Kitchen olmesartan (BENICAR) 20 MG tablet Take 20 mg by mouth daily.  . Omega 3 1200 MG CAPS Take 1,200 mg by mouth 2 (two) times daily.  Marland Kitchen omeprazole (PRILOSEC) 20 MG capsule Take 40 mg by mouth daily.   . potassium gluconate 595 MG TABS tablet Take 595 mg by mouth daily.  . Soy Isoflavones 40 MG TABS Take 40 mg by mouth daily.  . vitamin B-12 (CYANOCOBALAMIN) 500 MCG tablet Take 500 mcg by mouth daily.  . vitamin C (ASCORBIC ACID) 500 MG tablet Take 500 mg by mouth daily.  Marland Kitchen amLODipine (  NORVASC) 10 MG tablet Take 5 mg by mouth 2 (two) times daily.  Marland Kitchen docusate sodium (COLACE) 100 MG capsule Take 100 mg by mouth 3 (three) times daily.   No current facility-administered medications for this visit.  (Other)      REVIEW OF SYSTEMS: ROS    Positive for: Endocrine, Eyes   Negative for: Constitutional, Gastrointestinal, Neurological, Skin, Genitourinary, Musculoskeletal, HENT, Cardiovascular, Respiratory, Psychiatric, Allergic/Imm, Heme/Lymph   Last edited by Roselee Nova D on 12/29/2018  1:43 PM. (History)       ALLERGIES No Known Allergies  PAST  MEDICAL HISTORY Past Medical History:  Diagnosis Date  . GERD (gastroesophageal reflux disease)   . Hypercholesterolemia   . Hypertension   . Hypothyroidism    Past Surgical History:  Procedure Laterality Date  . APPENDECTOMY    . CARPAL TUNNEL RELEASE    . CATARACT EXTRACTION W/PHACO Left 12/28/2013   Procedure: CATARACT EXTRACTION PHACO AND INTRAOCULAR LENS PLACEMENT (IOC);  Surgeon: Tonny Branch, MD;  Location: [facility];  Service: Ophthalmology;  Laterality: Left;  CDE 18.64  . CATARACT EXTRACTION W/PHACO Right 01/07/2014   Procedure: CATARACT EXTRACTION PHACO AND INTRAOCULAR LENS PLACEMENT (IOC);  Surgeon: Tonny Branch, MD;  Location: [facility];  Service: Ophthalmology;  Laterality: Right;  CDE:11.01  . COLONOSCOPY    . ESOPHAGOGASTRODUODENOSCOPY N/A 10/11/2014   RMR:non critical schatzkis ring/HH  . LUMBAR LAMINECTOMY     X 2  . TONSILLECTOMY      FAMILY HISTORY Family History  Problem Relation Age of Onset  . Colon cancer Other        no first degree relatives  . Renal cancer Father   . Glaucoma Mother     SOCIAL HISTORY Social History   Tobacco Use  . Smoking status: Never Smoker  . Smokeless tobacco: Never Used  Substance Use Topics  . Alcohol use: No  . Drug use: No         OPHTHALMIC EXAM:  Base Eye Exam    Visual Acuity (Snellen - Linear)      Right Left   Dist cc 20/25 +2 20/25 +2   Dist ph cc NI NI   Correction:  Glasses       Tonometry (Tonopen, 1:58 PM)      Right Left   Pressure 13 14       Pupils      Dark Light Shape React APD   Right 3 2 Round Brisk None   Left 3 2 Round Brisk None       Visual Fields (Counting fingers)      Left Right    Full Full       Extraocular Movement      Right Left    Full, Ortho Full, Ortho       Neuro/Psych    Oriented x3:  Yes   Mood/Affect:  Normal       Dilation    Both eyes:  1.0% Mydriacyl, 2.5% Phenylephrine @ 1:58 PM        Slit Lamp and Fundus Exam    Slit Lamp Exam      Right Left    Lids/Lashes Dermatochalasis - upper lid Dermatochalasis - upper lid   Conjunctiva/Sclera mild temporal Pinguecula mild nasal temporal Pinguecula   Cornea Arcus, mild Debris in tear film, 1+ Punctate epithelial erosions, Well healed cataract wounds Arcus, mild Debris in tear film, 1+ Punctate epithelial erosions, Well healed cataract wounds   Anterior Chamber Deep and quiet Deep  and quiet   Iris Round and dilated Round and moderately dilated to 56m   Lens PC IOL in good position with open PC PC IOL in good position with open PC   Vitreous Vitreous syneresis, Posterior vitreous detachment mild Vitreous syneresis, Posterior vitreous detachment       Fundus Exam      Right Left   Disc Pink and Sharp, +SVP Pink and Sharp   C/D Ratio 0.3 0.35   Macula Blunted foveal reflex, scattered paracentral IRH/DBH Flat, Blunted foveal reflex, rare Microaneurysms, no edema   Vessels Vascular attenuation, AV crossing changes Vascular attenuation, AV crossing changes   Periphery Attached, no heme Attached, no heme        Refraction    Wearing Rx      Sphere Cylinder Add   Right -0.25 Sphere +2.25   Left -0.25 Sphere +2.25   Type:  PAL       Manifest Refraction      Sphere Cylinder Axis Dist VA   Right -0.25 Sphere  20/20-2   Left -0.50 +0.75 080 20/20-2          IMAGING AND PROCEDURES  Imaging and Procedures for '@TODAY'$ @  OCT, Retina - OU - Both Eyes       Right Eye Quality was good. Central Foveal Thickness: 271. Progression has no prior data. Findings include normal foveal contour, no SRF, intraretinal fluid (Mild cystic changes temporal macula).   Left Eye Quality was good. Progression has no prior data. Findings include normal foveal contour, no IRF, no SRF.   Notes *Images captured and stored on drive  Diagnosis / Impression:  OD: NFP, no SRF, mild cystic changes temporal macula OS: NFP, no IRF/SRF   Clinical management:  See below  Abbreviations: NFP - Normal foveal  profile. CME - cystoid macular edema. PED - pigment epithelial detachment. IRF - intraretinal fluid. SRF - subretinal fluid. EZ - ellipsoid zone. ERM - epiretinal membrane. ORA - outer retinal atrophy. ORT - outer retinal tubulation. SRHM - subretinal hyper-reflective material        Fluorescein Angiography Optos (Transit OD)       Right Eye   Progression has no prior data. Early phase findings include microaneurysm (Focal MA). Mid/Late phase findings include microaneurysm, leakage.   Left Eye   Progression has no prior data. Early phase findings include microaneurysm (Focal MA). Mid/Late phase findings include leakage, microaneurysm.   Notes Images stored on drive;   Impression: Focal MA with leakage OU (OD>OS) HTN ret v diabetes                  ASSESSMENT/PLAN:    ICD-10-CM   1. Hypertensive retinopathy of both eyes H35.033 Fluorescein Angiography Optos (Transit OD)  2. Essential hypertension I10   3. Retinal edema H35.81 OCT, Retina - OU - Both Eyes  4. Pseudophakia of both eyes Z96.1     1-3. Hypertensive retinopathy with retinal edema (OD > OS)  - focal intraretinal hemes in macula OU (OD > OS)  - OCT shows mild macular edema OD  - FA shows focal MA in macula with late leakage OU -- no   - BP in office measured 208/80 -- pt asymptomatic (no headache, numbness, tingling)  - discussed importance of tight BP control  - recommend further evaluation/managment by PCP -- will send letter to Dr. GHilma Favors - monitor for now  - F/u 6 weeks for repeat DFE/OCT  4. Pseudophakia OU  - s/p CE/IOL  OU - Dr. Tonny Branch (2015)  - beautiful surgeries, doing well  - monitor    Ophthalmic Meds Ordered this visit:  No orders of the defined types were placed in this encounter.      Return in about 6 weeks (around 02/09/2019) for f/u HTN ret OU, DFE, OCT.  There are no Patient Instructions on file for this visit.   Explained the diagnoses, plan, and follow up with  the patient and they expressed understanding.  Patient expressed understanding of the importance of proper follow up care.   Gardiner Sleeper, M.D., Ph.D. Diseases & Surgery of the Retina and Vitreous [facility]  I have reviewed the above documentation for accuracy and completeness, and I agree with the above. Gardiner Sleeper, M.D., Ph.D. 12/30/18 12:53 PM      Abbreviations: M myopia (nearsighted); A astigmatism; H hyperopia (farsighted); P presbyopia; Mrx spectacle prescription;  CTL contact lenses; OD right eye; OS left eye; OU both eyes  XT exotropia; ET esotropia; PEK punctate epithelial keratitis; PEE punctate epithelial erosions; DES dry eye syndrome; MGD meibomian gland dysfunction; ATs artificial tears; PFAT's preservative free artificial tears; JAARS nuclear sclerotic cataract; PSC posterior subcapsular cataract; ERM epi-retinal membrane; PVD posterior vitreous detachment; RD retinal detachment; DM diabetes mellitus; DR diabetic retinopathy; NPDR non-proliferative diabetic retinopathy; PDR proliferative diabetic retinopathy; CSME clinically significant macular edema; DME diabetic macular edema; dbh dot blot hemorrhages; CWS cotton wool spot; POAG primary open angle glaucoma; C/D cup-to-disc ratio; HVF humphrey visual field; GVF goldmann visual field; OCT optical coherence tomography; IOP intraocular pressure; BRVO Branch retinal vein occlusion; CRVO central retinal vein occlusion; CRAO central retinal artery occlusion; BRAO branch retinal artery occlusion; RT retinal tear; SB scleral buckle; PPV pars plana vitrectomy; VH Vitreous hemorrhage; PRP panretinal laser photocoagulation; IVK intravitreal kenalog; VMT vitreomacular traction; MH Macular hole;  NVD neovascularization of the disc; NVE neovascularization elsewhere; AREDS age related eye disease study; ARMD age related macular degeneration; POAG primary open angle glaucoma; EBMD epithelial/anterior basement membrane  dystrophy; ACIOL anterior chamber intraocular lens; IOL intraocular lens; PCIOL posterior chamber intraocular lens; Phaco/IOL phacoemulsification with intraocular lens placement; [place] photorefractive keratectomy; LASIK laser assisted in situ keratomileusis; HTN hypertension; DM diabetes mellitus; COPD chronic obstructive pulmonary disease

## 2018-12-29 ENCOUNTER — Other Ambulatory Visit: Payer: Self-pay

## 2018-12-29 ENCOUNTER — Ambulatory Visit (INDEPENDENT_AMBULATORY_CARE_PROVIDER_SITE_OTHER): Payer: Medicare Other | Admitting: Ophthalmology

## 2018-12-29 ENCOUNTER — Encounter (INDEPENDENT_AMBULATORY_CARE_PROVIDER_SITE_OTHER): Payer: Self-pay | Admitting: Ophthalmology

## 2018-12-29 VITALS — BP 208/80 | HR 68

## 2018-12-29 DIAGNOSIS — H3581 Retinal edema: Secondary | ICD-10-CM | POA: Diagnosis not present

## 2018-12-29 DIAGNOSIS — I10 Essential (primary) hypertension: Secondary | ICD-10-CM

## 2018-12-29 DIAGNOSIS — H35033 Hypertensive retinopathy, bilateral: Secondary | ICD-10-CM

## 2018-12-29 DIAGNOSIS — Z961 Presence of intraocular lens: Secondary | ICD-10-CM | POA: Diagnosis not present

## 2019-02-09 ENCOUNTER — Encounter (INDEPENDENT_AMBULATORY_CARE_PROVIDER_SITE_OTHER): Payer: Self-pay | Admitting: Ophthalmology

## 2019-02-09 ENCOUNTER — Ambulatory Visit (INDEPENDENT_AMBULATORY_CARE_PROVIDER_SITE_OTHER): Payer: Medicare Other | Admitting: Ophthalmology

## 2019-02-09 ENCOUNTER — Other Ambulatory Visit: Payer: Self-pay

## 2019-02-09 VITALS — BP 211/87

## 2019-02-09 DIAGNOSIS — I10 Essential (primary) hypertension: Secondary | ICD-10-CM

## 2019-02-09 DIAGNOSIS — H35033 Hypertensive retinopathy, bilateral: Secondary | ICD-10-CM | POA: Diagnosis not present

## 2019-02-09 DIAGNOSIS — H3581 Retinal edema: Secondary | ICD-10-CM | POA: Diagnosis not present

## 2019-02-09 DIAGNOSIS — Z961 Presence of intraocular lens: Secondary | ICD-10-CM

## 2019-02-09 DIAGNOSIS — Z6824 Body mass index (BMI) 24.0-24.9, adult: Secondary | ICD-10-CM | POA: Diagnosis not present

## 2019-02-09 NOTE — Progress Notes (Signed)
[facility] Note  02/09/2019     CHIEF COMPLAINT Patient presents for Retina Follow Up   HISTORY OF PRESENT ILLNESS: Kaitlyn Sanchez is a 84 y.o. female who presents to the clinic today for:   HPI    Retina Follow Up    Patient presents with  Other (HTN retinopathy).  In both eyes.  Severity is moderate.  Duration of 6 weeks.  Since onset it is stable.  I, the attending physician,  performed the HPI with the patient and updated documentation appropriately.          Comments    Patient states vision the same OU. Last BP was 161/65 yesterday. BP readings fluctuate a lot at home.  Has not heard from primary care doctor regarding elevated blood pressure.        Last edited by Bernarda Caffey, MD on 02/09/2019 11:29 PM. (History)    pt states her vision has been okay since last visit, pt states she checks her blood pressure at home and it has been a little high, but not as high as when we check it in the office, pt states she is only on one medication for BP  Referring physician: [name], MD [address] [place], [place] [address]  HISTORICAL INFORMATION:   Selected notes from the MEDICAL RECORD NUMBER Referred by Dr. Madelin Headings for concern of diabetic retinopathy LEE: 03.11.20 Jerilynn Mages. 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 (Ophthalmic Drugs)   No current facility-administered medications for this visit.  (Ophthalmic Drugs)   Current Outpatient Medications (Other)  Medication Sig  . acidophilus (RISAQUAD) CAPS capsule Take 1 capsule by mouth daily.  Marland Kitchen amLODipine (NORVASC) 10 MG tablet Take 5 mg by mouth 2 (two) times daily.  Marland Kitchen aspirin EC 325 MG tablet Take 81 mg by mouth daily.   Marland Kitchen docusate sodium (COLACE) 100 MG capsule Take 100 mg by mouth 3 (three) times daily.  .  Flaxseed, Linseed, (FLAXSEED OIL) 1000 MG CAPS Take 1,000 mg by mouth 2 (two) times daily.  Marland Kitchen glucosamine-chondroitin 500-400 MG tablet Take 1 tablet by mouth daily.  . hydrochlorothiazide (HYDRODIURIL) 25 MG tablet Take 25 mg by mouth daily.  Marland Kitchen levothyroxine (SYNTHROID, LEVOTHROID) 112 MCG tablet Take 100 mcg by mouth daily before breakfast.   . LORazepam (ATIVAN) 1 MG tablet Take 0.5 mg by mouth at bedtime as needed for anxiety.  . multivitamin-iron-minerals-folic acid (CENTRUM) chewable tablet Chew 1 tablet by mouth daily.  Marland Kitchen olmesartan (BENICAR) 20 MG tablet Take 20 mg by mouth daily.  . Omega 3 1200 MG CAPS Take 1,200 mg by mouth 2 (two) times daily.  Marland Kitchen omeprazole (PRILOSEC) 20 MG capsule Take 40 mg by mouth daily.   . potassium gluconate 595 MG TABS tablet Take 595 mg by mouth daily.  . Soy Isoflavones 40 MG TABS Take 40 mg by mouth daily.  . vitamin B-12 (CYANOCOBALAMIN) 500 MCG tablet Take 500 mcg by mouth daily.  . vitamin C (ASCORBIC ACID) 500 MG tablet Take 500 mg by mouth daily.   No current facility-administered medications for this visit.  (Other)      REVIEW OF SYSTEMS: ROS    Positive for: Endocrine, Eyes   Negative for: Constitutional, Gastrointestinal, Neurological, Skin, Genitourinary, Musculoskeletal, HENT, Cardiovascular, Respiratory, Psychiatric, Allergic/Imm, Heme/Lymph   Last edited by Stephanie Coup,  Daryl D on 02/09/2019  1:02 PM. (History)       ALLERGIES No Known Allergies  PAST MEDICAL HISTORY Past Medical History:  Diagnosis Date  . GERD (gastroesophageal reflux disease)   . Hypercholesterolemia   . Hypertension   . Hypothyroidism    Past Surgical History:  Procedure Laterality Date  . APPENDECTOMY    . CARPAL TUNNEL RELEASE    . CATARACT EXTRACTION W/PHACO Left 12/28/2013   Procedure: CATARACT EXTRACTION PHACO AND INTRAOCULAR LENS PLACEMENT (IOC);  Surgeon: Tonny Branch, MD;  Location: [facility];  Service: Ophthalmology;  Laterality: Left;  CDE 18.64  .  CATARACT EXTRACTION W/PHACO Right 01/07/2014   Procedure: CATARACT EXTRACTION PHACO AND INTRAOCULAR LENS PLACEMENT (IOC);  Surgeon: Tonny Branch, MD;  Location: [facility];  Service: Ophthalmology;  Laterality: Right;  CDE:11.01  . COLONOSCOPY    . ESOPHAGOGASTRODUODENOSCOPY N/A 10/11/2014   RMR:non critical schatzkis ring/HH  . LUMBAR LAMINECTOMY     X 2  . TONSILLECTOMY      FAMILY HISTORY Family History  Problem Relation Age of Onset  . Colon cancer Other        no first degree relatives  . Renal cancer Father   . Glaucoma Mother     SOCIAL HISTORY Social History   Tobacco Use  . Smoking status: Never Smoker  . Smokeless tobacco: Never Used  Substance Use Topics  . Alcohol use: No  . Drug use: No         OPHTHALMIC EXAM:  Base Eye Exam    Visual Acuity (Snellen - Linear)      Right Left   Dist cc 20/20 -2 20/20   Correction:  Glasses       Tonometry (Tonopen, 1:10 PM)      Right Left   Pressure 17 19       Pupils      Dark Light Shape React APD   Right 3 2 Round Brisk None   Left 3 2 Round Brisk None       Visual Fields (Counting fingers)      Left Right    Full Full       Extraocular Movement      Right Left    Full, Ortho Full, Ortho       Neuro/Psych    Oriented x3:  Yes   Mood/Affect:  Normal       Dilation    Both eyes:  1.0% Mydriacyl, 2.5% Phenylephrine @ 1:10 PM        Slit Lamp and Fundus Exam    Slit Lamp Exam      Right Left   Lids/Lashes Dermatochalasis - upper lid, Ptosis Dermatochalasis - upper lid, Ptosis   Conjunctiva/Sclera mild temporal Pinguecula mild nasal temporal Pinguecula   Cornea Arcus, mild Debris in tear film, 1+ Punctate epithelial erosions, Well healed cataract wounds Arcus, mild Debris in tear film, 1+ Punctate epithelial erosions, Well healed cataract wounds, fine endo pigment inferiorly   Anterior Chamber Deep and quiet Deep and quiet   Iris Round and moderately dilated Round and poorly dilated to 4.35m   Lens  PC IOL in good position with open PC PC IOL in good position with open PC   Vitreous Vitreous syneresis, Posterior vitreous detachment mild Vitreous syneresis, Posterior vitreous detachment       Fundus Exam      Right Left   Disc Pink and Sharp, +SVP Pink and Sharp   C/D Ratio 0.3 0.35  Macula Blunted foveal reflex, scattered paracentral IRH/DBH Flat, Blunted foveal reflex, mild RPE mottling, rare Microaneurysms, no edema   Vessels Vascular attenuation, AV crossing changes Vascular attenuation, AV crossing changes   Periphery Attached, no heme Attached, blot heme along IN arcade        Refraction    Wearing Rx      Sphere Cylinder Add   Right -0.25 Sphere +2.25   Left -0.25 Sphere +2.25   Type:  PAL          IMAGING AND PROCEDURES  Imaging and Procedures for _0 @  OCT, Retina - OU - Both Eyes       Right Eye Quality was good. Central Foveal Thickness: 273. Progression has been stable. Findings include normal foveal contour, no SRF, intraretinal fluid (Mild interval improvement in IRF/cystic changes).   Left Eye Quality was good. Central Foveal Thickness: 264. Progression has been stable. Findings include normal foveal contour, no IRF, no SRF.   Notes *Images captured and stored on drive  Diagnosis / Impression:  OD: NFP, no SRF, Mild interval improvement in IRF/cystic changes OS: NFP, no IRF/SRF   Clinical management:  See below  Abbreviations: NFP - Normal foveal profile. CME - cystoid macular edema. PED - pigment epithelial detachment. IRF - intraretinal fluid. SRF - subretinal fluid. EZ - ellipsoid zone. ERM - epiretinal membrane. ORA - outer retinal atrophy. ORT - outer retinal tubulation. SRHM - subretinal hyper-reflective material                 ASSESSMENT/PLAN:    ICD-10-CM   1. Hypertensive retinopathy of both eyes H35.033   2. Essential hypertension I10   3. Retinal edema H35.81 OCT, Retina - OU - Both Eyes  4. Pseudophakia of both eyes  Z96.1     1-3. Hypertensive retinopathy with retinal edema (OD > OS)  - focal intraretinal hemes in macula OU (OD > OS)  - OCT shows Mild interval improvement in IRF/cystic changes OD  - FA shows focal MA in macula with late leakage OU    - BP in office measured 211/87 -- pt asymptomatic (no headache, numbness, tingling)  - pt reports labile BP at home  - discussed importance of tight BP control  - recommend further evaluation/managment by PCP -- will send letter to Dr. Hilma Favors  - monitor for now  - F/u 2-3 months DFE/OCT  4. Pseudophakia OU  - s/p CE/IOL OU - Dr. Tonny Branch (2015)  - beautiful surgeries, doing well  - monitor    Ophthalmic Meds Ordered this visit:  No orders of the defined types were placed in this encounter.      Return for f/u 2-3 months HTN ret, DFE, OCT.  There are no Patient Instructions on file for this visit.   Explained the diagnoses, plan, and follow up with the patient and they expressed understanding.  Patient expressed understanding of the importance of proper follow up care.   This document serves as a record of services personally performed by Gardiner Sleeper, MD, PhD. It was created on their behalf by Ernest Mallick, OA, an ophthalmic assistant. The creation of this record is the provider's dictation and/or activities during the visit.    Electronically signed by: Ernest Mallick, OA  05.04.2020 11:29 PM    Gardiner Sleeper, M.D., Ph.D. Diseases & Surgery of the Retina and Vitreous [facility]  I have reviewed the above documentation for accuracy and completeness, and I agree with  the above. Gardiner Sleeper, M.D., Ph.D. 02/09/19 11:34 PM    Abbreviations: M myopia (nearsighted); A astigmatism; H hyperopia (farsighted); P presbyopia; Mrx spectacle prescription;  CTL contact lenses; OD right eye; OS left eye; OU both eyes  XT exotropia; ET esotropia; PEK punctate epithelial keratitis; PEE punctate epithelial erosions; DES  dry eye syndrome; MGD meibomian gland dysfunction; ATs artificial tears; PFAT's preservative free artificial tears; [place] nuclear sclerotic cataract; PSC posterior subcapsular cataract; ERM epi-retinal membrane; PVD posterior vitreous detachment; RD retinal detachment; DM diabetes mellitus; DR diabetic retinopathy; NPDR non-proliferative diabetic retinopathy; PDR proliferative diabetic retinopathy; CSME clinically significant macular edema; DME diabetic macular edema; dbh dot blot hemorrhages; CWS cotton wool spot; POAG primary open angle glaucoma; C/D cup-to-disc ratio; HVF humphrey visual field; GVF goldmann visual field; OCT optical coherence tomography; IOP intraocular pressure; BRVO Branch retinal vein occlusion; CRVO central retinal vein occlusion; CRAO central retinal artery occlusion; BRAO branch retinal artery occlusion; RT retinal tear; SB scleral buckle; PPV pars plana vitrectomy; VH Vitreous hemorrhage; PRP panretinal laser photocoagulation; IVK intravitreal kenalog; VMT vitreomacular traction; MH Macular hole;  NVD neovascularization of the disc; NVE neovascularization elsewhere; AREDS age related eye disease study; ARMD age related macular degeneration; POAG primary open angle glaucoma; EBMD epithelial/anterior basement membrane dystrophy; ACIOL anterior chamber intraocular lens; IOL intraocular lens; PCIOL posterior chamber intraocular lens; Phaco/IOL phacoemulsification with intraocular lens placement; [place] photorefractive keratectomy; LASIK laser assisted in situ keratomileusis; HTN hypertension; DM diabetes mellitus; COPD chronic obstructive pulmonary disease

## 2019-02-12 ENCOUNTER — Encounter: Payer: Self-pay | Admitting: Cardiology

## 2019-02-12 ENCOUNTER — Telehealth: Payer: Self-pay | Admitting: Cardiology

## 2019-02-12 NOTE — Telephone Encounter (Signed)
Virtual Visit Pre-Appointment Phone Call  "(Name), I am calling you today to discuss your upcoming appointment. We are currently trying to limit exposure to the virus that causes COVID-19 by seeing patients at home rather than in the office."  1. "What is the BEST phone number to call the day of the visit?" - include this in appointment notes  2. Do you have or have access to (through a family member/friend) a smartphone with video capability that we can use for your visit?" a. If yes - list this number in appt notes as cell (if different from BEST phone #) and list the appointment type as a VIDEO visit in appointment notes b. If no - list the appointment type as a PHONE visit in appointment notes  3. Confirm consent - "In the setting of the current Covid19 crisis, you are scheduled for a (phone or video) visit with your provider on (date) at (time).  Just as we do with many in-office visits, in order for you to participate in this visit, we must obtain consent.  If you'd like, I can send this to your mychart (if signed up) or email for you to review.  Otherwise, I can obtain your verbal consent now.  All virtual visits are billed to your insurance company just like a normal visit would be.  By agreeing to a virtual visit, we'd like you to understand that the technology does not allow for your provider to perform an examination, and thus may limit your provider's ability to fully assess your condition. If your provider identifies any concerns that need to be evaluated in person, we will make arrangements to do so.  Finally, though the technology is pretty good, we cannot assure that it will always work on either your or our end, and in the setting of a video visit, we may have to convert it to a phone-only visit.  In either situation, we cannot ensure that we have a secure connection.  Are you willing to proceed?" STAFF: Did the patient verbally acknowledge consent to telehealth visit? Document  YES/NO here: Yes  4. Advise patient to be prepared - "Two hours prior to your appointment, go ahead and check your blood pressure, pulse, oxygen saturation, and your weight (if you have the equipment to check those) and write them all down. When your visit starts, your provider will ask you for this information. If you have an Apple Watch or Kardia device, please plan to have heart rate information ready on the day of your appointment. Please have a pen and paper handy nearby the day of the visit as well."  5. Give patient instructions for MyChart download to smartphone OR [email or web address] as below if video visit (depending on what platform provider is using)  6. Inform patient they will receive a phone call 15 minutes prior to their appointment time (may be from unknown caller ID) so they should be prepared to answer    [place] has been deemed a candidate for a follow-up tele-health visit to limit community exposure during the Covid-19 pandemic. I spoke with the patient via phone to ensure availability of phone/video source, confirm preferred email & phone number, and discuss instructions and expectations.  I reminded Kaitlyn Sanchez to be prepared with any vital sign and/or heart rhythm information that could potentially be obtained via home monitoring, at the time of her visit. I reminded Kaitlyn Sanchez to expect a phone call prior to  her visit.  [place] L Goins 02/12/2019 10:29 AM

## 2019-02-12 NOTE — Progress Notes (Signed)
Virtual Visit via Telephone Note   This visit type was conducted due to national recommendations for restrictions regarding the COVID-19 Pandemic (e.g. social distancing) in an effort to limit this patient's exposure and mitigate transmission in our community.  Due to her co-morbid illnesses, this patient is at least at moderate risk for complications without adequate follow up.  This format is felt to be most appropriate for this patient at this time.  The patient did not have access to video technology/had technical difficulties with video requiring transitioning to audio format only (telephone).  All issues noted in this document were discussed and addressed.  No physical exam could be performed with this format.  Please refer to the patient's chart for her  consent to telehealth for [facility].   Date:  02/13/2019   ID:  Kaitlyn Sanchez, DOB 01-Aug-1935, MRN [id number]  Patient Location: Home Provider Location: Office  PCP:  [name], MD  Consulting Cardiologist: Satira Sark, MD  Evaluation Performed:  New Patient Evaluation  Chief Complaint:  Hypertension  History of Present Illness:    Kaitlyn Sanchez is an 84 y.o. female referred for cardiology consultation by Mr. Eston Mould for assistance with management of hypertension.  She did not have video access today and we spoke by phone.  Based on available records she was seen for an ophthalmology evaluation for retinal hemorrhage and referred to her PCP with uncontrolled high blood pressure, systolics in the 062B.  She is on olmesartan at baseline, was given as needed clonidine to take at her recent visit with [facility].  Olmesartan was also increased to 20 mg twice daily.  She states that she has had a history of hypertension for several years.  In fact, she was previously on Norvasc and HCTZ along with her olmesartan, but those medications were discontinued by Dr. Hilma Favors a few years ago, apparently in an attempt to simplify her  medications in the setting of very good blood pressure control.  She states that she lives in her own home and functions with all ADLs.  She has a daughter that lives with her.  She does not report any exertional chest pain or breathlessness beyond NYHA class II.  She has had no palpitations or syncope.  She reports having lab work in July 2019, nothing more recent.  There is no recent lab work or ECG available for review.  She had a cardiac work-up at [facility] in 2013.  Cardiac catheterization demonstrated only mild coronary atherosclerosis and echocardiogram showed normal LVEF with mild tricuspid regurgitation and mildly elevated PASP.  I reviewed several of her blood pressure measurements made in the week since she increased her olmesartan.  She has used as needed clonidine only a few times for blood pressure spikes.  On average blood pressure has been ranging 140-160 / 50-60.  The patient does not have symptoms concerning for COVID-19 infection (fever, chills, cough, or new shortness of breath).  She states that she has been social distancing.  She wears a mask when she goes out to the grocery store.  Past Medical History:  Diagnosis Date  . Essential hypertension   . GERD (gastroesophageal reflux disease)   . History of cardiac catheterization 2013   Mild coronary atherosclerosis - [facility]  . Hyperlipidemia   . Hypothyroidism    Past Surgical History:  Procedure Laterality Date  . APPENDECTOMY    . CARPAL TUNNEL RELEASE    . CATARACT EXTRACTION W/PHACO Left 12/28/2013   Procedure:  CATARACT EXTRACTION PHACO AND INTRAOCULAR LENS PLACEMENT (IOC);  Surgeon: Tonny Branch, MD;  Location: [facility];  Service: Ophthalmology;  Laterality: Left;  CDE 18.64  . CATARACT EXTRACTION W/PHACO Right 01/07/2014   Procedure: CATARACT EXTRACTION PHACO AND INTRAOCULAR LENS PLACEMENT (IOC);  Surgeon: Tonny Branch, MD;  Location: [facility];  Service: Ophthalmology;  Laterality: Right;  CDE:11.01  . COLONOSCOPY    .  ESOPHAGOGASTRODUODENOSCOPY N/A 10/11/2014   RMR:non critical schatzkis ring/HH  . LUMBAR LAMINECTOMY     X 2  . TONSILLECTOMY       Current Meds  Medication Sig  . acidophilus (RISAQUAD) CAPS capsule Take 1 capsule by mouth daily.  Marland Kitchen aspirin EC 81 MG tablet Take 243 mg by mouth at bedtime.  . Biotin w/ Vitamins C & E (HAIR/SKIN/NAILS PO) Take 1 tablet by mouth daily.  . Calcium Carbonate-Vit D-Min (CALTRATE 600+D PLUS PO) Take 1 tablet by mouth daily.  . cloNIDine (CATAPRES) 0.1 MG tablet Take 1 tablet by mouth 2 (two) times daily as needed. If SPB > 160  . docusate sodium (COLACE) 100 MG capsule Take 300 mg by mouth at bedtime.   . Flaxseed, Linseed, (FLAXSEED OIL) 1000 MG CAPS Take 1,000 mg by mouth daily.   Marland Kitchen glucosamine-chondroitin 500-400 MG tablet Take 1 tablet by mouth daily.  Marland Kitchen levothyroxine (SYNTHROID) 100 MCG tablet Take 100 mcg by mouth daily before breakfast.  . LORazepam (ATIVAN) 1 MG tablet Take 0.5 mg by mouth at bedtime as needed for anxiety.  . multivitamin-iron-minerals-folic acid (CENTRUM) chewable tablet Chew 1 tablet by mouth daily.  Marland Kitchen olmesartan (BENICAR) 20 MG tablet Take 20 mg by mouth 2 (two) times a day.   . Omega 3 1200 MG CAPS Take 1,200 mg by mouth 2 (two) times daily.  . potassium gluconate 595 MG TABS tablet Take 595 mg by mouth daily.  . vitamin B-12 (CYANOCOBALAMIN) 1000 MCG tablet Take 1,000 mcg by mouth daily.  . vitamin C (ASCORBIC ACID) 500 MG tablet Take 500 mg by mouth daily.     Allergies:   Patient has no known allergies.   Social History   Tobacco Use  . Smoking status: Never Smoker  . Smokeless tobacco: Never Used  Substance Use Topics  . Alcohol use: No  . Drug use: No     Family Hx: The patient's family history includes Colon cancer in an other family member; Glaucoma in her mother; Renal cancer in her father.  ROS:   Please see the history of present illness.    All other systems reviewed and are negative.   Prior CV studies:    The following studies were reviewed today:  Carotid Dopplers 12/24/2018: IMPRESSION: Less than 50% stenosis in the right and left internal carotid arteries.  Labs/Other Tests and Data Reviewed:    EKG:  An ECG dated 12/22/2013 was personally reviewed today and demonstrated:  Normal sinus rhythm.  Recent Labs:  November 2016: Potassium 4.0, BUN 16, creatinine 0.62, AST 20, ALT 14, hemoglobin 12.2, platelets 270  Wt Readings from Last 3 Encounters:  02/13/19 132 lb (59.9 kg)  09/06/15 134 lb 6.4 oz (61 kg)  08/19/15 132 lb 3.2 oz (60 kg)     Objective:    Vital Signs:  BP (!) 156/60   Pulse 63   Ht 5\' 2"  (1.575 m)   Wt 132 lb (59.9 kg)   BMI 24.14 kg/m    Other blood pressures since Monday: 186 64 143/55 158/56 161/60   Patient  spoke in complete sentences without breathlessness.  Voice tone and speech pattern were normal.  She walked across her house to get a list while we were on the phone and was not short of breath speaking during that time.  No audible wheezing.  ASSESSMENT & PLAN:    1.  Suspected essential hypertension with suboptimal control recently as outlined above.  Agree with increase in olmesartan to 20 mg twice daily.  In the past she had been on Norvasc and HCTZ, both stopped a few years ago by Dr. Hilma Favors.  We talked about salt restriction.  We will obtain a BMET, TSH, and urinalysis.  Based on these results and her subsequent blood pressure checks over the next few weeks, determination can be made whether the addition of chlorthalidone or Aldactone could be considered.  Otherwise, not clear that further cardiac testing is necessarily indicated at this point.  2.  Previous cardiac work-up in 2013 including cardiac catheterization at the [facility] showing only mild coronary atherosclerosis.  She does not report any active chest pain or unusual shortness of breath with activity.  She is on aspirin daily along with flaxseed oil extract.  3.  History of  hypothyroidism, currently on Synthroid.  Follow-up TSH will also be obtained.  COVID-19 Education: The signs and symptoms of COVID-19 were discussed with the patient and how to seek care for testing (follow up with PCP or arrange E-visit).  The importance of social distancing was discussed today.  Time:   Today, I have spent 12 minutes with the patient with telehealth technology discussing the above problems.     Medication Adjustments/Labs and Tests Ordered: Current medicines are reviewed at length with the patient today.  Concerns regarding medicines are outlined above.   Tests Ordered: Orders Placed This Encounter  Procedures  . Basic metabolic panel  . TSH  . Urinalysis    Medication Changes: No orders of the defined types were placed in this encounter.   Disposition:  Follow up test results and determine next step.  Signed, Rozann Lesches, MD  02/13/2019 1:55 PM    [place]

## 2019-02-13 ENCOUNTER — Telehealth (INDEPENDENT_AMBULATORY_CARE_PROVIDER_SITE_OTHER): Payer: Medicare Other | Admitting: Cardiology

## 2019-02-13 ENCOUNTER — Encounter: Payer: Self-pay | Admitting: Cardiology

## 2019-02-13 VITALS — BP 156/60 | HR 63 | Ht 62.0 in | Wt 132.0 lb

## 2019-02-13 DIAGNOSIS — E039 Hypothyroidism, unspecified: Secondary | ICD-10-CM

## 2019-02-13 DIAGNOSIS — Z7189 Other specified counseling: Secondary | ICD-10-CM | POA: Diagnosis not present

## 2019-02-13 DIAGNOSIS — I10 Essential (primary) hypertension: Secondary | ICD-10-CM | POA: Diagnosis not present

## 2019-02-13 DIAGNOSIS — I2510 Atherosclerotic heart disease of native coronary artery without angina pectoris: Secondary | ICD-10-CM | POA: Diagnosis not present

## 2019-02-13 NOTE — Patient Instructions (Addendum)
Medication Instructions:   Your physician recommends that you continue on your current medications as directed. Please refer to the Current Medication list given to you today.  Labwork:  Your physician recommends that you return for lab work to check your BMET, TSH & Urinalysis. Please have this done at [facility] or [facility].  Testing/Procedures:  NONE  Follow-Up:  Your physician recommends that you schedule a follow-up appointment in: pending test results.  Any Other Special Instructions Will Be Listed Below (If Applicable).  If you need a refill on your cardiac medications before your next appointment, please call your pharmacy.

## 2019-02-17 ENCOUNTER — Telehealth: Payer: Self-pay | Admitting: Cardiology

## 2019-02-17 NOTE — Telephone Encounter (Signed)
Spoke with pt, she states she is going to see her PCP on Friday and she will have labs done then. Asked Jarrett Soho to fax copy of labs slips to [facility].

## 2019-02-17 NOTE — Telephone Encounter (Signed)
Called pt. No answer, no voicemail. (home/cell)

## 2019-02-17 NOTE — Telephone Encounter (Signed)
Pt would like to know when she should have her labs done, states she wasn't given a date.

## 2019-02-19 DIAGNOSIS — S335XXA Sprain of ligaments of lumbar spine, initial encounter: Secondary | ICD-10-CM | POA: Diagnosis not present

## 2019-02-19 DIAGNOSIS — M9903 Segmental and somatic dysfunction of lumbar region: Secondary | ICD-10-CM | POA: Diagnosis not present

## 2019-02-19 DIAGNOSIS — M4004 Postural kyphosis, thoracic region: Secondary | ICD-10-CM | POA: Diagnosis not present

## 2019-02-19 DIAGNOSIS — M9902 Segmental and somatic dysfunction of thoracic region: Secondary | ICD-10-CM | POA: Diagnosis not present

## 2019-02-19 DIAGNOSIS — M9901 Segmental and somatic dysfunction of cervical region: Secondary | ICD-10-CM | POA: Diagnosis not present

## 2019-02-19 DIAGNOSIS — S134XXA Sprain of ligaments of cervical spine, initial encounter: Secondary | ICD-10-CM | POA: Diagnosis not present

## 2019-02-20 DIAGNOSIS — I10 Essential (primary) hypertension: Secondary | ICD-10-CM | POA: Diagnosis not present

## 2019-02-20 DIAGNOSIS — Z6824 Body mass index (BMI) 24.0-24.9, adult: Secondary | ICD-10-CM | POA: Diagnosis not present

## 2019-02-23 ENCOUNTER — Encounter: Payer: Self-pay | Admitting: Family Medicine

## 2019-02-24 ENCOUNTER — Other Ambulatory Visit (HOSPITAL_COMMUNITY)
Admission: RE | Admit: 2019-02-24 | Discharge: 2019-02-24 | Disposition: A | Discharge status: Home / Self Care | Payer: Medicare Other | Source: Ambulatory Visit | Attending: Cardiology | Admitting: Cardiology

## 2019-02-24 DIAGNOSIS — I10 Essential (primary) hypertension: Secondary | ICD-10-CM | POA: Insufficient documentation

## 2019-02-24 LAB — BASIC METABOLIC PANEL
Anion gap: 7 (ref 5–15)
BUN: 19 mg/dL (ref 8–23)
CO2: 30 mmol/L (ref 22–32)
Calcium: 9.8 mg/dL (ref 8.9–10.3)
Chloride: 100 mmol/L (ref 98–111)
Creatinine, Ser: 0.73 mg/dL (ref 0.44–1.00)
GFR calc Af Amer: >60 mL/min (ref >60)
GFR calc non Af Amer: >60 mL/min (ref >60)
Glucose, Bld: 99 mg/dL (ref 70–99)
Potassium: 4 mmol/L (ref 3.5–5.1)
Sodium: 137 mmol/L (ref 135–145)

## 2019-02-24 LAB — URINALYSIS, ROUTINE W REFLEX MICROSCOPIC
Bacteria, UA: NONE SEEN
Bilirubin Urine: NEGATIVE
Glucose, UA: NEGATIVE mg/dL
Hgb urine dipstick: NEGATIVE
Ketones, ur: 5 mg/dL — AB
Nitrite: NEGATIVE
Protein, ur: NEGATIVE mg/dL
Specific Gravity, Urine: 1.023 (ref 1.005–1.030)
pH: 5 (ref 5.0–8.0)

## 2019-02-24 LAB — TSH: TSH: 12.285 u[IU]/mL — ABNORMAL HIGH (ref 0.350–4.500)

## 2019-03-04 DIAGNOSIS — I10 Essential (primary) hypertension: Secondary | ICD-10-CM | POA: Diagnosis not present

## 2019-03-04 DIAGNOSIS — Z6824 Body mass index (BMI) 24.0-24.9, adult: Secondary | ICD-10-CM | POA: Diagnosis not present

## 2019-03-04 DIAGNOSIS — E039 Hypothyroidism, unspecified: Secondary | ICD-10-CM | POA: Diagnosis not present

## 2019-03-16 ENCOUNTER — Other Ambulatory Visit (HOSPITAL_COMMUNITY): Payer: Self-pay | Admitting: Family Medicine

## 2019-03-16 DIAGNOSIS — Z1231 Encounter for screening mammogram for malignant neoplasm of breast: Secondary | ICD-10-CM

## 2019-04-01 DIAGNOSIS — I10 Essential (primary) hypertension: Secondary | ICD-10-CM | POA: Diagnosis not present

## 2019-04-01 DIAGNOSIS — L03032 Cellulitis of left toe: Secondary | ICD-10-CM | POA: Diagnosis not present

## 2019-04-06 ENCOUNTER — Ambulatory Visit (HOSPITAL_COMMUNITY)
Admission: RE | Admit: 2019-04-06 | Discharge: 2019-04-06 | Disposition: A | Discharge status: Home / Self Care | Payer: Medicare Other | Source: Ambulatory Visit | Attending: Family Medicine | Admitting: Family Medicine

## 2019-04-06 ENCOUNTER — Other Ambulatory Visit: Payer: Self-pay

## 2019-04-06 DIAGNOSIS — Z1231 Encounter for screening mammogram for malignant neoplasm of breast: Secondary | ICD-10-CM | POA: Diagnosis not present

## 2019-04-08 DIAGNOSIS — E039 Hypothyroidism, unspecified: Secondary | ICD-10-CM | POA: Diagnosis not present

## 2019-04-08 DIAGNOSIS — Z6824 Body mass index (BMI) 24.0-24.9, adult: Secondary | ICD-10-CM | POA: Diagnosis not present

## 2019-04-08 DIAGNOSIS — I10 Essential (primary) hypertension: Secondary | ICD-10-CM | POA: Diagnosis not present

## 2019-04-12 NOTE — Progress Notes (Signed)
[facility] Note  04/13/2019     CHIEF COMPLAINT Patient presents for Retina Follow Up   HISTORY OF PRESENT ILLNESS: Kaitlyn Sanchez is a 84 y.o. female who presents to the clinic today for:   HPI    Retina Follow Up    Patient presents with  Other.  In both eyes.  This started 2 months ago.  Severity is moderate.  I, the attending physician,  performed the HPI with the patient and updated documentation appropriately.          Comments    Patient here for 2 months retina follow up for  HTN ret with ret edema OU.  Patient states vision seems to be doing ok. No changes. No eye pain.  Added Amlodipine 10 mg and levothyroxine upped to 125 mg and olmesartan upped to 40 mg.        Last edited by Bernarda Caffey, MD on 04/13/2019  3:06 PM. (History)    pt states her cardiologist sent her back to her PCP and they adjusted her meds and have gotten her BP under control, she states her thyroid got out of control between March-May and they had to increase the dosage of her medication   Referring physician: [name], MD [address] [place],   [id number]  HISTORICAL INFORMATION:   Selected notes from the [facility] Referred by Dr. Madelin Headings for concern of diabetic retinopathy LEE: 03.11.20 Jerilynn Mages. 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 (Ophthalmic Drugs)   No current facility-administered medications for this visit.  (Ophthalmic Drugs)   Current Outpatient Medications (Other)  Medication Sig  . acidophilus (RISAQUAD) CAPS capsule Take 1 capsule by mouth daily.  Marland Kitchen aspirin EC 81 MG tablet Take 243 mg by mouth at bedtime.  . Biotin w/ Vitamins C & E (HAIR/SKIN/NAILS PO) Take 1 tablet by mouth daily.  . Calcium Carbonate-Vit D-Min (CALTRATE 600+D PLUS PO) Take 1 tablet  by mouth daily.  . cloNIDine (CATAPRES) 0.1 MG tablet Take 1 tablet by mouth 2 (two) times daily as needed. If SPB > 160  . docusate sodium (COLACE) 100 MG capsule Take 300 mg by mouth at bedtime.   . Flaxseed, Linseed, (FLAXSEED OIL) 1000 MG CAPS Take 1,000 mg by mouth daily.   Marland Kitchen glucosamine-chondroitin 500-400 MG tablet Take 1 tablet by mouth daily.  Marland Kitchen levothyroxine (SYNTHROID) 100 MCG tablet Take 100 mcg by mouth daily before breakfast.  . LORazepam (ATIVAN) 1 MG tablet Take 0.5 mg by mouth at bedtime as needed for anxiety.  . multivitamin-iron-minerals-folic acid (CENTRUM) chewable tablet Chew 1 tablet by mouth daily.  Marland Kitchen olmesartan (BENICAR) 20 MG tablet Take 20 mg by mouth 2 (two) times a day.   . Omega 3 1200 MG CAPS Take 1,200 mg by mouth 2 (two) times daily.  . potassium gluconate 595 MG TABS tablet Take 595 mg by mouth daily.  . vitamin B-12 (CYANOCOBALAMIN) 1000 MCG tablet Take 1,000 mcg by mouth daily.  . vitamin C (ASCORBIC ACID) 500 MG tablet Take 500 mg by mouth daily.   No current facility-administered medications for this visit.  (Other)      REVIEW OF SYSTEMS: ROS    Positive for: Endocrine, Eyes   Negative for: Constitutional, Gastrointestinal, Neurological, Skin, Genitourinary, Musculoskeletal, HENT, Cardiovascular, Respiratory, Psychiatric, Allergic/Imm, Heme/Lymph  Last edited by Theodore Demark on 04/13/2019  2:25 PM. (History)       ALLERGIES No Known Allergies  PAST MEDICAL HISTORY Past Medical History:  Diagnosis Date  . Essential hypertension   . GERD (gastroesophageal reflux disease)   . History of cardiac catheterization 2013   Mild coronary atherosclerosis - [facility]  . Hyperlipidemia   . Hypothyroidism    Past Surgical History:  Procedure Laterality Date  . APPENDECTOMY    . CARPAL TUNNEL RELEASE    . CATARACT EXTRACTION W/PHACO Left 12/28/2013   Procedure: CATARACT EXTRACTION PHACO AND INTRAOCULAR LENS PLACEMENT (IOC);  Surgeon: Tonny Branch,  MD;  Location: [facility];  Service: Ophthalmology;  Laterality: Left;  CDE 18.64  . CATARACT EXTRACTION W/PHACO Right 01/07/2014   Procedure: CATARACT EXTRACTION PHACO AND INTRAOCULAR LENS PLACEMENT (IOC);  Surgeon: Tonny Branch, MD;  Location: [facility];  Service: Ophthalmology;  Laterality: Right;  CDE:11.01  . COLONOSCOPY    . ESOPHAGOGASTRODUODENOSCOPY N/A 10/11/2014   RMR:non critical schatzkis ring/HH  . LUMBAR LAMINECTOMY     X 2  . TONSILLECTOMY      FAMILY HISTORY Family History  Problem Relation Age of Onset  . Colon cancer Other        no first degree relatives  . Renal cancer Father   . Glaucoma Mother     SOCIAL HISTORY Social History   Tobacco Use  . Smoking status: Never Smoker  . Smokeless tobacco: Never Used  Substance Use Topics  . Alcohol use: No  . Drug use: No         OPHTHALMIC EXAM:  Base Eye Exam    Visual Acuity (Snellen - Linear)      Right Left   Dist cc 20/20 20/20 -1   Correction: Glasses       Tonometry (Tonopen, 2:21 PM)      Right Left   Pressure 17 15       Pupils      Dark Light Shape React APD   Right 3 2 Round Brisk None   Left 3 2 Round Brisk None       Visual Fields (Counting fingers)      Left Right    Full Full       Extraocular Movement      Right Left    Full, Ortho Full, Ortho       Neuro/Psych    Oriented x3: Yes   Mood/Affect: Normal       Dilation    Both eyes: 1.0% Mydriacyl, 2.5% Phenylephrine @ 2:21 PM        Slit Lamp and Fundus Exam    Slit Lamp Exam      Right Left   Lids/Lashes Dermatochalasis - upper lid, Ptosis Dermatochalasis - upper lid, Ptosis   Conjunctiva/Sclera mild temporal Pinguecula mild nasal temporal Pinguecula   Cornea Arcus, mild Debris in tear film, 1+ Punctate epithelial erosions, Well healed cataract wounds Arcus, mild Debris in tear film, 1+ Punctate epithelial erosions, Well healed cataract wounds, fine endo pigment inferiorly   Anterior Chamber Deep and quiet Deep and quiet    Iris Round and dilated Round and poorly dilated to 4.75m   Lens PC IOL in good position with open PC PC IOL in good position with open PC   Vitreous Vitreous syneresis, Posterior vitreous detachment mild Vitreous syneresis, Posterior vitreous detachment       Fundus Exam      Right Left   Disc Pink  and Sharp Pink and Sharp   C/D Ratio 0.4 0.35   Macula Blunted foveal reflex, scattered paracentral IRH/DBH - persistent to slight improved Flat, Blunted foveal reflex, mild RPE mottling, trace Epiretinal membrane, No heme or edema   Vessels Vascular attenuation, mild tortuosity, AV crossing changes, mild Copper wiring Vascular attenuation, mild Tortuousity   Periphery Attached, no heme Attached, blot heme along IN arcade        Refraction    Wearing Rx      Sphere Cylinder Add   Right -0.25 Sphere +2.25   Left -0.25 Sphere +2.25   Type: PAL          IMAGING AND PROCEDURES  Imaging and Procedures for _0 @  OCT, Retina - OU - Both Eyes       Right Eye Quality was good. Central Foveal Thickness: 271. Progression has been stable. Findings include normal foveal contour, no SRF, intraretinal fluid, intraretinal hyper-reflective material (Trace persistent cystic changes).   Left Eye Quality was good. Central Foveal Thickness: 263. Progression has been stable. Findings include normal foveal contour, no IRF, no SRF.   Notes *Images captured and stored on drive  Diagnosis / Impression:  OD: NFP, no SRF, trace persistent cystic changes OS: NFP, no IRF/SRF   Clinical management:  See below  Abbreviations: NFP - Normal foveal profile. CME - cystoid macular edema. PED - pigment epithelial detachment. IRF - intraretinal fluid. SRF - subretinal fluid. EZ - ellipsoid zone. ERM - epiretinal membrane. ORA - outer retinal atrophy. ORT - outer retinal tubulation. SRHM - subretinal hyper-reflective material                 ASSESSMENT/PLAN:    ICD-10-CM   1. Hypertensive  retinopathy of both eyes  H35.033   2. Essential hypertension  I10   3. Retinal edema  H35.81 OCT, Retina - OU - Both Eyes  4. Pseudophakia of both eyes  Z96.1     1-3. Hypertensive retinopathy OU  - focal intraretinal hemes in macula OU (OD > OS) -- improved from prior  - OCT shows trace persistent cystic changes OD  - FA 3.23.20 shows focal MA in macula with late leakage OU    - BP in office measured 169/73 today (7.7.2020) -- improved from 211/87 at prior visit 5.4.2020  - meds adjusted by PCP and cardiology  - discussed importance of tight BP control  - monitor for now  - F/u 4-6 months DFE/OCT  4. Pseudophakia OU  - s/p CE/IOL OU - Dr. Tonny Branch (2015)  - beautiful surgeries, doing well  - monitor    Ophthalmic Meds Ordered this visit:  No orders of the defined types were placed in this encounter.      Return for f/u 4-6 months, HTN ret OU, DFE, OCT.  There are no Patient Instructions on file for this visit.   Explained the diagnoses, plan, and follow up with the patient and they expressed understanding.  Patient expressed understanding of the importance of proper follow up care.   This document serves as a record of services personally performed by Gardiner Sleeper, MD, PhD. It was created on their behalf by Ernest Mallick, OA, an ophthalmic assistant. The creation of this record is the provider's dictation and/or activities during the visit.    Electronically signed by: Ernest Mallick, OA 07.05.2020 8:18 AM     Gardiner Sleeper, M.D., Ph.D. Diseases & Surgery of the Retina and [place]  I have reviewed the above documentation for accuracy and completeness, and I agree with the above. Gardiner Sleeper, M.D., Ph.D. 04/14/19 8:18 AM     Abbreviations: M myopia (nearsighted); A astigmatism; H hyperopia (farsighted); P presbyopia; Mrx spectacle prescription;  CTL contact lenses; OD right eye; OS left eye; OU both eyes  XT exotropia;  ET esotropia; PEK punctate epithelial keratitis; PEE punctate epithelial erosions; DES dry eye syndrome; MGD meibomian gland dysfunction; ATs artificial tears; PFAT's preservative free artificial tears; [place] nuclear sclerotic cataract; PSC posterior subcapsular cataract; ERM epi-retinal membrane; PVD posterior vitreous detachment; RD retinal detachment; DM diabetes mellitus; DR diabetic retinopathy; NPDR non-proliferative diabetic retinopathy; PDR proliferative diabetic retinopathy; CSME clinically significant macular edema; DME diabetic macular edema; dbh dot blot hemorrhages; CWS cotton wool spot; POAG primary open angle glaucoma; C/D cup-to-disc ratio; HVF humphrey visual field; GVF goldmann visual field; OCT optical coherence tomography; IOP intraocular pressure; BRVO Branch retinal vein occlusion; CRVO central retinal vein occlusion; CRAO central retinal artery occlusion; BRAO branch retinal artery occlusion; RT retinal tear; SB scleral buckle; PPV pars plana vitrectomy; VH Vitreous hemorrhage; PRP panretinal laser photocoagulation; IVK intravitreal kenalog; VMT vitreomacular traction; MH Macular hole;  NVD neovascularization of the disc; NVE neovascularization elsewhere; AREDS age related eye disease study; ARMD age related macular degeneration; POAG primary open angle glaucoma; EBMD epithelial/anterior basement membrane dystrophy; ACIOL anterior chamber intraocular lens; IOL intraocular lens; PCIOL posterior chamber intraocular lens; Phaco/IOL phacoemulsification with intraocular lens placement; [place] photorefractive keratectomy; LASIK laser assisted in situ keratomileusis; HTN hypertension; DM diabetes mellitus; COPD chronic obstructive pulmonary disease

## 2019-04-13 ENCOUNTER — Ambulatory Visit (INDEPENDENT_AMBULATORY_CARE_PROVIDER_SITE_OTHER): Payer: Medicare Other | Admitting: Ophthalmology

## 2019-04-13 ENCOUNTER — Encounter (INDEPENDENT_AMBULATORY_CARE_PROVIDER_SITE_OTHER): Payer: Self-pay | Admitting: Ophthalmology

## 2019-04-13 ENCOUNTER — Other Ambulatory Visit: Payer: Self-pay

## 2019-04-13 VITALS — BP 169/73

## 2019-04-13 DIAGNOSIS — H3581 Retinal edema: Secondary | ICD-10-CM

## 2019-04-13 DIAGNOSIS — Z961 Presence of intraocular lens: Secondary | ICD-10-CM

## 2019-04-13 DIAGNOSIS — I10 Essential (primary) hypertension: Secondary | ICD-10-CM

## 2019-04-13 DIAGNOSIS — H35033 Hypertensive retinopathy, bilateral: Secondary | ICD-10-CM | POA: Diagnosis not present

## 2019-04-14 ENCOUNTER — Encounter (INDEPENDENT_AMBULATORY_CARE_PROVIDER_SITE_OTHER): Payer: Self-pay | Admitting: Ophthalmology

## 2019-04-23 DIAGNOSIS — M79671 Pain in right foot: Secondary | ICD-10-CM | POA: Diagnosis not present

## 2019-04-23 DIAGNOSIS — M79672 Pain in left foot: Secondary | ICD-10-CM | POA: Diagnosis not present

## 2019-04-23 DIAGNOSIS — I739 Peripheral vascular disease, unspecified: Secondary | ICD-10-CM | POA: Diagnosis not present

## 2019-04-23 DIAGNOSIS — L600 Ingrowing nail: Secondary | ICD-10-CM | POA: Diagnosis not present

## 2019-05-04 DIAGNOSIS — M1991 Primary osteoarthritis, unspecified site: Secondary | ICD-10-CM | POA: Diagnosis not present

## 2019-05-04 DIAGNOSIS — M810 Age-related osteoporosis without current pathological fracture: Secondary | ICD-10-CM | POA: Diagnosis not present

## 2019-05-04 DIAGNOSIS — E039 Hypothyroidism, unspecified: Secondary | ICD-10-CM | POA: Diagnosis not present

## 2019-05-04 DIAGNOSIS — Z0001 Encounter for general adult medical examination with abnormal findings: Secondary | ICD-10-CM | POA: Diagnosis not present

## 2019-05-04 DIAGNOSIS — E7849 Other hyperlipidemia: Secondary | ICD-10-CM | POA: Diagnosis not present

## 2019-05-04 DIAGNOSIS — H6591 Unspecified nonsuppurative otitis media, right ear: Secondary | ICD-10-CM | POA: Diagnosis not present

## 2019-05-04 DIAGNOSIS — I10 Essential (primary) hypertension: Secondary | ICD-10-CM | POA: Diagnosis not present

## 2019-05-04 DIAGNOSIS — Z6823 Body mass index (BMI) 23.0-23.9, adult: Secondary | ICD-10-CM | POA: Diagnosis not present

## 2019-05-04 DIAGNOSIS — E782 Mixed hyperlipidemia: Secondary | ICD-10-CM | POA: Diagnosis not present

## 2019-05-04 DIAGNOSIS — Z1389 Encounter for screening for other disorder: Secondary | ICD-10-CM | POA: Diagnosis not present

## 2019-05-18 DIAGNOSIS — M79672 Pain in left foot: Secondary | ICD-10-CM | POA: Diagnosis not present

## 2019-05-18 DIAGNOSIS — L600 Ingrowing nail: Secondary | ICD-10-CM | POA: Diagnosis not present

## 2019-05-18 DIAGNOSIS — M79671 Pain in right foot: Secondary | ICD-10-CM | POA: Diagnosis not present

## 2019-05-18 DIAGNOSIS — M79675 Pain in left toe(s): Secondary | ICD-10-CM | POA: Diagnosis not present

## 2019-05-18 DIAGNOSIS — M79674 Pain in right toe(s): Secondary | ICD-10-CM | POA: Diagnosis not present

## 2019-05-21 DIAGNOSIS — M9901 Segmental and somatic dysfunction of cervical region: Secondary | ICD-10-CM | POA: Diagnosis not present

## 2019-05-21 DIAGNOSIS — M9903 Segmental and somatic dysfunction of lumbar region: Secondary | ICD-10-CM | POA: Diagnosis not present

## 2019-05-21 DIAGNOSIS — S335XXA Sprain of ligaments of lumbar spine, initial encounter: Secondary | ICD-10-CM | POA: Diagnosis not present

## 2019-05-21 DIAGNOSIS — M9902 Segmental and somatic dysfunction of thoracic region: Secondary | ICD-10-CM | POA: Diagnosis not present

## 2019-05-21 DIAGNOSIS — M4004 Postural kyphosis, thoracic region: Secondary | ICD-10-CM | POA: Diagnosis not present

## 2019-05-21 DIAGNOSIS — S134XXA Sprain of ligaments of cervical spine, initial encounter: Secondary | ICD-10-CM | POA: Diagnosis not present

## 2019-05-28 DIAGNOSIS — E039 Hypothyroidism, unspecified: Secondary | ICD-10-CM | POA: Diagnosis not present

## 2019-06-01 ENCOUNTER — Ambulatory Visit (INDEPENDENT_AMBULATORY_CARE_PROVIDER_SITE_OTHER): Payer: Medicare Other | Admitting: Otolaryngology

## 2019-06-01 DIAGNOSIS — H903 Sensorineural hearing loss, bilateral: Secondary | ICD-10-CM | POA: Diagnosis not present

## 2019-06-01 DIAGNOSIS — H9209 Otalgia, unspecified ear: Secondary | ICD-10-CM | POA: Diagnosis not present

## 2019-06-11 DIAGNOSIS — M4004 Postural kyphosis, thoracic region: Secondary | ICD-10-CM | POA: Diagnosis not present

## 2019-06-11 DIAGNOSIS — M9901 Segmental and somatic dysfunction of cervical region: Secondary | ICD-10-CM | POA: Diagnosis not present

## 2019-06-11 DIAGNOSIS — S335XXA Sprain of ligaments of lumbar spine, initial encounter: Secondary | ICD-10-CM | POA: Diagnosis not present

## 2019-06-11 DIAGNOSIS — S134XXA Sprain of ligaments of cervical spine, initial encounter: Secondary | ICD-10-CM | POA: Diagnosis not present

## 2019-06-11 DIAGNOSIS — M9903 Segmental and somatic dysfunction of lumbar region: Secondary | ICD-10-CM | POA: Diagnosis not present

## 2019-06-11 DIAGNOSIS — M9902 Segmental and somatic dysfunction of thoracic region: Secondary | ICD-10-CM | POA: Diagnosis not present

## 2019-06-12 DIAGNOSIS — Z23 Encounter for immunization: Secondary | ICD-10-CM | POA: Diagnosis not present

## 2019-06-16 DIAGNOSIS — M4004 Postural kyphosis, thoracic region: Secondary | ICD-10-CM | POA: Diagnosis not present

## 2019-06-16 DIAGNOSIS — M9902 Segmental and somatic dysfunction of thoracic region: Secondary | ICD-10-CM | POA: Diagnosis not present

## 2019-06-16 DIAGNOSIS — S134XXA Sprain of ligaments of cervical spine, initial encounter: Secondary | ICD-10-CM | POA: Diagnosis not present

## 2019-06-16 DIAGNOSIS — M9903 Segmental and somatic dysfunction of lumbar region: Secondary | ICD-10-CM | POA: Diagnosis not present

## 2019-06-16 DIAGNOSIS — S335XXA Sprain of ligaments of lumbar spine, initial encounter: Secondary | ICD-10-CM | POA: Diagnosis not present

## 2019-06-16 DIAGNOSIS — M9901 Segmental and somatic dysfunction of cervical region: Secondary | ICD-10-CM | POA: Diagnosis not present

## 2019-06-19 DIAGNOSIS — M9902 Segmental and somatic dysfunction of thoracic region: Secondary | ICD-10-CM | POA: Diagnosis not present

## 2019-06-19 DIAGNOSIS — S335XXA Sprain of ligaments of lumbar spine, initial encounter: Secondary | ICD-10-CM | POA: Diagnosis not present

## 2019-06-19 DIAGNOSIS — M9903 Segmental and somatic dysfunction of lumbar region: Secondary | ICD-10-CM | POA: Diagnosis not present

## 2019-06-19 DIAGNOSIS — M9901 Segmental and somatic dysfunction of cervical region: Secondary | ICD-10-CM | POA: Diagnosis not present

## 2019-06-19 DIAGNOSIS — M4004 Postural kyphosis, thoracic region: Secondary | ICD-10-CM | POA: Diagnosis not present

## 2019-06-19 DIAGNOSIS — S134XXA Sprain of ligaments of cervical spine, initial encounter: Secondary | ICD-10-CM | POA: Diagnosis not present

## 2019-06-23 ENCOUNTER — Emergency Department (HOSPITAL_COMMUNITY)
Admission: EM | Admit: 2019-06-23 | Discharge: 2019-06-23 | Discharge status: Absent without leave | Payer: Medicare Other

## 2019-06-24 DIAGNOSIS — S134XXA Sprain of ligaments of cervical spine, initial encounter: Secondary | ICD-10-CM | POA: Diagnosis not present

## 2019-06-24 DIAGNOSIS — M4004 Postural kyphosis, thoracic region: Secondary | ICD-10-CM | POA: Diagnosis not present

## 2019-06-24 DIAGNOSIS — M9902 Segmental and somatic dysfunction of thoracic region: Secondary | ICD-10-CM | POA: Diagnosis not present

## 2019-06-24 DIAGNOSIS — M9903 Segmental and somatic dysfunction of lumbar region: Secondary | ICD-10-CM | POA: Diagnosis not present

## 2019-06-24 DIAGNOSIS — M9901 Segmental and somatic dysfunction of cervical region: Secondary | ICD-10-CM | POA: Diagnosis not present

## 2019-06-24 DIAGNOSIS — S335XXA Sprain of ligaments of lumbar spine, initial encounter: Secondary | ICD-10-CM | POA: Diagnosis not present

## 2019-07-01 DIAGNOSIS — M9903 Segmental and somatic dysfunction of lumbar region: Secondary | ICD-10-CM | POA: Diagnosis not present

## 2019-07-01 DIAGNOSIS — S134XXA Sprain of ligaments of cervical spine, initial encounter: Secondary | ICD-10-CM | POA: Diagnosis not present

## 2019-07-01 DIAGNOSIS — M4004 Postural kyphosis, thoracic region: Secondary | ICD-10-CM | POA: Diagnosis not present

## 2019-07-01 DIAGNOSIS — M9901 Segmental and somatic dysfunction of cervical region: Secondary | ICD-10-CM | POA: Diagnosis not present

## 2019-07-01 DIAGNOSIS — M9902 Segmental and somatic dysfunction of thoracic region: Secondary | ICD-10-CM | POA: Diagnosis not present

## 2019-07-01 DIAGNOSIS — S335XXA Sprain of ligaments of lumbar spine, initial encounter: Secondary | ICD-10-CM | POA: Diagnosis not present

## 2019-07-09 DIAGNOSIS — S335XXA Sprain of ligaments of lumbar spine, initial encounter: Secondary | ICD-10-CM | POA: Diagnosis not present

## 2019-07-09 DIAGNOSIS — M4004 Postural kyphosis, thoracic region: Secondary | ICD-10-CM | POA: Diagnosis not present

## 2019-07-09 DIAGNOSIS — S134XXA Sprain of ligaments of cervical spine, initial encounter: Secondary | ICD-10-CM | POA: Diagnosis not present

## 2019-07-09 DIAGNOSIS — M9901 Segmental and somatic dysfunction of cervical region: Secondary | ICD-10-CM | POA: Diagnosis not present

## 2019-07-09 DIAGNOSIS — M9903 Segmental and somatic dysfunction of lumbar region: Secondary | ICD-10-CM | POA: Diagnosis not present

## 2019-07-09 DIAGNOSIS — M9902 Segmental and somatic dysfunction of thoracic region: Secondary | ICD-10-CM | POA: Diagnosis not present

## 2019-07-10 DIAGNOSIS — E039 Hypothyroidism, unspecified: Secondary | ICD-10-CM | POA: Diagnosis not present

## 2019-07-16 DIAGNOSIS — S335XXA Sprain of ligaments of lumbar spine, initial encounter: Secondary | ICD-10-CM | POA: Diagnosis not present

## 2019-07-16 DIAGNOSIS — M9902 Segmental and somatic dysfunction of thoracic region: Secondary | ICD-10-CM | POA: Diagnosis not present

## 2019-07-16 DIAGNOSIS — M4004 Postural kyphosis, thoracic region: Secondary | ICD-10-CM | POA: Diagnosis not present

## 2019-07-16 DIAGNOSIS — S134XXA Sprain of ligaments of cervical spine, initial encounter: Secondary | ICD-10-CM | POA: Diagnosis not present

## 2019-07-16 DIAGNOSIS — M9903 Segmental and somatic dysfunction of lumbar region: Secondary | ICD-10-CM | POA: Diagnosis not present

## 2019-07-16 DIAGNOSIS — M9901 Segmental and somatic dysfunction of cervical region: Secondary | ICD-10-CM | POA: Diagnosis not present

## 2019-07-23 DIAGNOSIS — M9901 Segmental and somatic dysfunction of cervical region: Secondary | ICD-10-CM | POA: Diagnosis not present

## 2019-07-23 DIAGNOSIS — M9902 Segmental and somatic dysfunction of thoracic region: Secondary | ICD-10-CM | POA: Diagnosis not present

## 2019-07-23 DIAGNOSIS — S335XXA Sprain of ligaments of lumbar spine, initial encounter: Secondary | ICD-10-CM | POA: Diagnosis not present

## 2019-07-23 DIAGNOSIS — S134XXA Sprain of ligaments of cervical spine, initial encounter: Secondary | ICD-10-CM | POA: Diagnosis not present

## 2019-07-23 DIAGNOSIS — M4004 Postural kyphosis, thoracic region: Secondary | ICD-10-CM | POA: Diagnosis not present

## 2019-07-23 DIAGNOSIS — M9903 Segmental and somatic dysfunction of lumbar region: Secondary | ICD-10-CM | POA: Diagnosis not present

## 2019-08-05 DIAGNOSIS — S134XXA Sprain of ligaments of cervical spine, initial encounter: Secondary | ICD-10-CM | POA: Diagnosis not present

## 2019-08-05 DIAGNOSIS — M9901 Segmental and somatic dysfunction of cervical region: Secondary | ICD-10-CM | POA: Diagnosis not present

## 2019-08-05 DIAGNOSIS — M9903 Segmental and somatic dysfunction of lumbar region: Secondary | ICD-10-CM | POA: Diagnosis not present

## 2019-08-05 DIAGNOSIS — M4004 Postural kyphosis, thoracic region: Secondary | ICD-10-CM | POA: Diagnosis not present

## 2019-08-05 DIAGNOSIS — M9902 Segmental and somatic dysfunction of thoracic region: Secondary | ICD-10-CM | POA: Diagnosis not present

## 2019-08-05 DIAGNOSIS — S335XXA Sprain of ligaments of lumbar spine, initial encounter: Secondary | ICD-10-CM | POA: Diagnosis not present

## 2019-08-07 NOTE — Progress Notes (Signed)
[facility] Note  08/14/2019     CHIEF COMPLAINT Patient presents for Retina Follow Up   HISTORY OF PRESENT ILLNESS: Kaitlyn Sanchez is a 84 y.o. female who presents to the clinic today for:   HPI    Retina Follow Up    Patient presents with  Other.  In both eyes.  Severity is mild.  Duration of 4 months.  Since onset it is stable.  I, the attending physician,  performed the HPI with the patient and updated documentation appropriately.          Comments    Retina follow up for Hypertensive retinopathy. Patient states no vision changes.        Last edited by Bernarda Caffey, MD on 08/14/2019  1:19 PM. (History)    pt states she is getting her blood pressure and thyroid under control  Referring physician: Madelin Headings, DO [address] [place],  [place] [address]  HISTORICAL INFORMATION:   Selected notes from the MEDICAL RECORD NUMBER Referred by Dr. Madelin Headings for concern of diabetic retinopathy LEE: 03.11.20 (M. 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 (Ophthalmic Drugs)   No current facility-administered medications for this visit.  (Ophthalmic Drugs)   Current Outpatient Medications (Other)  Medication Sig  . aspirin EC 81 MG tablet Take 243 mg by mouth at bedtime.  . Calcium Carbonate-Vit D-Min (CALTRATE 600+D PLUS PO) Take 1 tablet by mouth daily.  . Flaxseed, Linseed, (FLAXSEED OIL) 1000 MG CAPS Take 1,000 mg by mouth daily.   Marland Kitchen glucosamine-chondroitin 500-400 MG tablet Take 1 tablet by mouth daily.  Marland Kitchen levothyroxine (SYNTHROID) 100 MCG tablet Take 100 mcg by mouth daily before breakfast.  . LORazepam (ATIVAN) 1 MG tablet Take 0.5 mg by mouth at bedtime as needed for anxiety.  . multivitamin-iron-minerals-folic acid (CENTRUM) chewable tablet Chew 1 tablet by mouth  daily.  Marland Kitchen olmesartan (BENICAR) 20 MG tablet Take 20 mg by mouth 2 (two) times a day.   . potassium gluconate 595 MG TABS tablet Take 595 mg by mouth daily.  . vitamin B-12 (CYANOCOBALAMIN) 1000 MCG tablet Take 1,000 mcg by mouth daily.  . vitamin C (ASCORBIC ACID) 500 MG tablet Take 500 mg by mouth daily.  Marland Kitchen acidophilus (RISAQUAD) CAPS capsule Take 1 capsule by mouth daily.  . Biotin w/ Vitamins C & E (HAIR/SKIN/NAILS PO) Take 1 tablet by mouth daily.  . cloNIDine (CATAPRES) 0.1 MG tablet Take 1 tablet by mouth 2 (two) times daily as needed. If SPB > 160  . docusate sodium (COLACE) 100 MG capsule Take 300 mg by mouth at bedtime.   . Omega 3 1200 MG CAPS Take 1,200 mg by mouth 2 (two) times daily.   No current facility-administered medications for this visit.  (Other)      REVIEW OF SYSTEMS: ROS    Positive for: Endocrine, Eyes   Negative for: Constitutional, Gastrointestinal, Neurological, Skin, Genitourinary, Musculoskeletal, HENT, Cardiovascular, Respiratory, Psychiatric, Allergic/Imm, Heme/Lymph   Last edited by Elmore Guise, COT on 08/14/2019 12:55 PM. (History)       ALLERGIES No Known Allergies  PAST MEDICAL HISTORY Past Medical History:  Diagnosis Date  . Essential hypertension   . GERD (gastroesophageal reflux disease)   . History of cardiac catheterization 2013   Mild coronary atherosclerosis - [facility]  . Hyperlipidemia   .  Hypothyroidism    Past Surgical History:  Procedure Laterality Date  . APPENDECTOMY    . CARPAL TUNNEL RELEASE    . CATARACT EXTRACTION W/PHACO Left 12/28/2013   Procedure: CATARACT EXTRACTION PHACO AND INTRAOCULAR LENS PLACEMENT (IOC);  Surgeon: Tonny Branch, MD;  Location: [facility];  Service: Ophthalmology;  Laterality: Left;  CDE 18.64  . CATARACT EXTRACTION W/PHACO Right 01/07/2014   Procedure: CATARACT EXTRACTION PHACO AND INTRAOCULAR LENS PLACEMENT (IOC);  Surgeon: Tonny Branch, MD;  Location: [facility];  Service: Ophthalmology;  Laterality: Right;   CDE:11.01  . COLONOSCOPY    . ESOPHAGOGASTRODUODENOSCOPY N/A 10/11/2014   RMR:non critical schatzkis ring/HH  . LUMBAR LAMINECTOMY     X 2  . TONSILLECTOMY      FAMILY HISTORY Family History  Problem Relation Age of Onset  . Colon cancer Other        no first degree relatives  . Renal cancer Father   . Glaucoma Mother     SOCIAL HISTORY Social History   Tobacco Use  . Smoking status: Never Smoker  . Smokeless tobacco: Never Used  Substance Use Topics  . Alcohol use: No  . Drug use: No         OPHTHALMIC EXAM:  Base Eye Exam    Visual Acuity (Snellen - Linear)      Right Left   Dist cc 20/20 20/20       Tonometry (Tonopen, 1:06 PM)      Right Left   Pressure 15 15       Pupils      Dark Light Shape React APD   Right 3 2 Round Brisk None   Left 3 2 Round Brisk None       Visual Fields (Counting fingers)      Left Right    Full Full       Extraocular Movement      Right Left    Full, Ortho Full, Ortho       Neuro/Psych    Oriented x3: Yes   Mood/Affect: Normal       Dilation    Both eyes: 1.0% Mydriacyl, 2.5% Phenylephrine @ 1:06 PM        Slit Lamp and Fundus Exam    Slit Lamp Exam      Right Left   Lids/Lashes Dermatochalasis - upper lid, Ptosis Dermatochalasis - upper lid, Ptosis   Conjunctiva/Sclera mild temporal Pinguecula mild nasal temporal Pinguecula   Cornea Arcus, mild Debris in tear film, trace Punctate epithelial erosions, Well healed cataract wounds Arcus, mild Debris in tear film, 1+ Punctate epithelial erosions, Well healed cataract wounds, fine endo pigment inferiorly   Anterior Chamber Deep and quiet Deep and quiet   Iris Round and dilated Round and poorly dilated to 4.69m   Lens PC IOL in good position with open PC PC IOL in good position with open PC   Vitreous Vitreous syneresis, Posterior vitreous detachment mild Vitreous syneresis, Posterior vitreous detachment, vitreous condensations inferiorly       Fundus Exam       Right Left   Disc Pink and Sharp Pink and Sharp   C/D Ratio 0.4 0.3   Macula Blunted foveal reflex, scattered paracentral IRH/DBH - persistent, trace cystic changes Flat, Blunted foveal reflex, mild RPE mottling, trace Epiretinal membrane, rare MA superior macula, No edema   Vessels Vascular attenuation, mild tortuosity, AV crossing changes, mild Copper wiring Vascular attenuation, AV crossing changes   Periphery Attached, no heme Attached,  blot heme along IN arcade        Refraction    Wearing Rx      Sphere Cylinder Add   Right -0.25 Sphere +2.25   Left -0.25 Sphere +2.25   Type: PAL          IMAGING AND PROCEDURES  Imaging and Procedures for @TODAY @  OCT, Retina - OU - Both Eyes       Right Eye Quality was good. Central Foveal Thickness: 275. Progression has been stable. Findings include normal foveal contour, no SRF, intraretinal fluid, intraretinal hyper-reflective material (Slight increase in cystic changes).   Left Eye Quality was good. Central Foveal Thickness: 265. Progression has been stable. Findings include normal foveal contour, no IRF, no SRF.   Notes *Images captured and stored on drive  Diagnosis / Impression:  OD: NFP, no SRF, trace persistent cystic changes OS: NFP, no IRF/SRF   Clinical management:  See below  Abbreviations: NFP - Normal foveal profile. CME - cystoid macular edema. PED - pigment epithelial detachment. IRF - intraretinal fluid. SRF - subretinal fluid. EZ - ellipsoid zone. ERM - epiretinal membrane. ORA - outer retinal atrophy. ORT - outer retinal tubulation. SRHM - subretinal hyper-reflective material                 ASSESSMENT/PLAN:    ICD-10-CM   1. Hypertensive retinopathy of both eyes  H35.033   2. Essential hypertension  I10   3. Retinal edema  H35.81 OCT, Retina - OU - Both Eyes  4. Pseudophakia of both eyes  Z96.1     1-3. Hypertensive retinopathy OU  - focal intraretinal hemes in macula OU (OD > OS) --  persistent  - OCT shows trace persistent cystic changes OD  - BCVA remains 20/20 OU  - FA 3.23.20 shows focal MA in macula with late leakage OU    - BP in office measured 144/86 today (11.06.2020) -- improved from 169/73 at prior visit 07.06.2020  - meds adjusted by PCP and cardiology  - discussed importance of tight BP control  - monitor for now  - F/u 4 months DFE/OCT  4. Pseudophakia OU  - s/p CE/IOL OU - Dr. Tonny Branch (2015)  - beautiful surgeries, doing well  - monitor    Ophthalmic Meds Ordered this visit:  No orders of the defined types were placed in this encounter.      Return in about 4 months (around 12/12/2019) for f/u HTN ret OU, DFE, OCT.  There are no Patient Instructions on file for this visit.   Explained the diagnoses, plan, and follow up with the patient and they expressed understanding.  Patient expressed understanding of the importance of proper follow up care.   This document serves as a record of services personally performed by Gardiner Sleeper, MD, PhD. It was created on their behalf by Ernest Mallick, OA, an ophthalmic assistant. The creation of this record is the provider's dictation and/or activities during the visit.    Electronically signed by: Ernest Mallick, OA 10.30.2020 12:59 AM   Gardiner Sleeper, M.D., Ph.D. Diseases & Surgery of the Retina and Vitreous [facility]  I have reviewed the above documentation for accuracy and completeness, and I agree with the above. Gardiner Sleeper, M.D., Ph.D. 08/15/19 12:59 AM    Abbreviations: M myopia (nearsighted); A astigmatism; H hyperopia (farsighted); P presbyopia; Mrx spectacle prescription;  CTL contact lenses; OD right eye; OS left eye; OU both eyes  XT exotropia; ET esotropia; PEK punctate epithelial keratitis; PEE punctate epithelial erosions; DES dry eye syndrome; MGD meibomian gland dysfunction; ATs artificial tears; PFAT's preservative free artificial tears; [place] nuclear  sclerotic cataract; PSC posterior subcapsular cataract; ERM epi-retinal membrane; PVD posterior vitreous detachment; RD retinal detachment; DM diabetes mellitus; DR diabetic retinopathy; NPDR non-proliferative diabetic retinopathy; PDR proliferative diabetic retinopathy; CSME clinically significant macular edema; DME diabetic macular edema; dbh dot blot hemorrhages; CWS cotton wool spot; POAG primary open angle glaucoma; C/D cup-to-disc ratio; HVF humphrey visual field; GVF goldmann visual field; OCT optical coherence tomography; IOP intraocular pressure; BRVO Branch retinal vein occlusion; CRVO central retinal vein occlusion; CRAO central retinal artery occlusion; BRAO branch retinal artery occlusion; RT retinal tear; SB scleral buckle; PPV pars plana vitrectomy; VH Vitreous hemorrhage; PRP panretinal laser photocoagulation; IVK intravitreal kenalog; VMT vitreomacular traction; MH Macular hole;  NVD neovascularization of the disc; NVE neovascularization elsewhere; AREDS age related eye disease study; ARMD age related macular degeneration; POAG primary open angle glaucoma; EBMD epithelial/anterior basement membrane dystrophy; ACIOL anterior chamber intraocular lens; IOL intraocular lens; PCIOL posterior chamber intraocular lens; Phaco/IOL phacoemulsification with intraocular lens placement; [place] photorefractive keratectomy; LASIK laser assisted in situ keratomileusis; HTN hypertension; DM diabetes mellitus; COPD chronic obstructive pulmonary disease

## 2019-08-14 ENCOUNTER — Ambulatory Visit (INDEPENDENT_AMBULATORY_CARE_PROVIDER_SITE_OTHER): Payer: Medicare Other | Admitting: Ophthalmology

## 2019-08-14 ENCOUNTER — Encounter (INDEPENDENT_AMBULATORY_CARE_PROVIDER_SITE_OTHER): Payer: Self-pay | Admitting: Ophthalmology

## 2019-08-14 VITALS — BP 144/86 | HR 78

## 2019-08-14 DIAGNOSIS — H35033 Hypertensive retinopathy, bilateral: Secondary | ICD-10-CM

## 2019-08-14 DIAGNOSIS — H3581 Retinal edema: Secondary | ICD-10-CM | POA: Diagnosis not present

## 2019-08-14 DIAGNOSIS — I10 Essential (primary) hypertension: Secondary | ICD-10-CM | POA: Diagnosis not present

## 2019-08-14 DIAGNOSIS — Z961 Presence of intraocular lens: Secondary | ICD-10-CM

## 2019-08-26 DIAGNOSIS — S134XXA Sprain of ligaments of cervical spine, initial encounter: Secondary | ICD-10-CM | POA: Diagnosis not present

## 2019-08-26 DIAGNOSIS — M9902 Segmental and somatic dysfunction of thoracic region: Secondary | ICD-10-CM | POA: Diagnosis not present

## 2019-08-26 DIAGNOSIS — M9903 Segmental and somatic dysfunction of lumbar region: Secondary | ICD-10-CM | POA: Diagnosis not present

## 2019-08-26 DIAGNOSIS — M9901 Segmental and somatic dysfunction of cervical region: Secondary | ICD-10-CM | POA: Diagnosis not present

## 2019-08-26 DIAGNOSIS — S335XXA Sprain of ligaments of lumbar spine, initial encounter: Secondary | ICD-10-CM | POA: Diagnosis not present

## 2019-08-26 DIAGNOSIS — M4004 Postural kyphosis, thoracic region: Secondary | ICD-10-CM | POA: Diagnosis not present

## 2019-09-16 DIAGNOSIS — M4004 Postural kyphosis, thoracic region: Secondary | ICD-10-CM | POA: Diagnosis not present

## 2019-09-16 DIAGNOSIS — M9901 Segmental and somatic dysfunction of cervical region: Secondary | ICD-10-CM | POA: Diagnosis not present

## 2019-09-16 DIAGNOSIS — M9902 Segmental and somatic dysfunction of thoracic region: Secondary | ICD-10-CM | POA: Diagnosis not present

## 2019-09-16 DIAGNOSIS — S335XXA Sprain of ligaments of lumbar spine, initial encounter: Secondary | ICD-10-CM | POA: Diagnosis not present

## 2019-09-16 DIAGNOSIS — M9903 Segmental and somatic dysfunction of lumbar region: Secondary | ICD-10-CM | POA: Diagnosis not present

## 2019-09-16 DIAGNOSIS — S134XXA Sprain of ligaments of cervical spine, initial encounter: Secondary | ICD-10-CM | POA: Diagnosis not present

## 2019-09-22 DIAGNOSIS — E063 Autoimmune thyroiditis: Secondary | ICD-10-CM | POA: Diagnosis not present

## 2019-09-22 DIAGNOSIS — I10 Essential (primary) hypertension: Secondary | ICD-10-CM | POA: Diagnosis not present

## 2019-09-22 DIAGNOSIS — M1991 Primary osteoarthritis, unspecified site: Secondary | ICD-10-CM | POA: Diagnosis not present

## 2019-09-22 DIAGNOSIS — Z6823 Body mass index (BMI) 23.0-23.9, adult: Secondary | ICD-10-CM | POA: Diagnosis not present

## 2019-09-23 ENCOUNTER — Other Ambulatory Visit: Payer: Self-pay | Admitting: Internal Medicine

## 2019-09-23 ENCOUNTER — Other Ambulatory Visit (HOSPITAL_COMMUNITY): Payer: Self-pay | Admitting: Internal Medicine

## 2019-09-23 DIAGNOSIS — I10 Essential (primary) hypertension: Secondary | ICD-10-CM

## 2019-10-06 ENCOUNTER — Ambulatory Visit (HOSPITAL_COMMUNITY)
Admission: RE | Admit: 2019-10-06 | Discharge: 2019-10-06 | Disposition: A | Discharge status: Home / Self Care | Payer: Medicare Other | Source: Ambulatory Visit | Attending: Internal Medicine | Admitting: Internal Medicine

## 2019-10-06 DIAGNOSIS — I10 Essential (primary) hypertension: Secondary | ICD-10-CM | POA: Insufficient documentation

## 2019-10-06 LAB — POCT I-STAT CREATININE: Creatinine, Ser: 0.8 mg/dL (ref 0.44–1.00)

## 2019-10-06 MED ORDER — IOHEXOL 350 MG/ML SOLN
100.0000 mL | Freq: Once | INTRAVENOUS | Status: AC | PRN
  Administered 2019-10-06: 100 mL via INTRAVENOUS

## 2019-10-14 DIAGNOSIS — S233XXA Sprain of ligaments of thoracic spine, initial encounter: Secondary | ICD-10-CM | POA: Diagnosis not present

## 2019-10-14 DIAGNOSIS — M9901 Segmental and somatic dysfunction of cervical region: Secondary | ICD-10-CM | POA: Diagnosis not present

## 2019-10-14 DIAGNOSIS — M9902 Segmental and somatic dysfunction of thoracic region: Secondary | ICD-10-CM | POA: Diagnosis not present

## 2019-10-14 DIAGNOSIS — S134XXA Sprain of ligaments of cervical spine, initial encounter: Secondary | ICD-10-CM | POA: Diagnosis not present

## 2019-10-14 DIAGNOSIS — M9903 Segmental and somatic dysfunction of lumbar region: Secondary | ICD-10-CM | POA: Diagnosis not present

## 2019-11-08 DIAGNOSIS — E7849 Other hyperlipidemia: Secondary | ICD-10-CM | POA: Diagnosis not present

## 2019-11-08 DIAGNOSIS — M810 Age-related osteoporosis without current pathological fracture: Secondary | ICD-10-CM | POA: Diagnosis not present

## 2019-11-08 DIAGNOSIS — E063 Autoimmune thyroiditis: Secondary | ICD-10-CM | POA: Diagnosis not present

## 2019-11-08 DIAGNOSIS — I10 Essential (primary) hypertension: Secondary | ICD-10-CM | POA: Diagnosis not present

## 2019-11-09 DIAGNOSIS — J019 Acute sinusitis, unspecified: Secondary | ICD-10-CM | POA: Diagnosis not present

## 2019-11-19 DIAGNOSIS — S134XXA Sprain of ligaments of cervical spine, initial encounter: Secondary | ICD-10-CM | POA: Diagnosis not present

## 2019-11-19 DIAGNOSIS — M9903 Segmental and somatic dysfunction of lumbar region: Secondary | ICD-10-CM | POA: Diagnosis not present

## 2019-11-19 DIAGNOSIS — M9901 Segmental and somatic dysfunction of cervical region: Secondary | ICD-10-CM | POA: Diagnosis not present

## 2019-11-19 DIAGNOSIS — S233XXA Sprain of ligaments of thoracic spine, initial encounter: Secondary | ICD-10-CM | POA: Diagnosis not present

## 2019-11-19 DIAGNOSIS — M9902 Segmental and somatic dysfunction of thoracic region: Secondary | ICD-10-CM | POA: Diagnosis not present

## 2019-12-06 DIAGNOSIS — M810 Age-related osteoporosis without current pathological fracture: Secondary | ICD-10-CM | POA: Diagnosis not present

## 2019-12-06 DIAGNOSIS — I10 Essential (primary) hypertension: Secondary | ICD-10-CM | POA: Diagnosis not present

## 2019-12-06 DIAGNOSIS — E063 Autoimmune thyroiditis: Secondary | ICD-10-CM | POA: Diagnosis not present

## 2019-12-06 DIAGNOSIS — E7849 Other hyperlipidemia: Secondary | ICD-10-CM | POA: Diagnosis not present

## 2019-12-08 NOTE — Progress Notes (Signed)
[facility] Note  12/11/2019     CHIEF COMPLAINT Patient presents for Retina Follow Up   HISTORY OF PRESENT ILLNESS: Kaitlyn Sanchez is a 85 y.o. female who presents to the clinic today for:   HPI    Retina Follow Up    Patient presents with  Other.  In both eyes.  This started weeks ago.  Severity is moderate.  Duration of weeks.  Since onset it is stable.  I, the attending physician,  performed the HPI with the patient and updated documentation appropriately.          Comments    Pt states her vision is the same OU.  Denies eye pain or discomfort and denies any new or worsening floaters or fol OU.       Last edited by Bernarda Caffey, MD on 12/11/2019  1:43 PM. (History)    pt states she is doing well, no visual complaints, she wears OTC reading glasses as needed   Referring physician: Madelin Headings, DO [address] [place],  [place] [address]  HISTORICAL INFORMATION:   Selected notes from the MEDICAL RECORD NUMBER Referred by Dr. Madelin Headings for concern of diabetic retinopathy LEE: 03.11.20 (M. 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 (Ophthalmic Drugs)   No current facility-administered medications for this visit. (Ophthalmic Drugs)   Current Outpatient Medications (Other)  Medication Sig  . acidophilus (RISAQUAD) CAPS capsule Take 1 capsule by mouth daily.  Marland Kitchen aspirin EC 81 MG tablet Take 243 mg by mouth at bedtime.  . Biotin w/ Vitamins C & E (HAIR/SKIN/NAILS PO) Take 1 tablet by mouth daily.  . Calcium Carbonate-Vit D-Min (CALTRATE 600+D PLUS PO) Take 1 tablet by mouth daily.  . cloNIDine (CATAPRES) 0.1 MG tablet Take 1 tablet by mouth 2 (two) times daily as needed. If SPB > 160  . docusate sodium (COLACE) 100 MG capsule Take 300 mg by mouth at bedtime.   . Flaxseed,  Linseed, (FLAXSEED OIL) 1000 MG CAPS Take 1,000 mg by mouth daily.   Marland Kitchen glucosamine-chondroitin 500-400 MG tablet Take 1 tablet by mouth daily.  Marland Kitchen levothyroxine (SYNTHROID) 100 MCG tablet Take 100 mcg by mouth daily before breakfast.  . LORazepam (ATIVAN) 1 MG tablet Take 0.5 mg by mouth at bedtime as needed for anxiety.  . multivitamin-iron-minerals-folic acid (CENTRUM) chewable tablet Chew 1 tablet by mouth daily.  Marland Kitchen olmesartan (BENICAR) 20 MG tablet Take 20 mg by mouth 2 (two) times a day.   . Omega 3 1200 MG CAPS Take 1,200 mg by mouth 2 (two) times daily.  . potassium gluconate 595 MG TABS tablet Take 595 mg by mouth daily.  . vitamin B-12 (CYANOCOBALAMIN) 1000 MCG tablet Take 1,000 mcg by mouth daily.  . vitamin C (ASCORBIC ACID) 500 MG tablet Take 500 mg by mouth daily.   No current facility-administered medications for this visit. (Other)      REVIEW OF SYSTEMS: ROS    Positive for: Endocrine, Eyes   Negative for: Constitutional, Gastrointestinal, Neurological, Skin, Genitourinary, Musculoskeletal, HENT, Cardiovascular, Respiratory, Psychiatric, Allergic/Imm, Heme/Lymph   Last edited by Doneen Poisson on 12/11/2019  1:20 PM. (History)       ALLERGIES No Known Allergies  PAST MEDICAL HISTORY Past Medical History:  Diagnosis Date  . Essential hypertension   . GERD (gastroesophageal reflux disease)   .  History of cardiac catheterization 2013   Mild coronary atherosclerosis - [facility]  . Hyperlipidemia   . Hypothyroidism    Past Surgical History:  Procedure Laterality Date  . APPENDECTOMY    . CARPAL TUNNEL RELEASE    . CATARACT EXTRACTION W/PHACO Left 12/28/2013   Procedure: CATARACT EXTRACTION PHACO AND INTRAOCULAR LENS PLACEMENT (IOC);  Surgeon: Tonny Branch, MD;  Location: [facility];  Service: Ophthalmology;  Laterality: Left;  CDE 18.64  . CATARACT EXTRACTION W/PHACO Right 01/07/2014   Procedure: CATARACT EXTRACTION PHACO AND INTRAOCULAR LENS PLACEMENT (IOC);  Surgeon:  Tonny Branch, MD;  Location: [facility];  Service: Ophthalmology;  Laterality: Right;  CDE:11.01  . COLONOSCOPY    . ESOPHAGOGASTRODUODENOSCOPY N/A 10/11/2014   RMR:non critical schatzkis ring/HH  . LUMBAR LAMINECTOMY     X 2  . TONSILLECTOMY      FAMILY HISTORY Family History  Problem Relation Age of Onset  . Colon cancer Other        no first degree relatives  . Renal cancer Father   . Glaucoma Mother     SOCIAL HISTORY Social History   Tobacco Use  . Smoking status: Never Smoker  . Smokeless tobacco: Never Used  Substance Use Topics  . Alcohol use: No  . Drug use: No         OPHTHALMIC EXAM:  Base Eye Exam    Visual Acuity (Snellen - Linear)      Right Left   Dist [place] 20/20 -1 20/20 -1       Tonometry (Tonopen, 1:23 PM)      Right Left   Pressure 11 11       Pupils      Dark Light Shape React APD   Right 3 2 Round Brisk 0   Left 3 2 Round Brisk 0       Visual Fields      Left Right    Full Full       Extraocular Movement      Right Left    Full Full       Neuro/Psych    Oriented x3: Yes   Mood/Affect: Normal       Dilation    Both eyes: 1.0% Mydriacyl, 2.5% Phenylephrine @ 1:23 PM        Slit Lamp and Fundus Exam    Slit Lamp Exam      Right Left   Lids/Lashes Dermatochalasis - upper lid, Ptosis Dermatochalasis - upper lid, Ptosis   Conjunctiva/Sclera mild temporal Pinguecula mild nasal temporal Pinguecula   Cornea Arcus, mild Debris in tear film, trace Punctate epithelial erosions, Well healed cataract wounds Arcus, mild Debris in tear film, 1+ Punctate epithelial erosions, Well healed cataract wounds, fine endo pigment inferiorly   Anterior Chamber Deep and quiet Deep and quiet   Iris Round and dilated Round and poorly dilated to 4.31m   Lens PC IOL in good position with open PC PC IOL in good position with open PC   Vitreous Vitreous syneresis, Posterior vitreous detachment mild Vitreous syneresis, Posterior vitreous detachment, vitreous  condensations inferiorly       Fundus Exam      Right Left   Disc Pink and Sharp Pink and Sharp   C/D Ratio 0.3 0.3   Macula Blunted foveal reflex, scattered MA, trace cystic changes Flat, Blunted foveal reflex, mild RPE mottling, trace Epiretinal membrane, rare MA, no edema   Vessels Vascular attenuation, mild tortuosity, AV crossing changes Vascular attenuation, AV crossing  changes   Periphery Attached, no heme Attached, blot heme along IN arcade -- improving          IMAGING AND PROCEDURES  Imaging and Procedures for '@TODAY'$ @  OCT, Retina - OU - Both Eyes       Right Eye Quality was good. Central Foveal Thickness: 268. Progression has been stable. Findings include normal foveal contour, no SRF, intraretinal fluid, intraretinal hyper-reflective material (Persistent cystic changes temporal fovea).   Left Eye Quality was good. Central Foveal Thickness: 262. Progression has been stable. Findings include normal foveal contour, no IRF, no SRF.   Notes *Images captured and stored on drive  Diagnosis / Impression:  OD: NFP, no SRF, Persistent cystic changes temporal fovea OS: NFP, no IRF/SRF   Clinical management:  See below  Abbreviations: NFP - Normal foveal profile. CME - cystoid macular edema. PED - pigment epithelial detachment. IRF - intraretinal fluid. SRF - subretinal fluid. EZ - ellipsoid zone. ERM - epiretinal membrane. ORA - outer retinal atrophy. ORT - outer retinal tubulation. SRHM - subretinal hyper-reflective material                 ASSESSMENT/PLAN:    ICD-10-CM   1. Hypertensive retinopathy of both eyes  H35.033   2. Essential hypertension  I10   3. Retinal edema  H35.81 OCT, Retina - OU - Both Eyes  4. Pseudophakia of both eyes  Z96.1     1-3. Hypertensive retinopathy OU  - focal intraretinal hemes in macula OU (OD > OS) -- persistent  - OCT shows trace persistent cystic changes OD  - BCVA remains 20/20 OU  - FA 3.23.20 shows focal MA in  macula with late leakage OU    - BP in office measured 167/72 R arm and 147/67 L arm today (3.5.21); 147/67 (11.06.2020); 169/73 at prior visit 07.06.2020  - meds adjusted by PCP and cardiology  - discussed importance of tight BP control  - monitor for now  - F/u 2 months DFE/OCT/FA transit OD, consider focal laser OD  4. Pseudophakia OU  - s/p CE/IOL OU - Dr. Tonny Branch (2015)  - beautiful surgeries, doing well  - monitor    Ophthalmic Meds Ordered this visit:  No orders of the defined types were placed in this encounter.      Return in about 2 months (around 02/10/2020) for f/u 2 months, HTN ret, DFE, OCT, FA (optos, transit OD).  There are no Patient Instructions on file for this visit.   Explained the diagnoses, plan, and follow up with the patient and they expressed understanding.  Patient expressed understanding of the importance of proper follow up care.   This document serves as a record of services personally performed by Gardiner Sleeper, MD, PhD. It was created on their behalf by Ernest Mallick, OA, an ophthalmic assistant. The creation of this record is the provider's dictation and/or activities during the visit.    Electronically signed by: Ernest Mallick, OA 03.02.2021 2:02 PM   Gardiner Sleeper, M.D., Ph.D. Diseases & Surgery of the Retina and Vitreous [facility]  I have reviewed the above documentation for accuracy and completeness, and I agree with the above. Gardiner Sleeper, M.D., Ph.D. 12/11/19 2:02 PM   Abbreviations: M myopia (nearsighted); A astigmatism; H hyperopia (farsighted); P presbyopia; Mrx spectacle prescription;  CTL contact lenses; OD right eye; OS left eye; OU both eyes  XT exotropia; ET esotropia; PEK punctate epithelial keratitis; PEE punctate epithelial  erosions; DES dry eye syndrome; MGD meibomian gland dysfunction; ATs artificial tears; PFAT's preservative free artificial tears; [place] nuclear sclerotic cataract; PSC posterior  subcapsular cataract; ERM epi-retinal membrane; PVD posterior vitreous detachment; RD retinal detachment; DM diabetes mellitus; DR diabetic retinopathy; NPDR non-proliferative diabetic retinopathy; PDR proliferative diabetic retinopathy; CSME clinically significant macular edema; DME diabetic macular edema; dbh dot blot hemorrhages; CWS cotton wool spot; POAG primary open angle glaucoma; C/D cup-to-disc ratio; HVF humphrey visual field; GVF goldmann visual field; OCT optical coherence tomography; IOP intraocular pressure; BRVO Branch retinal vein occlusion; CRVO central retinal vein occlusion; CRAO central retinal artery occlusion; BRAO branch retinal artery occlusion; RT retinal tear; SB scleral buckle; PPV pars plana vitrectomy; VH Vitreous hemorrhage; PRP panretinal laser photocoagulation; IVK intravitreal kenalog; VMT vitreomacular traction; MH Macular hole;  NVD neovascularization of the disc; NVE neovascularization elsewhere; AREDS age related eye disease study; ARMD age related macular degeneration; POAG primary open angle glaucoma; EBMD epithelial/anterior basement membrane dystrophy; ACIOL anterior chamber intraocular lens; IOL intraocular lens; PCIOL posterior chamber intraocular lens; Phaco/IOL phacoemulsification with intraocular lens placement; [place] photorefractive keratectomy; LASIK laser assisted in situ keratomileusis; HTN hypertension; DM diabetes mellitus; COPD chronic obstructive pulmonary disease

## 2019-12-11 ENCOUNTER — Encounter (INDEPENDENT_AMBULATORY_CARE_PROVIDER_SITE_OTHER): Payer: Self-pay | Admitting: Ophthalmology

## 2019-12-11 ENCOUNTER — Other Ambulatory Visit: Payer: Self-pay

## 2019-12-11 ENCOUNTER — Ambulatory Visit (INDEPENDENT_AMBULATORY_CARE_PROVIDER_SITE_OTHER): Payer: Medicare Other | Admitting: Ophthalmology

## 2019-12-11 VITALS — BP 147/67 | HR 72

## 2019-12-11 DIAGNOSIS — I10 Essential (primary) hypertension: Secondary | ICD-10-CM | POA: Diagnosis not present

## 2019-12-11 DIAGNOSIS — Z961 Presence of intraocular lens: Secondary | ICD-10-CM | POA: Diagnosis not present

## 2019-12-11 DIAGNOSIS — H35033 Hypertensive retinopathy, bilateral: Secondary | ICD-10-CM

## 2019-12-11 DIAGNOSIS — H3581 Retinal edema: Secondary | ICD-10-CM | POA: Diagnosis not present

## 2019-12-17 DIAGNOSIS — S134XXA Sprain of ligaments of cervical spine, initial encounter: Secondary | ICD-10-CM | POA: Diagnosis not present

## 2019-12-17 DIAGNOSIS — M9903 Segmental and somatic dysfunction of lumbar region: Secondary | ICD-10-CM | POA: Diagnosis not present

## 2019-12-17 DIAGNOSIS — M9901 Segmental and somatic dysfunction of cervical region: Secondary | ICD-10-CM | POA: Diagnosis not present

## 2019-12-17 DIAGNOSIS — S233XXA Sprain of ligaments of thoracic spine, initial encounter: Secondary | ICD-10-CM | POA: Diagnosis not present

## 2019-12-17 DIAGNOSIS — M9902 Segmental and somatic dysfunction of thoracic region: Secondary | ICD-10-CM | POA: Diagnosis not present

## 2020-01-01 DIAGNOSIS — Z1389 Encounter for screening for other disorder: Secondary | ICD-10-CM | POA: Diagnosis not present

## 2020-01-01 DIAGNOSIS — Z6823 Body mass index (BMI) 23.0-23.9, adult: Secondary | ICD-10-CM | POA: Diagnosis not present

## 2020-01-06 DIAGNOSIS — E063 Autoimmune thyroiditis: Secondary | ICD-10-CM | POA: Diagnosis not present

## 2020-01-06 DIAGNOSIS — M810 Age-related osteoporosis without current pathological fracture: Secondary | ICD-10-CM | POA: Diagnosis not present

## 2020-01-06 DIAGNOSIS — E7849 Other hyperlipidemia: Secondary | ICD-10-CM | POA: Diagnosis not present

## 2020-01-06 DIAGNOSIS — I10 Essential (primary) hypertension: Secondary | ICD-10-CM | POA: Diagnosis not present

## 2020-01-14 DIAGNOSIS — M9901 Segmental and somatic dysfunction of cervical region: Secondary | ICD-10-CM | POA: Diagnosis not present

## 2020-01-14 DIAGNOSIS — S134XXA Sprain of ligaments of cervical spine, initial encounter: Secondary | ICD-10-CM | POA: Diagnosis not present

## 2020-01-14 DIAGNOSIS — M9902 Segmental and somatic dysfunction of thoracic region: Secondary | ICD-10-CM | POA: Diagnosis not present

## 2020-01-14 DIAGNOSIS — M9903 Segmental and somatic dysfunction of lumbar region: Secondary | ICD-10-CM | POA: Diagnosis not present

## 2020-01-14 DIAGNOSIS — S233XXA Sprain of ligaments of thoracic spine, initial encounter: Secondary | ICD-10-CM | POA: Diagnosis not present

## 2020-02-05 DIAGNOSIS — I10 Essential (primary) hypertension: Secondary | ICD-10-CM | POA: Diagnosis not present

## 2020-02-05 DIAGNOSIS — E7849 Other hyperlipidemia: Secondary | ICD-10-CM | POA: Diagnosis not present

## 2020-02-05 DIAGNOSIS — M810 Age-related osteoporosis without current pathological fracture: Secondary | ICD-10-CM | POA: Diagnosis not present

## 2020-02-05 DIAGNOSIS — E063 Autoimmune thyroiditis: Secondary | ICD-10-CM | POA: Diagnosis not present

## 2020-02-09 DIAGNOSIS — Z6823 Body mass index (BMI) 23.0-23.9, adult: Secondary | ICD-10-CM | POA: Diagnosis not present

## 2020-02-09 DIAGNOSIS — E039 Hypothyroidism, unspecified: Secondary | ICD-10-CM | POA: Diagnosis not present

## 2020-02-09 DIAGNOSIS — Z1389 Encounter for screening for other disorder: Secondary | ICD-10-CM | POA: Diagnosis not present

## 2020-02-09 DIAGNOSIS — I10 Essential (primary) hypertension: Secondary | ICD-10-CM | POA: Diagnosis not present

## 2020-02-09 DIAGNOSIS — Z0000 Encounter for general adult medical examination without abnormal findings: Secondary | ICD-10-CM | POA: Diagnosis not present

## 2020-02-09 NOTE — Progress Notes (Signed)
[facility] Note  02/10/2020     CHIEF COMPLAINT Patient presents for Retina Follow Up   HISTORY OF PRESENT ILLNESS: Kaitlyn Sanchez is a 85 y.o. female who presents to the clinic today for:   HPI    Retina Follow Up    Patient presents with  Other.  In both eyes.  This started 2 months ago.  Severity is moderate.  I, the attending physician,  performed the HPI with the patient and updated documentation appropriately.          Comments    Patient here for 2 month retina follow up for HTN RET OU. Patient states vision doing ok. May need more light at night when reading. No eye pain.        Last edited by Bernarda Caffey, MD on 02/10/2020  2:12 PM. (History)    pt states her vision is stable, she is still able to read and do the things she wants to do, she states she saw her PCP yesterday and her BP was good, she is on 5 blood pressure medications   Referring physician: [name], [address] [place],  [place] [address]  HISTORICAL INFORMATION:   Selected notes from the [place] Referred by Dr. Madelin Headings for concern of diabetic retinopathy LEE: 03.11.20 Jerilynn Mages. 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 (Ophthalmic Drugs)   No current facility-administered medications for this visit. (Ophthalmic Drugs)   Current Outpatient Medications (Other)  Medication Sig  . acidophilus (RISAQUAD) CAPS capsule Take 1 capsule by mouth daily.  Marland Kitchen aspirin EC 81 MG tablet Take 243 mg by mouth at bedtime.  . Biotin w/ Vitamins C & E (HAIR/SKIN/NAILS PO) Take 1 tablet by mouth daily.  . Calcium Carbonate-Vit D-Min (CALTRATE 600+D PLUS PO) Take 1 tablet by mouth daily.  . cloNIDine (CATAPRES) 0.1 MG tablet Take 1 tablet by mouth 2 (two) times daily as needed. If SPB > 160  .  docusate sodium (COLACE) 100 MG capsule Take 300 mg by mouth at bedtime.   . Flaxseed, Linseed, (FLAXSEED OIL) 1000 MG CAPS Take 1,000 mg by mouth daily.   Marland Kitchen glucosamine-chondroitin 500-400 MG tablet Take 1 tablet by mouth daily.  Marland Kitchen levothyroxine (SYNTHROID) 100 MCG tablet Take 100 mcg by mouth daily before breakfast.  . LORazepam (ATIVAN) 1 MG tablet Take 0.5 mg by mouth at bedtime as needed for anxiety.  . multivitamin-iron-minerals-folic acid (CENTRUM) chewable tablet Chew 1 tablet by mouth daily.  Marland Kitchen olmesartan (BENICAR) 20 MG tablet Take 20 mg by mouth 2 (two) times a day.   . Omega 3 1200 MG CAPS Take 1,200 mg by mouth 2 (two) times daily.  . potassium gluconate 595 MG TABS tablet Take 595 mg by mouth daily.  . vitamin B-12 (CYANOCOBALAMIN) 1000 MCG tablet Take 1,000 mcg by mouth daily.  . vitamin C (ASCORBIC ACID) 500 MG tablet Take 500 mg by mouth daily.   No current facility-administered medications for this visit. (Other)      REVIEW OF SYSTEMS: ROS    Positive for: Endocrine, Eyes   Negative for: Constitutional, Gastrointestinal, Neurological, Skin, Genitourinary, Musculoskeletal, HENT, Cardiovascular, Respiratory, Psychiatric, Allergic/Imm, Heme/Lymph   Last edited by Theodore Demark, COA on 02/10/2020  1:27 PM. (History)       ALLERGIES No Known Allergies  PAST MEDICAL HISTORY Past Medical History:  Diagnosis Date  . Essential hypertension   . GERD (gastroesophageal reflux disease)   . History of cardiac catheterization 2013   Mild coronary atherosclerosis - [facility]  . Hyperlipidemia   . Hypothyroidism    Past Surgical History:  Procedure Laterality Date  . APPENDECTOMY    . CARPAL TUNNEL RELEASE    . CATARACT EXTRACTION W/PHACO Left 12/28/2013   Procedure: CATARACT EXTRACTION PHACO AND INTRAOCULAR LENS PLACEMENT (IOC);  Surgeon: Tonny Branch, MD;  Location: [facility];  Service: Ophthalmology;  Laterality: Left;  CDE 18.64  . CATARACT EXTRACTION W/PHACO Right  01/07/2014   Procedure: CATARACT EXTRACTION PHACO AND INTRAOCULAR LENS PLACEMENT (IOC);  Surgeon: Tonny Branch, MD;  Location: [facility];  Service: Ophthalmology;  Laterality: Right;  CDE:11.01  . COLONOSCOPY    . ESOPHAGOGASTRODUODENOSCOPY N/A 10/11/2014   RMR:non critical schatzkis ring/HH  . LUMBAR LAMINECTOMY     X 2  . TONSILLECTOMY      FAMILY HISTORY Family History  Problem Relation Age of Onset  . Colon cancer Other        no first degree relatives  . Renal cancer Father   . Glaucoma Mother     SOCIAL HISTORY Social History   Tobacco Use  . Smoking status: Never Smoker  . Smokeless tobacco: Never Used  Substance Use Topics  . Alcohol use: No  . Drug use: No         OPHTHALMIC EXAM:  Base Eye Exam    Visual Acuity (Snellen - Linear)      Right Left   Dist [place] 20/20 -1 20/20 -1   Correction: Glasses       Tonometry (Tonopen, 1:23 PM)      Right Left   Pressure 14 15       Pupils      Dark Light Shape React APD   Right 3 2 Round Brisk None   Left 3 2 Round Brisk None       Visual Fields (Counting fingers)      Left Right    Full Full       Extraocular Movement      Right Left    Full, Ortho Full, Ortho       Neuro/Psych    Oriented x3: Yes   Mood/Affect: Normal       Dilation    Both eyes: 1.0% Mydriacyl, 2.5% Phenylephrine @ 1:23 PM        Slit Lamp and Fundus Exam    Slit Lamp Exam      Right Left   Lids/Lashes Dermatochalasis - upper lid, Ptosis Dermatochalasis - upper lid, Ptosis   Conjunctiva/Sclera mild temporal Pinguecula mild nasal temporal Pinguecula   Cornea Arcus, trace Punctate epithelial erosions, Well healed cataract wounds Arcus, 1+ Punctate epithelial erosions, Well healed cataract wounds, fine endo pigment inferiorly   Anterior Chamber Deep and quiet Deep and quiet   Iris Round and dilated Round and moderately dilated to 4.80m   Lens PC IOL in good position with open PC PC IOL in good position with open PC   Vitreous  Vitreous syneresis, Posterior vitreous detachment mild Vitreous syneresis, Posterior vitreous detachment, vitreous condensations inferiorly       Fundus Exam      Right Left   Disc Pink and Sharp Pink and Sharp   C/D Ratio 0.3 0.3   Macula Blunted foveal reflex, persistent, focal MA temporal to fovea, trace cystic changes -- improved Flat,  Blunted foveal reflex, mild RPE mottling, trace Epiretinal membrane, rare MA temporal macula, no edema   Vessels Vascular attenuation, mild tortuosity Vascular attenuation, mild tortuosity   Periphery Attached, no heme Attached, no heme          IMAGING AND PROCEDURES  Imaging and Procedures for _0 @  OCT, Retina - OU - Both Eyes       Right Eye Quality was good. Central Foveal Thickness: 269. Progression has been stable. Findings include normal foveal contour, no SRF, intraretinal fluid, intraretinal hyper-reflective material (Mild interval improvement in cystic changes temporal fovea).   Left Eye Quality was good. Central Foveal Thickness: 264. Progression has been stable. Findings include normal foveal contour, no IRF, no SRF.   Notes *Images captured and stored on drive  Diagnosis / Impression:  OD: NFP, no SRF,Mild interval improvement in cystic changes temporal fovea  OS: NFP, no IRF/SRF   Clinical management:  See below  Abbreviations: NFP - Normal foveal profile. CME - cystoid macular edema. PED - pigment epithelial detachment. IRF - intraretinal fluid. SRF - subretinal fluid. EZ - ellipsoid zone. ERM - epiretinal membrane. ORA - outer retinal atrophy. ORT - outer retinal tubulation. SRHM - subretinal hyper-reflective material        Fluorescein Angiography Optos (Transit OD)       Right Eye   Progression has been stable. Early phase findings include microaneurysm (Focal MA). Mid/Late phase findings include microaneurysm, leakage.   Left Eye   Progression has been stable. Early phase findings include microaneurysm  (Focal MA). Mid/Late phase findings include leakage, microaneurysm.   Notes Images stored on drive;   Impression: Focal MA with leakage OU (OD>OS) -- stable from prior                   ASSESSMENT/PLAN:    ICD-10-CM   1. Hypertensive retinopathy of both eyes  H35.033 Fluorescein Angiography Optos (Transit OD)  2. Essential hypertension  I10   3. Retinal edema  H35.81 OCT, Retina - OU - Both Eyes  4. Pseudophakia of both eyes  Z96.1     1-3. Hypertensive retinopathy OU  - focal intraretinal hemes in macula OU (OD > OS) -- persistent  - OCT shows trace persistent cystic changes OD  - BCVA remains 20/20 OU  - FA (05.05.21) shows focal MA in macula with late leakage OU    - BP in office measured 167/72 R arm and 147/67 L arm (03.05.21); 147/67 (11.06.2020); 169/73 at prior visit 07.06.2020  - meds adjusted by PCP and cardiology  - discussed importance of tight BP control  - monitor for now  - F/u 6 months DFE/OCT  4. Pseudophakia OU  - s/p CE/IOL OU - Dr. Tonny Branch (2015)  - beautiful surgeries, doing well  - monitor    Ophthalmic Meds Ordered this visit:  No orders of the defined types were placed in this encounter.      Return in about 6 months (around 08/12/2020) for f/u 6 months, HTN ret OU, DFE, OCT.  There are no Patient Instructions on file for this visit.   Explained the diagnoses, plan, and follow up with the patient and they expressed understanding.  Patient expressed understanding of the importance of proper follow up care.   This document serves as a record of services personally performed by Gardiner Sleeper, MD, PhD. It was created on their behalf by Ernest Mallick, OA, an ophthalmic assistant. The creation of this record is the provider's dictation  and/or activities during the visit.    Electronically signed by: Ernest Mallick, OA 05.05.2021 1:39 AM  Gardiner Sleeper, M.D., Ph.D. Diseases & Surgery of the Retina and [place] 02/10/2020   I have reviewed the above documentation for accuracy and completeness, and I agree with the above. Gardiner Sleeper, M.D., Ph.D. 02/13/20 1:39 AM   Abbreviations: M myopia (nearsighted); A astigmatism; H hyperopia (farsighted); P presbyopia; Mrx spectacle prescription;  CTL contact lenses; OD right eye; OS left eye; OU both eyes  XT exotropia; ET esotropia; PEK punctate epithelial keratitis; PEE punctate epithelial erosions; DES dry eye syndrome; MGD meibomian gland dysfunction; ATs artificial tears; PFAT's preservative free artificial tears; [place] nuclear sclerotic cataract; PSC posterior subcapsular cataract; ERM epi-retinal membrane; PVD posterior vitreous detachment; RD retinal detachment; DM diabetes mellitus; DR diabetic retinopathy; NPDR non-proliferative diabetic retinopathy; PDR proliferative diabetic retinopathy; CSME clinically significant macular edema; DME diabetic macular edema; dbh dot blot hemorrhages; CWS cotton wool spot; POAG primary open angle glaucoma; C/D cup-to-disc ratio; HVF humphrey visual field; GVF goldmann visual field; OCT optical coherence tomography; IOP intraocular pressure; BRVO Branch retinal vein occlusion; CRVO central retinal vein occlusion; CRAO central retinal artery occlusion; BRAO branch retinal artery occlusion; RT retinal tear; SB scleral buckle; PPV pars plana vitrectomy; VH Vitreous hemorrhage; PRP panretinal laser photocoagulation; IVK intravitreal kenalog; VMT vitreomacular traction; MH Macular hole;  NVD neovascularization of the disc; NVE neovascularization elsewhere; AREDS age related eye disease study; ARMD age related macular degeneration; POAG primary open angle glaucoma; EBMD epithelial/anterior basement membrane dystrophy; ACIOL anterior chamber intraocular lens; IOL intraocular lens; PCIOL posterior chamber intraocular lens; Phaco/IOL phacoemulsification with intraocular lens placement; [place] photorefractive keratectomy;  LASIK laser assisted in situ keratomileusis; HTN hypertension; DM diabetes mellitus; COPD chronic obstructive pulmonary disease

## 2020-02-10 ENCOUNTER — Encounter (INDEPENDENT_AMBULATORY_CARE_PROVIDER_SITE_OTHER): Payer: Self-pay | Admitting: Ophthalmology

## 2020-02-10 ENCOUNTER — Other Ambulatory Visit: Payer: Self-pay

## 2020-02-10 ENCOUNTER — Ambulatory Visit (INDEPENDENT_AMBULATORY_CARE_PROVIDER_SITE_OTHER): Payer: Medicare Other | Admitting: Ophthalmology

## 2020-02-10 DIAGNOSIS — H3581 Retinal edema: Secondary | ICD-10-CM | POA: Diagnosis not present

## 2020-02-10 DIAGNOSIS — I10 Essential (primary) hypertension: Secondary | ICD-10-CM | POA: Diagnosis not present

## 2020-02-10 DIAGNOSIS — Z961 Presence of intraocular lens: Secondary | ICD-10-CM

## 2020-02-10 DIAGNOSIS — H35033 Hypertensive retinopathy, bilateral: Secondary | ICD-10-CM | POA: Diagnosis not present

## 2020-02-11 DIAGNOSIS — M9902 Segmental and somatic dysfunction of thoracic region: Secondary | ICD-10-CM | POA: Diagnosis not present

## 2020-02-11 DIAGNOSIS — M9901 Segmental and somatic dysfunction of cervical region: Secondary | ICD-10-CM | POA: Diagnosis not present

## 2020-02-11 DIAGNOSIS — M9903 Segmental and somatic dysfunction of lumbar region: Secondary | ICD-10-CM | POA: Diagnosis not present

## 2020-02-11 DIAGNOSIS — S233XXA Sprain of ligaments of thoracic spine, initial encounter: Secondary | ICD-10-CM | POA: Diagnosis not present

## 2020-02-11 DIAGNOSIS — S134XXA Sprain of ligaments of cervical spine, initial encounter: Secondary | ICD-10-CM | POA: Diagnosis not present

## 2020-03-09 ENCOUNTER — Other Ambulatory Visit (HOSPITAL_COMMUNITY): Payer: Self-pay | Admitting: Family Medicine

## 2020-03-09 DIAGNOSIS — Z1231 Encounter for screening mammogram for malignant neoplasm of breast: Secondary | ICD-10-CM

## 2020-03-10 DIAGNOSIS — S233XXA Sprain of ligaments of thoracic spine, initial encounter: Secondary | ICD-10-CM | POA: Diagnosis not present

## 2020-03-10 DIAGNOSIS — M9902 Segmental and somatic dysfunction of thoracic region: Secondary | ICD-10-CM | POA: Diagnosis not present

## 2020-03-10 DIAGNOSIS — M9903 Segmental and somatic dysfunction of lumbar region: Secondary | ICD-10-CM | POA: Diagnosis not present

## 2020-03-10 DIAGNOSIS — S134XXA Sprain of ligaments of cervical spine, initial encounter: Secondary | ICD-10-CM | POA: Diagnosis not present

## 2020-03-10 DIAGNOSIS — M9901 Segmental and somatic dysfunction of cervical region: Secondary | ICD-10-CM | POA: Diagnosis not present

## 2020-03-17 ENCOUNTER — Inpatient Hospital Stay (HOSPITAL_COMMUNITY)
Admission: EM | Admit: 2020-03-17 | Discharge: 2020-03-19 | DRG: 641 | Disposition: A | Discharge status: Home / Self Care | Payer: Medicare Other | Attending: Internal Medicine | Admitting: Internal Medicine

## 2020-03-17 ENCOUNTER — Encounter (HOSPITAL_COMMUNITY): Payer: Self-pay | Admitting: Emergency Medicine

## 2020-03-17 ENCOUNTER — Other Ambulatory Visit: Payer: Self-pay

## 2020-03-17 ENCOUNTER — Emergency Department (HOSPITAL_COMMUNITY): Payer: Medicare Other

## 2020-03-17 DIAGNOSIS — E876 Hypokalemia: Secondary | ICD-10-CM | POA: Diagnosis not present

## 2020-03-17 DIAGNOSIS — Z79899 Other long term (current) drug therapy: Secondary | ICD-10-CM | POA: Diagnosis not present

## 2020-03-17 DIAGNOSIS — E871 Hypo-osmolality and hyponatremia: Secondary | ICD-10-CM | POA: Diagnosis present

## 2020-03-17 DIAGNOSIS — Z20822 Contact with and (suspected) exposure to covid-19: Secondary | ICD-10-CM | POA: Diagnosis not present

## 2020-03-17 DIAGNOSIS — Z7982 Long term (current) use of aspirin: Secondary | ICD-10-CM

## 2020-03-17 DIAGNOSIS — I10 Essential (primary) hypertension: Secondary | ICD-10-CM | POA: Diagnosis not present

## 2020-03-17 DIAGNOSIS — E039 Hypothyroidism, unspecified: Secondary | ICD-10-CM | POA: Diagnosis present

## 2020-03-17 DIAGNOSIS — Z83511 Family history of glaucoma: Secondary | ICD-10-CM

## 2020-03-17 DIAGNOSIS — E785 Hyperlipidemia, unspecified: Secondary | ICD-10-CM | POA: Diagnosis present

## 2020-03-17 DIAGNOSIS — T368X5A Adverse effect of other systemic antibiotics, initial encounter: Secondary | ICD-10-CM | POA: Diagnosis present

## 2020-03-17 DIAGNOSIS — R531 Weakness: Secondary | ICD-10-CM | POA: Diagnosis not present

## 2020-03-17 DIAGNOSIS — Z8051 Family history of malignant neoplasm of kidney: Secondary | ICD-10-CM

## 2020-03-17 DIAGNOSIS — Z800 Family history of malignant neoplasm of digestive organs: Secondary | ICD-10-CM

## 2020-03-17 DIAGNOSIS — Z79890 Hormone replacement therapy (postmenopausal): Secondary | ICD-10-CM

## 2020-03-17 DIAGNOSIS — I2510 Atherosclerotic heart disease of native coronary artery without angina pectoris: Secondary | ICD-10-CM | POA: Diagnosis present

## 2020-03-17 DIAGNOSIS — Z9841 Cataract extraction status, right eye: Secondary | ICD-10-CM | POA: Diagnosis not present

## 2020-03-17 DIAGNOSIS — Y92009 Unspecified place in unspecified non-institutional (private) residence as the place of occurrence of the external cause: Secondary | ICD-10-CM | POA: Diagnosis not present

## 2020-03-17 DIAGNOSIS — K219 Gastro-esophageal reflux disease without esophagitis: Secondary | ICD-10-CM | POA: Diagnosis not present

## 2020-03-17 DIAGNOSIS — Z9842 Cataract extraction status, left eye: Secondary | ICD-10-CM | POA: Diagnosis not present

## 2020-03-17 DIAGNOSIS — K5900 Constipation, unspecified: Secondary | ICD-10-CM | POA: Diagnosis not present

## 2020-03-17 DIAGNOSIS — J439 Emphysema, unspecified: Secondary | ICD-10-CM | POA: Diagnosis not present

## 2020-03-17 DIAGNOSIS — R569 Unspecified convulsions: Secondary | ICD-10-CM | POA: Diagnosis not present

## 2020-03-17 LAB — COMPREHENSIVE METABOLIC PANEL
ALT: 23 U/L (ref 0–44)
AST: 22 U/L (ref 15–41)
Albumin: 4.1 g/dL (ref 3.5–5.0)
Alkaline Phosphatase: 76 U/L (ref 38–126)
Anion gap: 10 (ref 5–15)
BUN: 12 mg/dL (ref 8–23)
CO2: 22 mmol/L (ref 22–32)
Calcium: 8.5 mg/dL — ABNORMAL LOW (ref 8.9–10.3)
Chloride: 87 mmol/L — ABNORMAL LOW (ref 98–111)
Creatinine, Ser: 0.5 mg/dL (ref 0.44–1.00)
GFR calc Af Amer: >60 mL/min (ref >60)
GFR calc non Af Amer: >60 mL/min (ref >60)
Glucose, Bld: 145 mg/dL — ABNORMAL HIGH (ref 70–99)
Potassium: 3.7 mmol/L (ref 3.5–5.1)
Sodium: 119 mmol/L — CL (ref 135–145)
Total Bilirubin: 0.5 mg/dL (ref 0.3–1.2)
Total Protein: 6.7 g/dL (ref 6.5–8.1)

## 2020-03-17 LAB — CBC WITH DIFFERENTIAL/PLATELET
Abs Immature Granulocytes: 0.02 10*3/uL (ref 0.00–0.07)
Basophils Absolute: 0 10*3/uL (ref 0.0–0.1)
Basophils Relative: 0 %
Eosinophils Absolute: 0 10*3/uL (ref 0.0–0.5)
Eosinophils Relative: 0 %
HCT: 35.4 % — ABNORMAL LOW (ref 36.0–46.0)
Hemoglobin: 12.3 g/dL (ref 12.0–15.0)
Immature Granulocytes: 0 %
Lymphocytes Relative: 14 %
Lymphs Abs: 0.7 10*3/uL (ref 0.7–4.0)
MCH: 32.8 pg (ref 26.0–34.0)
MCHC: 34.7 g/dL (ref 30.0–36.0)
MCV: 94.4 fL (ref 80.0–100.0)
Monocytes Absolute: 0.3 10*3/uL (ref 0.1–1.0)
Monocytes Relative: 6 %
Neutro Abs: 3.8 10*3/uL (ref 1.7–7.7)
Neutrophils Relative %: 80 %
Platelets: 217 10*3/uL (ref 150–400)
RBC: 3.75 MIL/uL — ABNORMAL LOW (ref 3.87–5.11)
RDW: 12.6 % (ref 11.5–15.5)
WBC: 4.8 10*3/uL (ref 4.0–10.5)
nRBC: 0 % (ref 0.0–0.2)

## 2020-03-17 LAB — URINALYSIS, ROUTINE W REFLEX MICROSCOPIC
Bilirubin Urine: NEGATIVE
Glucose, UA: 150 mg/dL — AB
Hgb urine dipstick: NEGATIVE
Ketones, ur: 80 mg/dL — AB
Leukocytes,Ua: NEGATIVE
Nitrite: NEGATIVE
Protein, ur: NEGATIVE mg/dL
Specific Gravity, Urine: 1.012 (ref 1.005–1.030)
pH: 7 (ref 5.0–8.0)

## 2020-03-17 LAB — SARS CORONAVIRUS 2 BY RT PCR (HOSPITAL ORDER, PERFORMED IN ~~LOC~~ HOSPITAL LAB): SARS Coronavirus 2: NEGATIVE

## 2020-03-17 MED ORDER — LEVETIRACETAM IN NACL 1000 MG/100ML IV SOLN
1000.0000 mg | Freq: Once | INTRAVENOUS | Status: AC
  Administered 2020-03-17: 1000 mg via INTRAVENOUS
  Filled 2020-03-17: qty 100

## 2020-03-17 MED ORDER — ONDANSETRON HCL 4 MG PO TABS: 4.0000 mg | ORAL_TABLET | Freq: Four times a day (QID) | ORAL | Status: DC | PRN

## 2020-03-17 MED ORDER — SODIUM CHLORIDE 1 G PO TABS
2.0000 g | ORAL_TABLET | Freq: Once | ORAL | Status: DC
  Filled 2020-03-17: qty 2

## 2020-03-17 MED ORDER — LORAZEPAM 2 MG/ML IJ SOLN
INTRAMUSCULAR | Status: AC
  Administered 2020-03-17: 1 mg via INTRAVENOUS
  Filled 2020-03-17: qty 1

## 2020-03-17 MED ORDER — SODIUM CHLORIDE 3 % IV SOLN
INTRAVENOUS | Status: AC
  Filled 2020-03-17 (×7): qty 500

## 2020-03-17 MED ORDER — SIMETHICONE 80 MG PO CHEW
160.0000 mg | CHEWABLE_TABLET | Freq: Once | ORAL | Status: AC
  Administered 2020-03-17: 160 mg via ORAL
  Filled 2020-03-17: qty 2

## 2020-03-17 MED ORDER — PANTOPRAZOLE SODIUM 40 MG PO TBEC
40.0000 mg | DELAYED_RELEASE_TABLET | Freq: Every day | ORAL | Status: DC
  Administered 2020-03-18 – 2020-03-19 (×2): 40 mg via ORAL
  Filled 2020-03-17 (×2): qty 1

## 2020-03-17 MED ORDER — ACETAMINOPHEN 325 MG PO TABS: 650.0000 mg | ORAL_TABLET | Freq: Four times a day (QID) | ORAL | Status: DC | PRN

## 2020-03-17 MED ORDER — ACETAMINOPHEN 650 MG RE SUPP: 650.0000 mg | Freq: Four times a day (QID) | RECTAL | Status: DC | PRN

## 2020-03-17 MED ORDER — POTASSIUM CHLORIDE IN NACL 20-0.9 MEQ/L-% IV SOLN
INTRAVENOUS | Status: DC
  Filled 2020-03-17: qty 1000

## 2020-03-17 MED ORDER — ONDANSETRON HCL 4 MG/2ML IJ SOLN: 4.0000 mg | Freq: Four times a day (QID) | INTRAMUSCULAR | Status: DC | PRN

## 2020-03-17 MED ORDER — LORAZEPAM 2 MG/ML IJ SOLN: 2.0000 mg | Freq: Once | INTRAMUSCULAR | Status: AC

## 2020-03-17 MED ORDER — ENOXAPARIN SODIUM 30 MG/0.3ML ~~LOC~~ SOLN
30.0000 mg | SUBCUTANEOUS | Status: DC
  Administered 2020-03-18: 30 mg via SUBCUTANEOUS
  Filled 2020-03-17: qty 0.3

## 2020-03-17 MED ORDER — SODIUM CHLORIDE 0.9 % IV BOLUS
1000.0000 mL | Freq: Once | INTRAVENOUS | Status: AC
  Administered 2020-03-17: 1000 mL via INTRAVENOUS

## 2020-03-17 MED ORDER — LABETALOL HCL 200 MG PO TABS
100.0000 mg | ORAL_TABLET | Freq: Once | ORAL | Status: AC
  Administered 2020-03-17: 100 mg via ORAL
  Filled 2020-03-17: qty 1

## 2020-03-17 NOTE — ED Provider Notes (Signed)
[facility] Provider Note   CSN: [id number] Arrival date & time: 03/17/20  1603     History Chief Complaint  Patient presents with  . Pneumonia    Kaitlyn Sanchez is a 85 y.o. female.  HPI      Kaitlyn Sanchez is a 85 y.o. female with past medical history for essential hypertension, GERD, and hypothyroidism who presents to the Emergency Department complaining of generalized weakness and decreased p.o. intake.  Patient's daughter states that she had nasal congestion, rhinorrhea and cold-like symptoms that began earlier in the week.  Her symptoms seem to improve on Tuesday, but worsened yesterday.  She was seen at a urgent care center in [facility] yesterday and treated for presumptive pneumonia and started on Levaquin.  She has taken 2 doses of the medication.  She comes to the emergency department for continued weakness.  She continues to have a slight cough.  She also endorses some loose stools, but took a stool softener yesterday.  She denies fever, chills, chest pain, shortness of breath, vomiting and dysuria.    Past Medical History:  Diagnosis Date  . Essential hypertension   . GERD (gastroesophageal reflux disease)   . History of cardiac catheterization 2013   Mild coronary atherosclerosis - [facility]  . Hyperlipidemia   . Hypothyroidism     Patient Active Problem List   Diagnosis Date Noted  . Hyponatremia 03/17/2020  . Constipation 08/19/2015  . Abdominal pain 08/19/2015  . Mucosal abnormality of esophagus   . Dyspepsia 10/07/2014  . H N P-LUMBAR 09/01/2008  . LOW BACK PAIN 01/28/2008  . SCIATICA 01/28/2008    Past Surgical History:  Procedure Laterality Date  . APPENDECTOMY    . CARPAL TUNNEL RELEASE    . CATARACT EXTRACTION W/PHACO Left 12/28/2013   Procedure: CATARACT EXTRACTION PHACO AND INTRAOCULAR LENS PLACEMENT (IOC);  Surgeon: Tonny Branch, MD;  Location: [facility];  Service: Ophthalmology;  Laterality: Left;  CDE 18.64  . CATARACT  EXTRACTION W/PHACO Right 01/07/2014   Procedure: CATARACT EXTRACTION PHACO AND INTRAOCULAR LENS PLACEMENT (IOC);  Surgeon: Tonny Branch, MD;  Location: [facility];  Service: Ophthalmology;  Laterality: Right;  CDE:11.01  . COLONOSCOPY    . ESOPHAGOGASTRODUODENOSCOPY N/A 10/11/2014   RMR:non critical schatzkis ring/HH  . LUMBAR LAMINECTOMY     X 2  . TONSILLECTOMY       OB History    Gravida      Para      Term      Preterm      AB      Living  3     SAB      TAB      Ectopic      Multiple      Live Births              Family History  Problem Relation Age of Onset  . Colon cancer Other        no first degree relatives  . Renal cancer Father   . Glaucoma Mother     Social History   Tobacco Use  . Smoking status: Never Smoker  . Smokeless tobacco: Never Used  Vaping Use  . Vaping Use: Never used  Substance Use Topics  . Alcohol use: No  . Drug use: No    Home Medications Prior to Admission medications   Medication Sig Start Date End Date Taking? Authorizing Provider  amLODipine (NORVASC) 10 MG tablet Take 10 mg by  mouth every evening.  03/14/20  Yes [provider]  aspirin EC 81 MG tablet Take 243 mg by mouth at bedtime.   Yes [provider]  Calcium Carbonate-Vit D-Min (CALTRATE 600+D PLUS PO) Take 1 tablet by mouth daily.   Yes [provider]  Chelated Potassium 99 MG TABS Take 1 tablet by mouth daily.   Yes [provider]  docusate sodium (COLACE) 100 MG capsule Take 300 mg by mouth at bedtime.    Yes [provider]  Flaxseed, Linseed, (FLAXSEED OIL) 1000 MG CAPS Take 1,000 mg by mouth daily.    Yes [provider]  glucosamine-chondroitin 500-400 MG tablet Take 1 tablet by mouth daily.   Yes [provider]  labetalol (NORMODYNE) 100 MG tablet Take 1 tablet by mouth 3 (three) times daily. 02/22/20  Yes [provider]  levothyroxine (SYNTHROID) 112 MCG tablet Take 112 mcg by mouth  daily. 10/28/19  Yes [provider]  LORazepam (ATIVAN) 1 MG tablet Take 0.5 mg by mouth at bedtime as needed for anxiety.   Yes [provider]  multivitamin-iron-minerals-folic acid (CENTRUM) chewable tablet Chew 1 tablet by mouth daily.   Yes [provider]  olmesartan (BENICAR) 40 MG tablet Take 40 mg by mouth daily. 02/08/20  Yes [provider]  Omega 3 1200 MG CAPS Take 1,200 mg by mouth 2 (two) times daily.   Yes [provider]  vitamin B-12 (CYANOCOBALAMIN) 1000 MCG tablet Take 1,000 mcg by mouth daily.   Yes [provider]  vitamin C (ASCORBIC ACID) 500 MG tablet Take 500 mg by mouth daily.   Yes [provider]  zinc gluconate 50 MG tablet Take 50 mg by mouth daily.   Yes [provider]  Biotin w/ Vitamins C & E (HAIR/SKIN/NAILS PO) Take 1 tablet by mouth daily. Patient not taking: Reported on 03/17/2020    [provider]    Allergies    Patient has no known allergies.  Review of Systems   Review of Systems  Constitutional: Negative for appetite change, chills, fatigue and fever.  HENT: Negative for sore throat.   Respiratory: Positive for cough. Negative for shortness of breath and wheezing.   Cardiovascular: Negative for chest pain and leg swelling.  Gastrointestinal: Negative for abdominal pain, nausea and vomiting.  Genitourinary: Negative for dysuria and flank pain.  Musculoskeletal: Negative for arthralgias, back pain, myalgias, neck pain and neck stiffness.  Skin: Negative for rash.  Neurological: Positive for weakness (Generalized weakness). Negative for dizziness and numbness.  Hematological: Does not bruise/bleed easily.    Physical Exam Updated Vital Signs BP (!) 180/61   Pulse 61   Temp 98.5 F (36.9 C) (Oral)   Resp 15   Ht 5\' 2"  (1.575 m)   Wt 59 kg   SpO2 96%   BMI 23.78 kg/m   Physical Exam Vitals and nursing note reviewed.  Constitutional:      General: She is  not in acute distress.    Appearance: Normal appearance. She is not ill-appearing.  HENT:     Mouth/Throat:     Mouth: Mucous membranes are moist.     Pharynx: No oropharyngeal exudate or posterior oropharyngeal erythema.  Eyes:     Extraocular Movements: Extraocular movements intact.     Pupils: Pupils are equal, round, and reactive to light.  Cardiovascular:     Rate and Rhythm: Normal rate and regular rhythm.     Pulses: Normal pulses.  Pulmonary:  Effort: Pulmonary effort is normal.     Breath sounds: Normal breath sounds.  Chest:     Chest wall: No tenderness.  Abdominal:     General: There is no distension.     Palpations: Abdomen is soft.     Tenderness: There is no abdominal tenderness.  Musculoskeletal:     Right lower leg: No edema.     Left lower leg: No edema.  Skin:    General: Skin is warm.     Capillary Refill: Capillary refill takes less than 2 seconds.     Findings: No rash.  Neurological:     General: No focal deficit present.     Mental Status: She is alert.     Sensory: Sensation is intact. No sensory deficit.     Motor: Motor function is intact. No weakness.     Comments: CN II-XII intact.  Speech clear.  Grips strong and symmetrical.  No pronator drift.      ED Results / Procedures / Treatments   Labs (all labs ordered are listed, but only abnormal results are displayed) Labs Reviewed  CBC WITH DIFFERENTIAL/PLATELET - Abnormal; Notable for the following components:      Result Value   RBC 3.75 (*)    HCT 35.4 (*)    All other components within normal limits  COMPREHENSIVE METABOLIC PANEL - Abnormal; Notable for the following components:   Sodium 119 (*)    Chloride 87 (*)    Glucose, Bld 145 (*)    Calcium 8.5 (*)    All other components within normal limits  URINALYSIS, ROUTINE W REFLEX MICROSCOPIC - Abnormal; Notable for the following components:   Glucose, UA 150 (*)    Ketones, ur 80 (*)    All other components within normal limits   SARS CORONAVIRUS 2 BY RT PCR (HOSPITAL ORDER, [facility])  LEGIONELLA PNEUMOPHILA SEROGP 1 UR AG  BASIC METABOLIC PANEL  OSMOLALITY  SODIUM, URINE, RANDOM  OSMOLALITY, URINE    EKG EKG Interpretation  Date/Time:  Thursday March 17 2020 20:29:28 EDT Ventricular Rate:  64 PR Interval:    QRS Duration: 103 QT Interval:  444 QTC Calculation: 459 R Axis:   61 Text Interpretation: Sinus rhythm Confirmed by Fredia Sorrow [phone]) on 03/17/2020 8:33:17 PM   Radiology DG Chest 2 View  Result Date: 03/17/2020 CLINICAL DATA:  Pneumonia, cough, generalized weakness EXAM: CHEST - 2 VIEW COMPARISON:  03/02/2011 FINDINGS: Frontal and lateral views of the chest demonstrate an unremarkable cardiac silhouette. No airspace disease, effusion, or pneumothorax. Upper lobe predominant emphysema again noted. No acute bony abnormalities. IMPRESSION: 1. Stable emphysema, no acute process. Electronically Signed   By: Randa Ngo M.D.   On: 03/17/2020 17:33    Procedures Procedures (including critical care time)  Medications Ordered in ED Medications  0.9 % NaCl with KCl 20 mEq/ L  infusion ( Intravenous New Bag/Given 03/17/20 2244)  enoxaparin (LOVENOX) injection 30 mg (has no administration in time range)  acetaminophen (TYLENOL) tablet 650 mg (has no administration in time range)    Or  acetaminophen (TYLENOL) suppository 650 mg (has no administration in time range)  ondansetron (ZOFRAN) tablet 4 mg (has no administration in time range)    Or  ondansetron (ZOFRAN) injection 4 mg (has no administration in time range)  sodium chloride tablet 2 g (2 g Oral Not Given 03/17/20 2301)  levETIRAcetam (KEPPRA) IVPB 1000 mg/100 mL premix (1,000 mg Intravenous New Bag/Given 03/17/20 2300)  sodium chloride 0.9 % bolus 1,000 mL (0 mLs Intravenous Stopped 03/17/20 2156)  labetalol (NORMODYNE) tablet 100 mg (100 mg Oral Given 03/17/20 2048)  simethicone (MYLICON) chewable tablet 160 mg  (160 mg Oral Given 03/17/20 2115)  LORazepam (ATIVAN) injection 2 mg (1 mg Intravenous Given 03/17/20 2256)    ED Course  I have reviewed the triage vital signs and the nursing notes.  Pertinent labs & imaging results that were available during my care of the patient were reviewed by me and considered in my medical decision making (see chart for details).    MDM Rules/Calculators/A&P                          Patient here for generalized weakness.  URI type symptoms earlier this week and she was seen at a local urgent care yesterday and started on Levaquin for presumptive pneumonia.  Chest x-ray today does not show evidence of pneumonia and she has no leukocytosis.  Electrolytes show significant hyponatremia.  Potassium, BUN, and creatinine are unremarkable.  No known diuretics.  No acute EKG changes.  Patient's daughter provides additional history and states that she has had hyponatremia and hypokalemia in the past. Patient will need admission for her hyponatremia, no indication for CHF.  IV normal saline ordered.  Will consult hospitalist  Consulted hospitalist, Dr. Olevia Bowens who agrees to admit.  Dr. Rogene Houston and myself for called into the room for seizure activity.  Active seizure witnessed by Dr. Rogene Houston.  Patient was placed on a nonrebreather and did not require any other intervention.  Seizure activity stopped spontaneously and patient gradually becoming more arousable.  Hospitalist was paged and at bedside  Final Clinical Impression(s) / ED Diagnoses Final diagnoses:  Hyponatremia    Rx / DC Orders ED Discharge Orders    None       Kem Parkinson, PA-C 03/17/20 2324    Fredia Sorrow, MD 03/17/20 4825    Fredia Sorrow, MD 03/18/20 1722

## 2020-03-17 NOTE — ED Triage Notes (Signed)
PT states she was diagnosed with pneumonia yesterday at [facility] in [place], [place] and was started on an antibiotic. PT states generalized weakness, decreased po intake and was told to come to the ED if symptoms worsen.

## 2020-03-17 NOTE — H&P (Signed)
History and Physical    Kaitlyn Sanchez:[id number] DOB: 03/15/1935 DOA: 03/17/2020  PCP: [name], MD   Patient coming from: Home.  I have personally briefly reviewed patient's old medical records in [facility]  Chief Complaint: Weakness.  HPI: Kaitlyn Sanchez is a 85 y.o. female with medical history significant of essential hypertension, pure, mild coronary atherosclerosis, hyperlipidemia, hypothyroidism who is brought by her daughter to the emergency department due to persistent generalized weakness associated with cough, sinus and no other URI symptoms.  She went to [facility] in the ambulance was prescribed Levaquin yesterday, however she continues to have worsening symptoms today.  Her daughter states that 4 years ago she had an episode of weakness similar to this episode, was admitted at [facility] after she was found found to be hypokalemic and hyponatremic.  Her appetite has been decreased, although she has been trying to eat 3 times a day.  ED Course: Initial vital signs temperature 98.5 F, pulse 63, respiration 18, blood pressure 190/75 mmHg and O2 sat 96% on room air.  She was given a 1000 mL NS bolus in the ER.  She had a tonic-clonic seizure.  I added lorazepam 1-2 mg IVP and levetiracetam 1000 mg IVPB loading dose.  Urinalysis shows glucosuria of 150 and ketonuria of 80 mg/dL.  The rest of the urinalysis values were unremarkable.  SARS coronavirus 2 was negative.  CBC showed a white count of 4.9 with 80% neutrophils, 14% lymphocytes 8% monocytes.  Hemoglobin was 12.3 g/dL and platelets 217.  Sodium 119, potassium 3.7, chloride 87 and CO2 22 mmol/L.  Renal and hepatic function were normal.  Glucose 145 and calcium 8.5 mg/dL.  Review of Systems: As per HPI otherwise all other systems reviewed and are negative.  Past Medical History:  Diagnosis Date  . Essential hypertension   . GERD (gastroesophageal reflux disease)   . History of cardiac catheterization 2013    Mild coronary atherosclerosis - [facility]  . Hyperlipidemia   . Hypothyroidism    Past Surgical History:  Procedure Laterality Date  . APPENDECTOMY    . CARPAL TUNNEL RELEASE    . CATARACT EXTRACTION W/PHACO Left 12/28/2013   Procedure: CATARACT EXTRACTION PHACO AND INTRAOCULAR LENS PLACEMENT (IOC);  Surgeon: Tonny Branch, MD;  Location: [facility];  Service: Ophthalmology;  Laterality: Left;  CDE 18.64  . CATARACT EXTRACTION W/PHACO Right 01/07/2014   Procedure: CATARACT EXTRACTION PHACO AND INTRAOCULAR LENS PLACEMENT (IOC);  Surgeon: Tonny Branch, MD;  Location: [facility];  Service: Ophthalmology;  Laterality: Right;  CDE:11.01  . COLONOSCOPY    . ESOPHAGOGASTRODUODENOSCOPY N/A 10/11/2014   RMR:non critical schatzkis ring/HH  . LUMBAR LAMINECTOMY     X 2  . TONSILLECTOMY     Social History  reports that she has never smoked. She has never used smokeless tobacco. She reports that she does not drink alcohol and does not use drugs.  No Known Allergies  Family History  Problem Relation Age of Onset  . Colon cancer Other        no first degree relatives  . Renal cancer Father   . Glaucoma Mother    Prior to Admission medications   Medication Sig Start Date End Date Taking? Authorizing Provider  amLODipine (NORVASC) 10 MG tablet Take 10 mg by mouth every evening.  03/14/20  Yes [provider]  aspirin EC 81 MG tablet Take 243 mg by mouth at bedtime.   Yes [provider]  Calcium Carbonate-Vit D-Min (  CALTRATE 600+D PLUS PO) Take 1 tablet by mouth daily.   Yes [provider]  Chelated Potassium 99 MG TABS Take 1 tablet by mouth daily.   Yes [provider]  docusate sodium (COLACE) 100 MG capsule Take 300 mg by mouth at bedtime.    Yes [provider]  Flaxseed, Linseed, (FLAXSEED OIL) 1000 MG CAPS Take 1,000 mg by mouth daily.    Yes [provider]  glucosamine-chondroitin 500-400 MG tablet Take 1 tablet by mouth daily.   Yes [provider]  labetalol (NORMODYNE) 100 MG tablet Take 1 tablet by mouth 3 (three) times daily. 02/22/20  Yes [provider]  levothyroxine (SYNTHROID) 112 MCG tablet Take 112 mcg by mouth daily. 10/28/19  Yes [provider]  LORazepam (ATIVAN) 1 MG tablet Take 0.5 mg by mouth at bedtime as needed for anxiety.   Yes [provider]  multivitamin-iron-minerals-folic acid (CENTRUM) chewable tablet Chew 1 tablet by mouth daily.   Yes [provider]  olmesartan (BENICAR) 40 MG tablet Take 40 mg by mouth daily. 02/08/20  Yes [provider]  Omega 3 1200 MG CAPS Take 1,200 mg by mouth 2 (two) times daily.   Yes [provider]  vitamin B-12 (CYANOCOBALAMIN) 1000 MCG tablet Take 1,000 mcg by mouth daily.   Yes [provider]  vitamin C (ASCORBIC ACID) 500 MG tablet Take 500 mg by mouth daily.   Yes [provider]  zinc gluconate 50 MG tablet Take 50 mg by mouth daily.   Yes [provider]  Biotin w/ Vitamins C & E (HAIR/SKIN/NAILS PO) Take 1 tablet by mouth daily. Patient not taking: Reported on 03/17/2020    [provider]   Physical Exam: Vitals:   03/17/20 2100 03/17/20 2147 03/17/20 2200 03/17/20 2230  BP: (!) 174/83 (!) 168/72 (!) 160/123 (!) 180/61  Pulse: 70 70 77 61  Resp: (!) 21 17 20 15   Temp:      TempSrc:      SpO2: 100% 96% 94% 96%  Weight:      Height:       Constitutional: NAD, calm, comfortable Eyes: PERRL, lids and conjunctivae normal ENMT: Mucous membranes are moist. Posterior pharynx clear of any exudate or lesions.  Neck: normal, supple, no masses, no thyromegaly Respiratory: Decreased breath sounds in bases, otherwise clear to auscultation bilaterally, no wheezing, no crackles. Normal respiratory effort. No accessory muscle use.  Cardiovascular: Regular rate and rhythm, no murmurs / rubs / gallops. No extremity edema. 2+ pedal pulses. No carotid bruits.  Abdomen: Nondistended.   BS positive.  Soft, no tenderness, no masses palpated. No hepatosplenomegaly. Musculoskeletal: no clubbing / cyanosis. Good ROM, no contractures.  Relaxed muscle tone.  Skin: no rashes, lesions, ulcers on very limited dermatological examination. Neurologic: CN 2-12 grossly intact.  Postictal/medicated with antiepileptics. Psychiatric: Postictal.   Labs on Admission: I have personally reviewed following labs and imaging studies  CBC: Recent Labs  Lab 03/17/20 1717  WBC 4.8  NEUTROABS 3.8  HGB 12.3  HCT 35.4*  MCV 94.4  PLT 409   Basic Metabolic Panel: Recent Labs  Lab 03/17/20 1717  NA 119*  K 3.7  CL 87*  CO2 22  GLUCOSE 145*  BUN 12  CREATININE 0.50  CALCIUM 8.5*   GFR: Estimated Creatinine Clearance: 42.1 mL/min (by C-G formula based on SCr of 0.5 mg/dL).  Liver Function Tests: Recent Labs  Lab 03/17/20 1717  AST 22  ALT 23  ALKPHOS 76  BILITOT 0.5  PROT 6.7  ALBUMIN 4.1    Urine analysis:    Component Value Date/Time   COLORURINE YELLOW 03/17/2020 2009   APPEARANCEUR CLEAR 03/17/2020 2009   LABSPEC 1.012 03/17/2020 2009   PHURINE 7.0 03/17/2020 2009   GLUCOSEU 150 (A) 03/17/2020 2009   HGBUR NEGATIVE 03/17/2020 2009   BILIRUBINUR NEGATIVE 03/17/2020 2009   KETONESUR 80 (A) 03/17/2020 2009   PROTEINUR NEGATIVE 03/17/2020 2009   NITRITE NEGATIVE 03/17/2020 2009   LEUKOCYTESUR NEGATIVE 03/17/2020 2009   Radiological Exams on Admission: DG Chest 2 View  Result Date: 03/17/2020 CLINICAL DATA:  Pneumonia, cough, generalized weakness EXAM: CHEST - 2 VIEW COMPARISON:  03/02/2011 FINDINGS: Frontal and lateral views of the chest demonstrate an unremarkable cardiac silhouette. No airspace disease, effusion, or pneumothorax. Upper lobe predominant emphysema again noted. No acute bony abnormalities. IMPRESSION: 1. Stable emphysema, no acute process. Electronically Signed   By: Randa Ngo M.D.   On: 03/17/2020 17:33   EKG: Independently reviewed Vent. rate  64 BPM PR interval * ms QRS duration 103 ms QT/QTc 444/459 ms P-R-T axes 47 61 71 Sinus rhythm  Assessment/Plan Principal Problem:   Hyponatremia Admit to ICU/inpatient. Hold sodium chloride tablets. Unable to swallow at this time (postictal). Begin sodium chloride 3% 40 mL/h. Monitor sodium level every 2 hours. Adjust hypertonic saline infusion as needed.  Active Problems:   Seizure ([place]) Seizure precautions. Neuro checks. Correct hyponatremia. Keppra 1000 mg IVPB x1. Lorazepam 1 mg IVPB every 4 hours PRN seizure. Neurology evaluation if it recurs.     Hypocalcemia Check phosphorus and magnesium. Recheck calcium level in a.m. Further work-up if hypocalcemia is confirmed.    Constipation Continue Colace at bedtime.    Essential hypertension Continue amlodipine 10 mg p.o. daily. Continue labetalol 100 mg p.o. 3 times daily. Continue all macitentan or formulary equivalent. Monitor BP, HR, renal function electrolytes.    GERD (gastroesophageal reflux disease) Pantoprazole 40 mg p.o. daily.    Hypothyroidism Continue Synthroid 112 mcg p.o. daily.    Hyperlipidemia Continue omega-3 200 mg capsules twice a day.   DVT prophylaxis: SCDs. Code Status:   Full code. Family Communication:  Spoke with her daughter Wells Guiles who provided history. Disposition Plan:   Patient is from:  Home.  Anticipated DC to:  Home.  Anticipated DC date:  03/19/2020.  Anticipated DC barriers: Clinical and lab values improvement. Consults called: Admission status:  Inpatient/ICU.   Severity of Illness: Very high.   Reubin Milan MD [facility]  How to contact the [facility] Attending or Consulting provider [place] or covering provider during after hours [place], for this patient?   1. Check the care team in [facility] and look for a) attending/consulting [facility] provider listed and b) the [facility] team listed 2. Log into [email or web address] and use [place]'s universal password to access. If you  do not have the password, please contact the hospital operator. 3. Locate the [facility] provider you are looking for under [facility] and page to a number that you can be directly reached. 4. If you still have difficulty reaching the provider, please page the [facility] (Director on Call) for the Hospitalists listed on amion for assistance.  03/17/2020, 11:20 PM   This document was prepared using Dragon voice recognition software and may contain some unintended transcription errors.

## 2020-03-17 NOTE — ED Notes (Signed)
Date and time results received: 03/17/20 1840 (use smartphrase ".now" to insert current time)  Test: Na Critical Value: 119  Name of Provider Notified: Rogene Houston MD  Orders Received? Or Actions Taken?: n/a

## 2020-03-18 ENCOUNTER — Encounter (HOSPITAL_COMMUNITY): Payer: Self-pay | Admitting: Internal Medicine

## 2020-03-18 LAB — BASIC METABOLIC PANEL
Anion gap: 9 (ref 5–15)
BUN: 9 mg/dL (ref 8–23)
CO2: 22 mmol/L (ref 22–32)
Calcium: 8.1 mg/dL — ABNORMAL LOW (ref 8.9–10.3)
Chloride: 95 mmol/L — ABNORMAL LOW (ref 98–111)
Creatinine, Ser: 0.49 mg/dL (ref 0.44–1.00)
GFR calc Af Amer: >60 mL/min (ref >60)
GFR calc non Af Amer: >60 mL/min (ref >60)
Glucose, Bld: 99 mg/dL (ref 70–99)
Potassium: 3.1 mmol/L — ABNORMAL LOW (ref 3.5–5.1)
Sodium: 126 mmol/L — ABNORMAL LOW (ref 135–145)

## 2020-03-18 LAB — OSMOLALITY, URINE: Osmolality, Ur: 415 mOsm/kg (ref 300–900)

## 2020-03-18 LAB — RENAL FUNCTION PANEL
Albumin: 3.6 g/dL (ref 3.5–5.0)
Albumin: 3.5 g/dL (ref 3.5–5.0)
Anion gap: 9 (ref 5–15)
Anion gap: 6 (ref 5–15)
BUN: 11 mg/dL (ref 8–23)
BUN: 11 mg/dL (ref 8–23)
CO2: 22 mmol/L (ref 22–32)
CO2: 24 mmol/L (ref 22–32)
Calcium: 8.2 mg/dL — ABNORMAL LOW (ref 8.9–10.3)
Calcium: 7.7 mg/dL — ABNORMAL LOW (ref 8.9–10.3)
Chloride: 100 mmol/L (ref 98–111)
Chloride: 102 mmol/L (ref 98–111)
Creatinine, Ser: 0.55 mg/dL (ref 0.44–1.00)
Creatinine, Ser: 0.51 mg/dL (ref 0.44–1.00)
GFR calc Af Amer: >60 mL/min (ref >60)
GFR calc Af Amer: >60 mL/min (ref >60)
GFR calc non Af Amer: >60 mL/min (ref >60)
GFR calc non Af Amer: >60 mL/min (ref >60)
Glucose, Bld: 133 mg/dL — ABNORMAL HIGH (ref 70–99)
Glucose, Bld: 91 mg/dL (ref 70–99)
Phosphorus: 1.7 mg/dL — ABNORMAL LOW (ref 2.5–4.6)
Phosphorus: 1.7 mg/dL — ABNORMAL LOW (ref 2.5–4.6)
Potassium: 3.5 mmol/L (ref 3.5–5.1)
Potassium: 4.3 mmol/L (ref 3.5–5.1)
Sodium: 131 mmol/L — ABNORMAL LOW (ref 135–145)
Sodium: 132 mmol/L — ABNORMAL LOW (ref 135–145)

## 2020-03-18 LAB — MRSA PCR SCREENING: MRSA by PCR: NEGATIVE

## 2020-03-18 LAB — SODIUM
Sodium: 120 mmol/L — ABNORMAL LOW (ref 135–145)
Sodium: 119 mmol/L — CL (ref 135–145)

## 2020-03-18 LAB — PHOSPHORUS: Phosphorus: 2.6 mg/dL (ref 2.5–4.6)

## 2020-03-18 LAB — MAGNESIUM: Magnesium: 1.5 mg/dL — ABNORMAL LOW (ref 1.7–2.4)

## 2020-03-18 LAB — SODIUM, URINE, RANDOM: Sodium, Ur: 69 mmol/L

## 2020-03-18 LAB — OSMOLALITY: Osmolality: 256 mOsm/kg — ABNORMAL LOW (ref 275–295)

## 2020-03-18 LAB — TSH: TSH: 0.764 u[IU]/mL (ref 0.350–4.500)

## 2020-03-18 MED ORDER — CHLORHEXIDINE GLUCONATE CLOTH 2 % EX PADS
6.0000 | MEDICATED_PAD | Freq: Every day | CUTANEOUS | Status: DC
  Administered 2020-03-18: 6 via TOPICAL

## 2020-03-18 MED ORDER — DOCUSATE SODIUM 100 MG PO CAPS
300.0000 mg | ORAL_CAPSULE | Freq: Every day | ORAL | Status: DC
  Filled 2020-03-18: qty 3

## 2020-03-18 MED ORDER — AMLODIPINE BESYLATE 5 MG PO TABS
10.0000 mg | ORAL_TABLET | Freq: Every evening | ORAL | Status: DC
  Administered 2020-03-18: 10 mg via ORAL
  Filled 2020-03-18: qty 2

## 2020-03-18 MED ORDER — LABETALOL HCL 200 MG PO TABS
100.0000 mg | ORAL_TABLET | Freq: Three times a day (TID) | ORAL | Status: DC
  Administered 2020-03-18 – 2020-03-19 (×5): 100 mg via ORAL
  Filled 2020-03-18 (×5): qty 1

## 2020-03-18 MED ORDER — POTASSIUM CHLORIDE CRYS ER 20 MEQ PO TBCR
40.0000 meq | EXTENDED_RELEASE_TABLET | Freq: Once | ORAL | Status: AC
  Administered 2020-03-18: 40 meq via ORAL
  Filled 2020-03-18: qty 2

## 2020-03-18 MED ORDER — LEVOTHYROXINE SODIUM 112 MCG PO TABS
112.0000 ug | ORAL_TABLET | Freq: Every day | ORAL | Status: DC
  Administered 2020-03-18 – 2020-03-19 (×2): 112 ug via ORAL
  Filled 2020-03-18 (×2): qty 1

## 2020-03-18 MED ORDER — LORAZEPAM 0.5 MG PO TABS: 0.5000 mg | ORAL_TABLET | Freq: Every evening | ORAL | Status: DC | PRN

## 2020-03-18 MED ORDER — ASPIRIN EC 81 MG PO TBEC
243.0000 mg | DELAYED_RELEASE_TABLET | Freq: Every day | ORAL | Status: DC
  Administered 2020-03-18: 243 mg via ORAL
  Filled 2020-03-18: qty 3

## 2020-03-18 MED ORDER — IRBESARTAN 150 MG PO TABS
300.0000 mg | ORAL_TABLET | Freq: Every day | ORAL | Status: DC
  Administered 2020-03-18 – 2020-03-19 (×2): 300 mg via ORAL
  Filled 2020-03-18 (×2): qty 2

## 2020-03-18 MED ORDER — SODIUM CHLORIDE 0.9 % IV SOLN: INTRAVENOUS | Status: DC

## 2020-03-18 MED ORDER — MAGNESIUM SULFATE 4 GM/100ML IV SOLN
4.0000 g | Freq: Once | INTRAVENOUS | Status: AC
  Administered 2020-03-18: 4 g via INTRAVENOUS
  Filled 2020-03-18: qty 100

## 2020-03-18 MED ORDER — CALCIUM CARBONATE ANTACID 500 MG PO CHEW
400.0000 mg | CHEWABLE_TABLET | Freq: Once | ORAL | Status: AC
  Administered 2020-03-18: 400 mg via ORAL
  Filled 2020-03-18: qty 2

## 2020-03-18 MED ORDER — POTASSIUM CHLORIDE CRYS ER 20 MEQ PO TBCR
40.0000 meq | EXTENDED_RELEASE_TABLET | ORAL | Status: AC
  Administered 2020-03-18 (×2): 40 meq via ORAL
  Filled 2020-03-18 (×2): qty 2

## 2020-03-18 MED ORDER — ENOXAPARIN SODIUM 40 MG/0.4ML ~~LOC~~ SOLN
40.0000 mg | SUBCUTANEOUS | Status: DC
  Administered 2020-03-19: 40 mg via SUBCUTANEOUS
  Filled 2020-03-18: qty 0.4

## 2020-03-18 NOTE — Progress Notes (Signed)
Patient Demographics:    Kaitlyn Sanchez, is a 85 y.o. female, DOB - [birth year], UKG:[id number]  Admit date - 03/17/2020   Admitting Physician Reubin Milan, MD  Outpatient Primary MD for the patient is [name], MD  LOS - 1   Chief Complaint  Patient presents with   Pneumonia        Subjective:    Kaitlyn Sanchez today has no fevers, no emesis,  No chest pain,   -Mild nausea,   Assessment  & Plan :    Principal Problem:   Hyponatremia--very symptomatic with seizures Active Problems:   Seizure--in the setting of low sodium and Levaquin use   Constipation   Essential hypertension   GERD (gastroesophageal reflux disease)   Hypothyroidism   Hyperlipidemia   Hypocalcemia   Brief Summary:-  85 y.o. female with medical history significant of HLD, hypothyroidism and HTN admitted on 03/17/2020 after presenting with generalized weakness and URI symptoms for several days PTA, had tonic-clonic seizures while in the ED on 03/17/2020.  Was found to have a sodium of 119 and was started on 3% hypertonic saline   A/p 1)Symptomatic Hyponatremia with Seizures-sodium is up to 126 from 119, -Admitted with a sodium of 119, denies emesis or diarrhea, denies diuretic use, denies excessive free water intake prior to admission -Some nausea and fatigue persist -Okay to transition from 3% saline solution to normal saline solution -Continue to monitor sodium levels and adjust IV fluid rate and consistently accordingly -Patient has had similar episodes of severe hyponatremia , hypocalcemia and hypokalemia previously while at [facility] in [place] --A.m. cortisol level ordered and pending, -Outpatient follow-up post discharge with endocrinologist Dr. Loni Beckwith advised due to Recurrent episodes of hypokalemia, hypocalcemia hypomagnesemia and  hyponatremia and elevated BP -Avoid excessive free  water -Work-up revealed urine osmolarity of 415, serum osmolarity of 256, urine sodium is 69 -Replace and recheck potassium and magnesium  2)Seizures-- The combination of Levaquin/levofloxacin antibiotic and low sodium of 119 probably triggered the seizures -Will not pursue further seizure work-up at this time -No need for antiepileptics either -Patient was prescribed Levaquin for URI symptoms couple days prior to admission she took at least 2 doses---her seizure was over 60 hours after her first dose of Levaquin  3)HTN-stage II--continue amlodipine 10 mg daily, labetalol 100 mg 3 times daily, and AVapro 300 mg daily  4) hypothyroidism--TSH is 0.764, continue levothyroxine at 112 mcg daily  5)GERD--continue Protonix  Disposition/Need for in-Hospital Stay- patient unable to be discharged at this time due to --seizures due to hyponatremia requiring 3% normal saline  Status is: Inpatient  Remains inpatient appropriate because:IV treatments appropriate due to intensity of illness or inability to take PO and Inpatient level of care appropriate due to severity of illness   Disposition: The patient is from: Home              Anticipated d/c is to: Home              Anticipated d/c date is: 1 day              Patient currently is not medically stable to d/c. Barriers: Not Clinically Stable- --seizures due to hyponatremia requiring 3% normal saline  Code Status :  full code  Family Communication:    NA (patient is alert, awake and coherent)  --Discussed with patient's daughter at bedside  Consults  :  na  DVT Prophylaxis  :  Lovenox -  - SCDs  Lab Results  Component Value Date   PLT 217 03/17/2020    Inpatient Medications  Scheduled Meds:  amLODipine  10 mg Oral QPM   aspirin EC  243 mg Oral QHS   Chlorhexidine Gluconate Cloth  6 each Topical Daily   docusate sodium  300 mg Oral QHS   enoxaparin (LOVENOX) injection  30 mg Subcutaneous Q24H   irbesartan  300 mg Oral Daily    labetalol  100 mg Oral TID   levothyroxine  112 mcg Oral Daily   pantoprazole  40 mg Oral Daily   potassium chloride  40 mEq Oral Q3H   potassium chloride  40 mEq Oral Once   sodium chloride  2 g Oral Once   Continuous Infusions:  sodium chloride     magnesium sulfate bolus IVPB     sodium chloride (hypertonic) 40 mL/hr at 03/18/20 0234   PRN Meds:.acetaminophen **OR** acetaminophen, LORazepam, ondansetron **OR** ondansetron (ZOFRAN) IV    Anti-infectives (From admission, onward)   None        Objective:   Vitals:   03/18/20 0900 03/18/20 1000 03/18/20 1100 03/18/20 1200  BP: (!) 162/83 (!) 184/50 (!) 143/53 (!) 156/44  Pulse: 72 76 70 69  Resp: 18 16 14 17   Temp:      TempSrc:      SpO2: 96% 95% 96% 96%  Weight:      Height:        Wt Readings from Last 3 Encounters:  03/17/20 59 kg  02/13/19 59.9 kg  09/06/15 61 kg     Intake/Output Summary (Last 24 hours) at 03/18/2020 1232 Last data filed at 03/18/2020 0900 Gross per 24 hour  Intake 1230 ml  Output 800 ml  Net 430 ml    Physical Exam  Gen:- Awake Alert,  In no apparent distress  HEENT:- [place].AT, No sclera icterus Ears-slightly HOH Neck-Supple Neck,No JVD,.  Lungs-  CTAB , fair symmetrical air movement CV- S1, S2 normal, regular  Abd-  +ve B.Sounds, Abd Soft, No tenderness,    Extremity/Skin:- No  edema, pedal pulses present  Psych-affect is appropriate, oriented x3 Neuro-generalized weakness, no new focal deficits, no tremors   Data Review:   Micro Results Recent Results (from the past 240 hour(s))  SARS Coronavirus 2 by RT PCR (hospital order, performed in [facility] hospital lab) Nasopharyngeal Nasopharyngeal Swab     Status: None   Collection Time: 03/17/20  9:50 PM   Specimen: Nasopharyngeal Swab  Result Value Ref Range Status   SARS Coronavirus 2 NEGATIVE NEGATIVE Final    Comment: (NOTE) SARS-CoV-2 target nucleic acids are NOT DETECTED.  The SARS-CoV-2 RNA is generally  detectable in upper and lower respiratory specimens during the acute phase of infection. The lowest concentration of SARS-CoV-2 viral copies this assay can detect is 250 copies / mL. A negative result does not preclude SARS-CoV-2 infection and should not be used as the sole basis for treatment or other patient management decisions.  A negative result may occur with improper specimen collection / handling, submission of specimen other than nasopharyngeal swab, presence of viral mutation(s) within the areas targeted by this assay, and inadequate number of viral copies (<250 copies / mL). A negative result must be combined with clinical observations,  patient history, and epidemiological information.  Fact Sheet for Patients:   StrictlyIdeas.no  Fact Sheet for Healthcare Providers: BankingDealers.co.za  This test is not yet approved or  cleared by the [place] FDA and has been authorized for detection and/or diagnosis of SARS-CoV-2 by FDA under an Emergency Use Authorization (EUA).  This EUA will remain in effect (meaning this test can be used) for the duration of the COVID-19 declaration under Section 564(b)(1) of the Act, 21 U.S.C. section 360bbb-3(b)(1), unless the authorization is terminated or revoked sooner.  Performed at [facility], [address], [place], [place] [address]   MRSA PCR Screening     Status: None   Collection Time: 03/18/20  1:01 AM   Specimen: Nasal Mucosa; Nasopharyngeal  Result Value Ref Range Status   MRSA by PCR NEGATIVE NEGATIVE Final    Comment:        The GeneXpert MRSA Assay (FDA approved for NASAL specimens only), is one component of a comprehensive MRSA colonization surveillance program. It is not intended to diagnose MRSA infection nor to guide or monitor treatment for MRSA infections. Performed at [facility], [address], [place], [place] [address]     Radiology Reports DG Chest 2  View  Result Date: 03/17/2020 CLINICAL DATA:  Pneumonia, cough, generalized weakness EXAM: CHEST - 2 VIEW COMPARISON:  03/02/2011 FINDINGS: Frontal and lateral views of the chest demonstrate an unremarkable cardiac silhouette. No airspace disease, effusion, or pneumothorax. Upper lobe predominant emphysema again noted. No acute bony abnormalities. IMPRESSION: 1. Stable emphysema, no acute process. Electronically Signed   By: Randa Ngo M.D.   On: 03/17/2020 17:33     CBC Recent Labs  Lab 03/17/20 1717  WBC 4.8  HGB 12.3  HCT 35.4*  PLT 217  MCV 94.4  MCH 32.8  MCHC 34.7  RDW 12.6  LYMPHSABS 0.7  MONOABS 0.3  EOSABS 0.0  BASOSABS 0.0    Chemistries  Recent Labs  Lab 03/17/20 1717 03/17/20 2327 03/18/20 0000 03/18/20 0117 03/18/20 0522  NA 119* 120*  --  119* 126*  K 3.7  --   --   --  3.1*  CL 87*  --   --   --  95*  CO2 22  --   --   --  22  GLUCOSE 145*  --   --   --  99  BUN 12  --   --   --  9  CREATININE 0.50  --   --   --  0.49  CALCIUM 8.5*  --   --   --  8.1*  MG  --   --  1.5*  --   --   AST 22  --   --   --   --   ALT 23  --   --   --   --   ALKPHOS 76  --   --   --   --   BILITOT 0.5  --   --   --   --    ------------------------------------------------------------------------------------------------------------------ No results for input(s): CHOL, HDL, LDLCALC, TRIG, CHOLHDL, LDLDIRECT in the last 72 hours.  No results found for: HGBA1C ------------------------------------------------------------------------------------------------------------------ Recent Labs    03/18/20 0000  TSH 0.764   ------------------------------------------------------------------------------------------------------------------ No results for input(s): VITAMINB12, FOLATE, FERRITIN, TIBC, IRON, RETICCTPCT in the last 72 hours.  Coagulation profile No results for input(s): INR, PROTIME in the last 168 hours.  No results for input(s): DDIMER in the last 72  hours.  Cardiac Enzymes No results for input(s): CKMB, TROPONINI, MYOGLOBIN in the last 168 hours.  Invalid input(s): CK ------------------------------------------------------------------------------------------------------------------ No results found for: BNP   Roxan Hockey M.D on 03/18/2020 at 12:32 PM  Go to [email or web address] - for contact info  [facility] - Office  [phone]

## 2020-03-18 NOTE — Discharge Instructions (Signed)
1)Please make appointment with endocrinologist Dr. Loni Beckwith due to Recurrent episodes of hypokalemia, hyponatremia and elevated BP 2)The combination of of Levaquin/levofloxacin antibiotic and low sodium of 119 probably triggered the seizures  3)Limit your Free water  intake to no more than 50 ounces (1.5 Liters) per day, you may drink other fluids including soups as desired  4) you need repeat BMP blood test next week Tuesday or Wednesday

## 2020-03-18 NOTE — Progress Notes (Signed)
Report called and given to Larene Beach, RN on Dept 300. Pt to be transported to room 326 via WC.

## 2020-03-19 DIAGNOSIS — E039 Hypothyroidism, unspecified: Secondary | ICD-10-CM

## 2020-03-19 DIAGNOSIS — R569 Unspecified convulsions: Secondary | ICD-10-CM

## 2020-03-19 DIAGNOSIS — I10 Essential (primary) hypertension: Secondary | ICD-10-CM

## 2020-03-19 DIAGNOSIS — K219 Gastro-esophageal reflux disease without esophagitis: Secondary | ICD-10-CM

## 2020-03-19 DIAGNOSIS — K5900 Constipation, unspecified: Secondary | ICD-10-CM

## 2020-03-19 LAB — RENAL FUNCTION PANEL
Albumin: 3.3 g/dL — ABNORMAL LOW (ref 3.5–5.0)
Anion gap: 6 (ref 5–15)
BUN: 9 mg/dL (ref 8–23)
CO2: 25 mmol/L (ref 22–32)
Calcium: 8.2 mg/dL — ABNORMAL LOW (ref 8.9–10.3)
Chloride: 105 mmol/L (ref 98–111)
Creatinine, Ser: 0.45 mg/dL (ref 0.44–1.00)
GFR calc Af Amer: >60 mL/min (ref >60)
GFR calc non Af Amer: >60 mL/min (ref >60)
Glucose, Bld: 93 mg/dL (ref 70–99)
Phosphorus: 1.6 mg/dL — ABNORMAL LOW (ref 2.5–4.6)
Potassium: 4.9 mmol/L (ref 3.5–5.1)
Sodium: 136 mmol/L (ref 135–145)

## 2020-03-19 LAB — LEGIONELLA PNEUMOPHILA SEROGP 1 UR AG: L. pneumophila Serogp 1 Ur Ag: NEGATIVE

## 2020-03-19 LAB — MAGNESIUM: Magnesium: 2.1 mg/dL (ref 1.7–2.4)

## 2020-03-19 LAB — CORTISOL: Cortisol, Plasma: 16.5 ug/dL

## 2020-03-19 MED ORDER — POTASSIUM PHOSPHATES 15 MMOLE/5ML IV SOLN: 45.0000 mmol | Freq: Once | INTRAVENOUS | Status: DC

## 2020-03-19 MED ORDER — PANTOPRAZOLE SODIUM 40 MG PO TBEC: 40.0000 mg | DELAYED_RELEASE_TABLET | Freq: Every day | ORAL | 0 refills | Status: DC

## 2020-03-19 MED ORDER — SODIUM PHOSPHATES 45 MMOLE/15ML IV SOLN: 45.0000 mmol | Freq: Once | INTRAVENOUS | Status: DC

## 2020-03-19 MED ORDER — SODIUM PHOSPHATES 45 MMOLE/15ML IV SOLN
30.0000 mmol | Freq: Once | INTRAVENOUS | Status: AC
  Administered 2020-03-19: 30 mmol via INTRAVENOUS
  Filled 2020-03-19: qty 10

## 2020-03-19 NOTE — Plan of Care (Signed)

## 2020-03-19 NOTE — Discharge Summary (Signed)
Physician Discharge Summary  Kaitlyn Sanchez TIR:[id number] DOB: [birth year] DOA: 03/17/2020  PCP: [name], MD  Admit date: 03/17/2020 Discharge date: 03/19/2020  Admitted From: Home Disposition: Home  Recommendations for Outpatient Follow-up:  1. Follow up with PCP in 1-2 weeks 2. Please obtain BMP/CBC in one week 3. Patient would benefit from outpatient referral to endocrinology in light of recurrent electrolyte abnormalities, hypertension.  Discharge Condition: Stable CODE STATUS: Full code Diet recommendation: Heart healthy  Brief/Interim Summary: 85 y.o.femalewith medical history significant ofHLD, hypothyroidism and HTN admitted on 03/17/2020 after presenting with generalized weakness and URI symptoms for several days PTA, had tonic-clonic seizures while in the ED on 03/17/2020.  Was found to have a sodium of 119 and was started on 3% hypertonic saline  Discharge Diagnoses:  Principal Problem:   Hyponatremia--very symptomatic with seizures Active Problems:   Constipation   Essential hypertension   GERD (gastroesophageal reflux disease)   Hypothyroidism   Hyperlipidemia   Hypocalcemia   Seizure--in the setting of low sodium and Levaquin use  1. Symptomatic hyponatremia.  Patient presented with a serum sodium of 119.  She was found to have a tonic-clonic seizures in the emergency room.  She was treated with 3% hypertonic saline with improvement of serum sodium levels.  She had denied any emesis or diarrhea as well as thiazide diuretic use.  She denied drinking excessive amounts of free water.  A.m. cortisol level was checked and was in normal range.  Further work-up including urine osmolarity of 415, serum osmolarity 256 and urine sodium of 69.  At this point, serum sodium has improved back to normal range.  She is not having any further symptoms.  She has been advised to follow-up with endocrinology in light of electrolyte abnormalities and hypertension.  Will likely need  further outpatient work-up.  She reports having a similar episode in the past approximately 5 years ago. 2. Seizures.  Related to severe hyponatremia.  She was also recently started on Levaquin for upper respiratory tract infection prior to admission.  Since sodium has corrected, she did not have any further seizures.  No indication for antiepileptics at this time. 3. Hypertension.  On multiple agents including amlodipine, labetalol, Avapro.  Continue to monitor as an outpatient 4. Hypothyroidism.  Continued on Synthroid.  TSH 0.76. 5. GERD.  Continue on Protonix.  She does describe indigestion.  She also has a recurrent cough which may be precipitated by acid reflux. 6. Hypomagnesemia/hypophosphatemia.  This is replaced with IV supplementation.  Discharge Instructions  Discharge Instructions    Diet - low sodium heart healthy   Complete by: As directed    Increase activity slowly   Complete by: As directed      Allergies as of 03/19/2020      Reactions   Levaquin [levofloxacin] Other (See Comments)   Seizures      Medication List    TAKE these medications   amLODipine 10 MG tablet Commonly known as: NORVASC Take 10 mg by mouth every evening.   aspirin EC 81 MG tablet Take 243 mg by mouth at bedtime.   CALTRATE 600+D PLUS PO Take 1 tablet by mouth daily.   Chelated Potassium 99 MG Tabs Take 1 tablet by mouth daily.   docusate sodium 100 MG capsule Commonly known as: COLACE Take 300 mg by mouth at bedtime.   Flaxseed Oil 1000 MG Caps Take 1,000 mg by mouth daily.   glucosamine-chondroitin 500-400 MG tablet Take 1 tablet by mouth daily.  HAIR/SKIN/NAILS PO Take 1 tablet by mouth daily.   labetalol 100 MG tablet Commonly known as: NORMODYNE Take 1 tablet by mouth 3 (three) times daily.   levothyroxine 112 MCG tablet Commonly known as: SYNTHROID Take 112 mcg by mouth daily.   LORazepam 1 MG tablet Commonly known as: ATIVAN Take 0.5 mg by mouth at bedtime as  needed for anxiety.   multivitamin-iron-minerals-folic acid chewable tablet Chew 1 tablet by mouth daily.   olmesartan 40 MG tablet Commonly known as: BENICAR Take 40 mg by mouth daily.   Omega 3 1200 MG Caps Take 1,200 mg by mouth 2 (two) times daily.   pantoprazole 40 MG tablet Commonly known as: PROTONIX Take 1 tablet (40 mg total) by mouth daily. Start taking on: March 20, 2020   vitamin B-12 1000 MCG tablet Commonly known as: CYANOCOBALAMIN Take 1,000 mcg by mouth daily.   vitamin C 500 MG tablet Commonly known as: ASCORBIC ACID Take 500 mg by mouth daily.   zinc gluconate 50 MG tablet Take 50 mg by mouth daily.       Follow-up Information    Nida, Marella Chimes, MD. Schedule an appointment as soon as possible for a visit in 1 week(s).   Specialty: Endocrinology Why: Recurrent episodes of hypokalemia, hyponatremia and elevated BP Contact information: [place] [address] [phone]              Allergies  Allergen Reactions  . Levaquin [Levofloxacin] Other (See Comments)    Seizures    Consultations:     Procedures/Studies: DG Chest 2 View  Result Date: 03/17/2020 CLINICAL DATA:  Pneumonia, cough, generalized weakness EXAM: CHEST - 2 VIEW COMPARISON:  03/02/2011 FINDINGS: Frontal and lateral views of the chest demonstrate an unremarkable cardiac silhouette. No airspace disease, effusion, or pneumothorax. Upper lobe predominant emphysema again noted. No acute bony abnormalities. IMPRESSION: 1. Stable emphysema, no acute process. Electronically Signed   By: Randa Ngo M.D.   On: 03/17/2020 17:33       Subjective: No dizziness or lightheadedness.  No nausea or vomiting.  Discharge Exam: Vitals:   03/18/20 2022 03/19/20 0030 03/19/20 0425 03/19/20 1528  BP: (!) 139/53 (!) 127/55 (!) 152/56 (!) 137/54  Pulse: 69 68 64 66  Resp: 16 18 18 20   Temp: 97.9 F (36.6 C) 99.1 F (37.3 C) 98.1 F (36.7 C) 98.5 F (36.9 C)   TempSrc: Oral Oral Oral Oral  SpO2: 96% 97% 97% 97%  Weight:      Height:        General: Pt is alert, awake, not in acute distress Cardiovascular: RRR, S1/S2 +, no rubs, no gallops Respiratory: CTA bilaterally, no wheezing, no rhonchi Abdominal: Soft, NT, ND, bowel sounds + Extremities: no edema, no cyanosis    The results of significant diagnostics from this hospitalization (including imaging, microbiology, ancillary and laboratory) are listed below for reference.     Microbiology: Recent Results (from the past 240 hour(s))  SARS Coronavirus 2 by RT PCR (hospital order, performed in [facility] hospital lab) Nasopharyngeal Nasopharyngeal Swab     Status: None   Collection Time: 03/17/20  9:50 PM   Specimen: Nasopharyngeal Swab  Result Value Ref Range Status   SARS Coronavirus 2 NEGATIVE NEGATIVE Final    Comment: (NOTE) SARS-CoV-2 target nucleic acids are NOT DETECTED.  The SARS-CoV-2 RNA is generally detectable in upper and lower respiratory specimens during the acute phase of infection. The lowest concentration of SARS-CoV-2 viral copies  this assay can detect is 250 copies / mL. A negative result does not preclude SARS-CoV-2 infection and should not be used as the sole basis for treatment or other patient management decisions.  A negative result may occur with improper specimen collection / handling, submission of specimen other than nasopharyngeal swab, presence of viral mutation(s) within the areas targeted by this assay, and inadequate number of viral copies (<250 copies / mL). A negative result must be combined with clinical observations, patient history, and epidemiological information.  Fact Sheet for Patients:   StrictlyIdeas.no  Fact Sheet for Healthcare Providers: BankingDealers.co.za  This test is not yet approved or  cleared by the [place] FDA and has been authorized for detection and/or diagnosis of  SARS-CoV-2 by FDA under an Emergency Use Authorization (EUA).  This EUA will remain in effect (meaning this test can be used) for the duration of the COVID-19 declaration under Section 564(b)(1) of the Act, 21 U.S.C. section 360bbb-3(b)(1), unless the authorization is terminated or revoked sooner.  Performed at [facility], [address], [place], [place] [address]   MRSA PCR Screening     Status: None   Collection Time: 03/18/20  1:01 AM   Specimen: Nasal Mucosa; Nasopharyngeal  Result Value Ref Range Status   MRSA by PCR NEGATIVE NEGATIVE Final    Comment:        The GeneXpert MRSA Assay (FDA approved for NASAL specimens only), is one component of a comprehensive MRSA colonization surveillance program. It is not intended to diagnose MRSA infection nor to guide or monitor treatment for MRSA infections. Performed at [facility], [address] [place], [place] [address]      Labs: BNP (last 3 results) No results for input(s): BNP in the last 8760 hours. Basic Metabolic Panel: Recent Labs  Lab 03/17/20 1717 03/17/20 2327 03/18/20 0000 03/18/20 0117 03/18/20 0522 03/18/20 1551 03/18/20 2145 03/19/20 0727  NA 119*   < >  --  119* 126* 131* 132* 136  K 3.7  --   --   --  3.1* 3.5 4.3 4.9  CL 87*  --   --   --  95* 100 102 105  CO2 22  --   --   --  22 22 24 25   GLUCOSE 145*  --   --   --  99 133* 91 93  BUN 12  --   --   --  9 11 11 9   CREATININE 0.50  --   --   --  0.49 0.55 0.51 0.45  CALCIUM 8.5*  --   --   --  8.1* 8.2* 7.7* 8.2*  MG  --   --  1.5*  --   --   --   --  2.1  PHOS  --   --  2.6  --   --  1.7* 1.7* 1.6*   < > = values in this interval not displayed.   Liver Function Tests: Recent Labs  Lab 03/17/20 1717 03/18/20 1551 03/18/20 2145 03/19/20 0727  AST 22  --   --   --   ALT 23  --   --   --   ALKPHOS 76  --   --   --   BILITOT 0.5  --   --   --   PROT 6.7  --   --   --   ALBUMIN 4.1 3.6 3.5 3.3*   No results for input(s): LIPASE,  AMYLASE in the last  168 hours. No results for input(s): AMMONIA in the last 168 hours. CBC: Recent Labs  Lab 03/17/20 1717  WBC 4.8  NEUTROABS 3.8  HGB 12.3  HCT 35.4*  MCV 94.4  PLT 217   Cardiac Enzymes: No results for input(s): CKTOTAL, CKMB, CKMBINDEX, TROPONINI in the last 168 hours. BNP: Invalid input(s): POCBNP CBG: No results for input(s): GLUCAP in the last 168 hours. D-Dimer No results for input(s): DDIMER in the last 72 hours. Hgb A1c No results for input(s): HGBA1C in the last 72 hours. Lipid Profile No results for input(s): CHOL, HDL, LDLCALC, TRIG, CHOLHDL, LDLDIRECT in the last 72 hours. Thyroid function studies Recent Labs    03/18/20 0000  TSH 0.764   Anemia work up No results for input(s): VITAMINB12, FOLATE, FERRITIN, TIBC, IRON, RETICCTPCT in the last 72 hours. Urinalysis    Component Value Date/Time   COLORURINE YELLOW 03/17/2020 2009   APPEARANCEUR CLEAR 03/17/2020 2009   LABSPEC 1.012 03/17/2020 2009   PHURINE 7.0 03/17/2020 2009   GLUCOSEU 150 (A) 03/17/2020 2009   HGBUR NEGATIVE 03/17/2020 2009   BILIRUBINUR NEGATIVE 03/17/2020 2009   KETONESUR 80 (A) 03/17/2020 2009   PROTEINUR NEGATIVE 03/17/2020 2009   NITRITE NEGATIVE 03/17/2020 2009   LEUKOCYTESUR NEGATIVE 03/17/2020 2009   Sepsis Labs Invalid input(s): PROCALCITONIN,  WBC,  LACTICIDVEN Microbiology Recent Results (from the past 240 hour(s))  SARS Coronavirus 2 by RT PCR (hospital order, performed in [facility] hospital lab) Nasopharyngeal Nasopharyngeal Swab     Status: None   Collection Time: 03/17/20  9:50 PM   Specimen: Nasopharyngeal Swab  Result Value Ref Range Status   SARS Coronavirus 2 NEGATIVE NEGATIVE Final    Comment: (NOTE) SARS-CoV-2 target nucleic acids are NOT DETECTED.  The SARS-CoV-2 RNA is generally detectable in upper and lower respiratory specimens during the acute phase of infection. The lowest concentration of SARS-CoV-2 viral copies this assay can  detect is 250 copies / mL. A negative result does not preclude SARS-CoV-2 infection and should not be used as the sole basis for treatment or other patient management decisions.  A negative result may occur with improper specimen collection / handling, submission of specimen other than nasopharyngeal swab, presence of viral mutation(s) within the areas targeted by this assay, and inadequate number of viral copies (<250 copies / mL). A negative result must be combined with clinical observations, patient history, and epidemiological information.  Fact Sheet for Patients:   StrictlyIdeas.no  Fact Sheet for Healthcare Providers: BankingDealers.co.za  This test is not yet approved or  cleared by the [place] FDA and has been authorized for detection and/or diagnosis of SARS-CoV-2 by FDA under an Emergency Use Authorization (EUA).  This EUA will remain in effect (meaning this test can be used) for the duration of the COVID-19 declaration under Section 564(b)(1) of the Act, 21 U.S.C. section 360bbb-3(b)(1), unless the authorization is terminated or revoked sooner.  Performed at [facility], [address], [place], [place] [address]   MRSA PCR Screening     Status: None   Collection Time: 03/18/20  1:01 AM   Specimen: Nasal Mucosa; Nasopharyngeal  Result Value Ref Range Status   MRSA by PCR NEGATIVE NEGATIVE Final    Comment:        The GeneXpert MRSA Assay (FDA approved for NASAL specimens only), is one component of a comprehensive MRSA colonization surveillance program. It is not intended to diagnose MRSA infection nor to guide or monitor treatment for MRSA infections. Performed at  [address] [place], [place] [address]      Time coordinating discharge: 31mins  SIGNED:   Kathie Dike, MD  [facility] 03/19/2020, 9:04 PM   If 7PM-7AM, please contact night-coverage [email or web address]

## 2020-03-19 NOTE — Progress Notes (Signed)
Nsg Discharge Note  Admit Date:  03/17/2020 Discharge date: 03/19/2020   Adele Dan to be D/C'd Home per MD order.  AVS completed.  Copy for chart, and copy for patient signed, and dated. Removed IV-clean, dry, intact. Reviewed d/c paperwork with patient and daughter. Answered new questions. Walked stable patient, daughter, and belongings to Media planner.  Patient/caregiver able to verbalize understanding.  Discharge Medication: Allergies as of 03/19/2020      Reactions   Levaquin [levofloxacin] Other (See Comments)   Seizures      Medication List    TAKE these medications   amLODipine 10 MG tablet Commonly known as: NORVASC Take 10 mg by mouth every evening.   aspirin EC 81 MG tablet Take 243 mg by mouth at bedtime.   CALTRATE 600+D PLUS PO Take 1 tablet by mouth daily.   Chelated Potassium 99 MG Tabs Take 1 tablet by mouth daily.   docusate sodium 100 MG capsule Commonly known as: COLACE Take 300 mg by mouth at bedtime.   Flaxseed Oil 1000 MG Caps Take 1,000 mg by mouth daily.   glucosamine-chondroitin 500-400 MG tablet Take 1 tablet by mouth daily.   HAIR/SKIN/NAILS PO Take 1 tablet by mouth daily.   labetalol 100 MG tablet Commonly known as: NORMODYNE Take 1 tablet by mouth 3 (three) times daily.   levothyroxine 112 MCG tablet Commonly known as: SYNTHROID Take 112 mcg by mouth daily.   LORazepam 1 MG tablet Commonly known as: ATIVAN Take 0.5 mg by mouth at bedtime as needed for anxiety.   multivitamin-iron-minerals-folic acid chewable tablet Chew 1 tablet by mouth daily.   olmesartan 40 MG tablet Commonly known as: BENICAR Take 40 mg by mouth daily.   Omega 3 1200 MG Caps Take 1,200 mg by mouth 2 (two) times daily.   pantoprazole 40 MG tablet Commonly known as: PROTONIX Take 1 tablet (40 mg total) by mouth daily. Start taking on: March 20, 2020   vitamin B-12 1000 MCG tablet Commonly known as: CYANOCOBALAMIN Take 1,000 mcg by mouth daily.    vitamin C 500 MG tablet Commonly known as: ASCORBIC ACID Take 500 mg by mouth daily.   zinc gluconate 50 MG tablet Take 50 mg by mouth daily.       Discharge Assessment: Vitals:   03/19/20 0425 03/19/20 1528  BP: (!) 152/56 (!) 137/54  Pulse: 64 66  Resp: 18 20  Temp: 98.1 F (36.7 C) 98.5 F (36.9 C)  SpO2: 97% 97%   Skin clean, dry and intact without evidence of skin break down, no evidence of skin tears noted. IV catheter discontinued intact. Site without signs and symptoms of complications - no redness or edema noted at insertion site, patient denies c/o pain - only slight tenderness at site.  Dressing with slight pressure applied.  D/c Instructions-Education: Discharge instructions given to patient/family with verbalized understanding. D/c education completed with patient/family including follow up instructions, medication list, d/c activities limitations if indicated, with other d/c instructions as indicated by MD - patient able to verbalize understanding, all questions fully answered. Patient instructed to return to ED, call 911, or call MD for any changes in condition.  Patient escorted via [place], and D/C home via private auto.  Santa Lighter, RN 03/19/2020 5:02 PM

## 2020-03-19 NOTE — Evaluation (Signed)
Physical Therapy Evaluation Patient Details Name: Kaitlyn Sanchez MRN: [id number] DOB: 11/28/1934 Today's Date: 03/19/2020   History of Present Illness  Kaitlyn Sanchez is a 85 y.o. female with medical history significant of essential hypertension, pure, mild coronary atherosclerosis, hyperlipidemia, hypothyroidism who is brought by her daughter to the emergency department due to persistent generalized weakness associated with cough, sinus and no other URI symptoms.  She went to [facility] in the ambulance was prescribed Levaquin yesterday, however she continues to have worsening symptoms today.  Her daughter states that 4 years ago she had an episode of weakness similar to this episode, was admitted at [facility] after she was found found to be hypokalemic and hyponatremic.  Her appetite has been decreased, although she has been trying to eat 3 times a day.  Clinical Impression  PT I in aspects of mobility no need for skilled PT     Follow Up Recommendations No PT follow up    Equipment Recommendations  3in1 (PT)    Recommendations for Other Services   none    Precautions / Restrictions Precautions Precautions: None Restrictions Weight Bearing Restrictions: No      Mobility  Bed Mobility      Independent             Transfers    Independent                 Ambulation/Gait     I ; pt ambulated x 250 ft with head turns and varying  Speeds with no loss of balance.                             Pertinent Vitals/Pain  no pain vebalized    Home Living Family/patient expects to be discharged to:: Private residence Living Arrangements: Alone Available Help at Discharge: Family Type of Home: House Home Access: Stairs to enter Entrance Stairs-Rails: Right Entrance Stairs-Number of Steps: 3 Home Layout: Able to live on main level with bedroom/bathroom   Additional Comments: Pt may benefit from a tub bench to assist in getting into the bath area.     Prior Function Level of Independence: Independent               Hand Dominance   Dominant Hand: Right    Extremity/Trunk Assessment   Upper Extremity Assessment Upper Extremity Assessment: Overall WFL for tasks assessed    Lower Extremity Assessment Lower Extremity Assessment: Overall WFL for tasks assessed       Communication   Communication: No difficulties  Cognition Arousal/Alertness: Awake/alert   Overall Cognitive Status: Within Functional Limits for tasks assessed                                                   Assessment/Plan    PT Assessment [place] does not need any further PT services         PT Goals (Current goals can be found in the Care Plan section)   no goals pt does not need therapy                AM-PAC PT "6 Clicks" Mobility  Outcome Measure Help needed turning from your back to your side while in a flat bed without using bedrails?: None Help needed moving from lying on your  back to sitting on the side of a flat bed without using bedrails?: None Help needed moving to and from a bed to a chair (including a wheelchair)?: None Help needed standing up from a chair using your arms (e.g., wheelchair or bedside chair)?: None Help needed to walk in hospital room?: None Help needed climbing 3-5 steps with a railing? : A Little 6 Click Score: 23    End of Session Equipment Utilized During Treatment: Gait belt Activity Tolerance: Patient tolerated treatment well Patient left: in chair;with family/visitor present Nurse Communication: Mobility status PT Visit Diagnosis: Other abnormalities of gait and mobility (R26.89)    Time: 7225-7505 PT Time Calculation (min) (ACUTE ONLY): 28 min   Charges:   PT Evaluation $PT Eval Low Complexity: [place], PT CLT [phone]  03/19/2020, 11:28 AM

## 2020-03-19 NOTE — Plan of Care (Signed)
  Problem: Education: Goal: Knowledge of General Education information will improve Description: Including pain rating scale, medication(s)/side effects and non-pharmacologic comfort measures 03/19/2020 1549 by Santa Lighter, RN Outcome: Adequate for Discharge 03/19/2020 1128 by Santa Lighter, RN Outcome: Progressing   Problem: Health Behavior/Discharge Planning: Goal: Ability to manage health-related needs will improve 03/19/2020 1549 by Santa Lighter, RN Outcome: Adequate for Discharge 03/19/2020 1128 by Santa Lighter, RN Outcome: Progressing   Problem: Clinical Measurements: Goal: Ability to maintain clinical measurements within normal limits will improve Outcome: Adequate for Discharge Goal: Will remain free from infection Outcome: Adequate for Discharge Goal: Diagnostic test results will improve Outcome: Adequate for Discharge Goal: Respiratory complications will improve Outcome: Adequate for Discharge Goal: Cardiovascular complication will be avoided Outcome: Adequate for Discharge   Problem: Activity: Goal: Risk for activity intolerance will decrease 03/19/2020 1549 by Santa Lighter, RN Outcome: Adequate for Discharge 03/19/2020 1128 by Santa Lighter, RN Outcome: Progressing   Problem: Nutrition: Goal: Adequate nutrition will be maintained Outcome: Adequate for Discharge   Problem: Coping: Goal: Level of anxiety will decrease 03/19/2020 1549 by Santa Lighter, RN Outcome: Adequate for Discharge 03/19/2020 1128 by Santa Lighter, RN Outcome: Progressing   Problem: Pain Managment: Goal: General experience of comfort will improve Outcome: Adequate for Discharge   Problem: Safety: Goal: Ability to remain free from injury will improve Outcome: Adequate for Discharge   Problem: Skin Integrity: Goal: Risk for impaired skin integrity will decrease Outcome: Adequate for Discharge   Problem: Education: Goal: Expressions of having a comfortable level of knowledge  regarding the disease process will increase Outcome: Adequate for Discharge   Problem: Coping: Goal: Ability to adjust to condition or change in health will improve Outcome: Adequate for Discharge Goal: Ability to identify appropriate support needs will improve Outcome: Adequate for Discharge   Problem: Health Behavior/Discharge Planning: Goal: Compliance with prescribed medication regimen will improve Outcome: Adequate for Discharge   Problem: Self-Concept: Goal: Level of anxiety will decrease Outcome: Adequate for Discharge Goal: Ability to verbalize feelings about condition will improve Outcome: Adequate for Discharge   Problem: Safety: Goal: Verbalization of understanding the information provided will improve Outcome: Adequate for Discharge   Problem: Clinical Measurements: Goal: Complications related to the disease process, condition or treatment will be avoided or minimized Outcome: Adequate for Discharge Goal: Diagnostic test results will improve Outcome: Adequate for Discharge

## 2020-03-21 DIAGNOSIS — R569 Unspecified convulsions: Secondary | ICD-10-CM | POA: Diagnosis not present

## 2020-03-21 DIAGNOSIS — E871 Hypo-osmolality and hyponatremia: Secondary | ICD-10-CM | POA: Diagnosis not present

## 2020-03-21 DIAGNOSIS — Z6823 Body mass index (BMI) 23.0-23.9, adult: Secondary | ICD-10-CM | POA: Diagnosis not present

## 2020-03-21 LAB — BASIC METABOLIC PANEL
BUN: 13 (ref 4–21)
Creatinine: 0.7 (ref 0.5–1.1)
Sodium: 136 — AB (ref 137–147)

## 2020-03-21 LAB — COMPREHENSIVE METABOLIC PANEL
GFR calc Af Amer: 90
GFR calc non Af Amer: 78

## 2020-03-31 ENCOUNTER — Other Ambulatory Visit: Payer: Self-pay | Admitting: *Deleted

## 2020-03-31 ENCOUNTER — Encounter: Payer: Self-pay | Admitting: *Deleted

## 2020-03-31 NOTE — Patient Outreach (Addendum)
[facility]) Care Management  03/31/2020  Kaitlyn Sanchez Kaitlyn Sanchez [id number]   RED ON [place] - General Discharge Day # 4 Date: 6/17 Red Alert Reason: Not read discharge papers, Feeling sad/hopeless/empty/anxious, and Other questions/problems   Outreach attempt #1, unsuccessful.  HIPAA compliant voice message left.   Update:  Call received back from member, identity verified.  This care manager introduced self and stated purpose of call.  Kaitlyn Sanchez care management services explained.    Social: [place], multiple family members living the farm in separate houses as well.  Has children that are available if needed.  State her legs are still a little weak but she is independent in all ADL's, does not use DME for mobility.    She report she has been anxious and fearful of what may happen if she try to proceed with her normal daily living.  She has been drinking gatorade and adding electrolyte mix to her water, unsure if she should be drinking them.  Discharge instructions reviewed, advised that it didn't state for her to increase her electrolyte replacement but to limit her free water intake.  She will continue to drink at least one gatorade or one electrolyte mix daily as she has anxiety about her levels decreasing again but she is also fearful that she will overdo it and make her blood pressure increase.  Advised to adapt back to her normal routine as much as possible.  Conditions: Per chart, has history of HTN, GERD, Hypothyroidism, and HLD.  Medications: Reviewed with member, denies needing any financial assistance.  Does not have Protonix but does have Famotidine that she's taking.   Appointments: Had follow up appointment with PCP on 6/14, will have another visit within the next month. She will have repeat labs done at time.  Has appointment with endocrinologist on 8/4 as recommended.  Plan: RN CM will send successful outreach letter  and follow up within the next 2 weeks.  Fall Risk  03/31/2020  Falls in the past year? 0  Number falls in past yr: 0  Injury with Fall? 0   Depression screen PHQ 2/9 03/31/2020  Decreased Interest 0  Down, Depressed, Hopeless 1  PHQ - 2 Score 1   THN CM Care Plan Problem One     Most Recent Value  Care Plan Problem One Risk for readmission related to low sodium level as evidenced by recent hospitalization  Role Documenting the Problem One Care Management [place] for Problem One Active  [facility] Long Term Goal  Member will not be readmitted to hospital within the next 31 days  THN Long Term Goal Start Date 03/31/20  Interventions for Problem One Long Term Goal Discharge instructions reviewed with member.  Educated on importance of following plan of care in effort to decrease risk of readmission  THN CM Short Term Goal #1  Member will report limiting free water to 1.5 liters a day over the next 2 weeks  THN CM Short Term Goal #1 Start Date 03/31/20  Interventions for Short Term Goal #1 Member educated on fluid restrictions per discharge instructions  THN CM Short Term Goal #2  Member will report monitoring blood pressure daily over the next 2 weeks  THN CM Short Term Goal #2 Start Date 03/31/20  Interventions for Short Term Goal #2 Reminded member of importance of daily monitoring, especially with sodium levels being abnormal and increasing sodium intake  Valente David, [place], MSN [place] [phone]

## 2020-04-06 DIAGNOSIS — M810 Age-related osteoporosis without current pathological fracture: Secondary | ICD-10-CM | POA: Diagnosis not present

## 2020-04-06 DIAGNOSIS — I10 Essential (primary) hypertension: Secondary | ICD-10-CM | POA: Diagnosis not present

## 2020-04-06 DIAGNOSIS — E063 Autoimmune thyroiditis: Secondary | ICD-10-CM | POA: Diagnosis not present

## 2020-04-06 DIAGNOSIS — E7849 Other hyperlipidemia: Secondary | ICD-10-CM | POA: Diagnosis not present

## 2020-04-07 DIAGNOSIS — M9903 Segmental and somatic dysfunction of lumbar region: Secondary | ICD-10-CM | POA: Diagnosis not present

## 2020-04-07 DIAGNOSIS — M9901 Segmental and somatic dysfunction of cervical region: Secondary | ICD-10-CM | POA: Diagnosis not present

## 2020-04-07 DIAGNOSIS — S233XXA Sprain of ligaments of thoracic spine, initial encounter: Secondary | ICD-10-CM | POA: Diagnosis not present

## 2020-04-07 DIAGNOSIS — M9902 Segmental and somatic dysfunction of thoracic region: Secondary | ICD-10-CM | POA: Diagnosis not present

## 2020-04-07 DIAGNOSIS — S134XXA Sprain of ligaments of cervical spine, initial encounter: Secondary | ICD-10-CM | POA: Diagnosis not present

## 2020-04-08 ENCOUNTER — Other Ambulatory Visit: Payer: Self-pay

## 2020-04-08 ENCOUNTER — Ambulatory Visit (HOSPITAL_COMMUNITY)
Admission: RE | Admit: 2020-04-08 | Discharge: 2020-04-08 | Disposition: A | Discharge status: Home / Self Care | Payer: Medicare Other | Source: Ambulatory Visit | Attending: Family Medicine | Admitting: Family Medicine

## 2020-04-08 DIAGNOSIS — Z1231 Encounter for screening mammogram for malignant neoplasm of breast: Secondary | ICD-10-CM | POA: Insufficient documentation

## 2020-04-15 ENCOUNTER — Other Ambulatory Visit: Payer: Self-pay | Admitting: *Deleted

## 2020-04-15 NOTE — Patient Outreach (Signed)
Kaitlyn Sanchez) Care Management  04/15/2020  Kaitlyn Sanchez 1934-10-09 [id number]   Call placed to member to complete initial assessment, no answer.  HIPAA compliant voice message left, will follow up within the next 3-4 business days.  Kaitlyn Sanchez, [place], MSN [place] [phone]

## 2020-04-20 ENCOUNTER — Other Ambulatory Visit: Payer: Self-pay | Admitting: *Deleted

## 2020-04-20 NOTE — Patient Outreach (Addendum)
[facility]) Care Management  04/20/2020  Kaitlyn Sanchez 26-Dec-1934 [id number]   Outreach attempt #2, unsuccessful.  HIPAA compliant voice message left.  Will send unsuccessful outreach letter and follow up within the next 3-4 business days.    Update:  Call received back from member, report she is doing well.  Confirms she was seen by her PCP within the last couple weeks but she isn't able to report what was discussed.  State her daughter was with her at the appointment and most information was discussed with her.  She does report having another visit (annual wellness visit) scheduled for within the next 2 weeks, will also has follow up with endocrinologist within the next 3 weeks.  She has continued to monitor her fluid levels, water intake, and gatorade intake.  Denies any urgent concerns, will follow up within the next month.  Goals Addressed              This Visit's Progress     I want to get me sodium leve back to normal (pt-stated)        CARE PLAN ENTRY (see longitudinal plan of care for additional care plan information)  Current Barriers:   Knowledge Deficits related to electrolyte imbalance  Nurse Case Manager Clinical Goal(s):   Over the next 28 days, patient will meet with RN Care Manager to address fluid balance   Over the next 31 days, patient will not experience hospital admission. Hospital Admissions in last 6 months = 1  Over the next 28 days, patient will attend all scheduled medical appointments: AWV and endocrinology  Interventions:   Inter-disciplinary care team collaboration (see longitudinal plan of care)  Provided education to patient re: sodium balance  Discussed plans with patient for ongoing care management follow up and provided patient with direct contact information for care management team  Reviewed scheduled/upcoming provider appointments including:   Patient Self Care Activities:   Attends all scheduled provider  appointments  Performs ADL's independently  Calls provider office for new concerns or questions   Initial goal documentation         Valente David, RN, MSN [place] Manager [phone]

## 2020-05-04 DIAGNOSIS — I10 Essential (primary) hypertension: Secondary | ICD-10-CM | POA: Diagnosis not present

## 2020-05-04 DIAGNOSIS — E871 Hypo-osmolality and hyponatremia: Secondary | ICD-10-CM | POA: Diagnosis not present

## 2020-05-04 DIAGNOSIS — Z6823 Body mass index (BMI) 23.0-23.9, adult: Secondary | ICD-10-CM | POA: Diagnosis not present

## 2020-05-04 DIAGNOSIS — E039 Hypothyroidism, unspecified: Secondary | ICD-10-CM | POA: Diagnosis not present

## 2020-05-04 DIAGNOSIS — E7849 Other hyperlipidemia: Secondary | ICD-10-CM | POA: Diagnosis not present

## 2020-05-06 DIAGNOSIS — M810 Age-related osteoporosis without current pathological fracture: Secondary | ICD-10-CM | POA: Diagnosis not present

## 2020-05-06 DIAGNOSIS — E063 Autoimmune thyroiditis: Secondary | ICD-10-CM | POA: Diagnosis not present

## 2020-05-06 DIAGNOSIS — I10 Essential (primary) hypertension: Secondary | ICD-10-CM | POA: Diagnosis not present

## 2020-05-06 DIAGNOSIS — E7849 Other hyperlipidemia: Secondary | ICD-10-CM | POA: Diagnosis not present

## 2020-05-11 ENCOUNTER — Ambulatory Visit: Payer: Medicare Other | Admitting: "Endocrinology

## 2020-05-11 ENCOUNTER — Encounter: Payer: Self-pay | Admitting: "Endocrinology

## 2020-05-11 ENCOUNTER — Other Ambulatory Visit: Payer: Self-pay

## 2020-05-11 VITALS — BP 158/73 | HR 60 | Ht 62.5 in | Wt 131.2 lb

## 2020-05-11 DIAGNOSIS — E871 Hypo-osmolality and hyponatremia: Secondary | ICD-10-CM | POA: Diagnosis not present

## 2020-05-11 DIAGNOSIS — E890 Postprocedural hypothyroidism: Secondary | ICD-10-CM

## 2020-05-11 NOTE — Progress Notes (Signed)
Endocrinology Consult Note                                            05/11/2020, 12:37 PM   Subjective:    Patient ID: Kaitlyn Sanchez, female    DOB: [birth year], PCP [name], MD   Past Medical History:  Diagnosis Date  . Essential hypertension   . GERD (gastroesophageal reflux disease)   . History of cardiac catheterization 2013   Mild coronary atherosclerosis - [facility]  . Hyperlipidemia   . Hypothyroidism    Past Surgical History:  Procedure Laterality Date  . APPENDECTOMY    . CARPAL TUNNEL RELEASE    . CATARACT EXTRACTION W/PHACO Left 12/28/2013   Procedure: CATARACT EXTRACTION PHACO AND INTRAOCULAR LENS PLACEMENT (IOC);  Surgeon: Tonny Branch, MD;  Location: [facility];  Service: Ophthalmology;  Laterality: Left;  CDE 18.64  . CATARACT EXTRACTION W/PHACO Right 01/07/2014   Procedure: CATARACT EXTRACTION PHACO AND INTRAOCULAR LENS PLACEMENT (IOC);  Surgeon: Tonny Branch, MD;  Location: [facility];  Service: Ophthalmology;  Laterality: Right;  CDE:11.01  . COLONOSCOPY    . ESOPHAGOGASTRODUODENOSCOPY N/A 10/11/2014   RMR:non critical schatzkis ring/HH  . LUMBAR LAMINECTOMY     X 2  . TONSILLECTOMY     Social History   Socioeconomic History  . Marital status: Widowed    Spouse name: Not on file  . Number of children: Not on file  . Years of education: Not on file  . Highest education level: Not on file  Occupational History  . 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 Smoking status: Never Smoker  . Smokeless tobacco: Never Used  Vaping Use  . Vaping Use: Never used  Substance and Sexual Activity  . Alcohol use: No  . Drug use: No  . Sexual activity: Not on file  Other Topics Concern  . Not on file  Social History Narrative  . Not on file   Social Determinants of Health   Financial Resource Strain:   . Difficulty of Paying Living Expenses:   Food Insecurity: No Food Insecurity  . Worried About Charity fundraiser in  the Last Year: Never true  . Ran Out of Food in the Last Year: Never true  Transportation Needs: No Transportation Needs  . Lack of Transportation (Medical): No  . Lack of Transportation (Non-Medical): No  Physical Activity:   . Days of Exercise per Week:   . Minutes of Exercise per Session:   Stress:   . Feeling of Stress :   Social Connections:   . Frequency of Communication with Friends and Family:   . Frequency of Social Gatherings with Friends and Family:   . Attends Religious Services:   . Active Member of Clubs or Organizations:   . Attends Archivist Meetings:   Marland Kitchen Marital Status:    Family History  Problem Relation Age of Onset  . Colon cancer Other        no first degree relatives  . Renal cancer Father   . Heart attack Father   . Glaucoma Mother   . Heart failure Mother    Outpatient Encounter Medications as of 05/11/2020  Medication Sig  . Cholecalciferol (VITAMIN D3) 125 MCG (5000 UT) CAPS Take 1 capsule by mouth daily.  Marland Kitchen amLODipine (  NORVASC) 10 MG tablet Take 10 mg by mouth every evening.   Marland Kitchen aspirin EC 81 MG tablet Take 243 mg by mouth at bedtime.  . Biotin w/ Vitamins C & E (HAIR/SKIN/NAILS PO) Take 1 tablet by mouth daily.   . Calcium Carbonate-Vit D-Min (CALTRATE 600+D PLUS PO) Take 1 tablet by mouth daily.  . Chelated Potassium 99 MG TABS Take 1 tablet by mouth daily.  Marland Kitchen docusate sodium (COLACE) 100 MG capsule Take 300 mg by mouth at bedtime.   . Flaxseed, Linseed, (FLAXSEED OIL) 1000 MG CAPS Take 1,000 mg by mouth daily.   Marland Kitchen glucosamine-chondroitin 500-400 MG tablet Take 1 tablet by mouth daily.  Marland Kitchen labetalol (NORMODYNE) 100 MG tablet Take 1 tablet by mouth 3 (three) times daily.  Marland Kitchen levothyroxine (SYNTHROID) 112 MCG tablet Take 112 mcg by mouth daily.  Marland Kitchen LORazepam (ATIVAN) 1 MG tablet Take 0.5 mg by mouth at bedtime as needed for anxiety.  . multivitamin-iron-minerals-folic acid (CENTRUM) chewable tablet Chew 1 tablet by mouth daily.  Marland Kitchen olmesartan  (BENICAR) 40 MG tablet Take 40 mg by mouth daily.  . Omega 3 1200 MG CAPS Take 1,200 mg by mouth 2 (two) times daily.  . vitamin B-12 (CYANOCOBALAMIN) 1000 MCG tablet Take 1,000 mcg by mouth daily.  . vitamin C (ASCORBIC ACID) 500 MG tablet Take 500 mg by mouth daily.  Marland Kitchen zinc gluconate 50 MG tablet Take 50 mg by mouth daily.  . [DISCONTINUED] pantoprazole (PROTONIX) 40 MG tablet Take 1 tablet (40 mg total) by mouth daily.   No facility-administered encounter medications on file as of 05/11/2020.   ALLERGIES: Allergies  Allergen Reactions  . Levaquin [Levofloxacin] Other (See Comments)    Seizures    VACCINATION STATUS:  There is no immunization history on file for this patient.  HPI Kaitlyn Sanchez is 85 y.o. female who presents today with a medical history as above. she is being seen in consultation for hyponatremia requested by [name], MD.  she is accompanied by her daughter.  History is obtained by reviewing the family as well as reviewing her medical records.  She was hospitalized for seizure-like activity early June 2021 when she was found to have hyponatremia as low as 119.  Subsequently improved to 136 on March 21, 2020.   -She reports 1 similar episode of hyponatremia 5 years ago where she was cared for in then  [facility]. -She does have history of hypothyroidism related to radioactive iodine ablation approximately at age 4.  She is on a stable dose of levothyroxine 112 mcg p.o. daily before breakfast with no complaints.  -She was advised on fluid restrictions related to her recent hyponatremia, however was not clear on how much.  -She denies craving for salt.  She denies any prior adrenal, pituitary, or pancreatic problems.  -She admits to have been consuming large quantities of liquid before her recent hospitalization.  Her work-up was digestive of SIADH with serum osmolality when her sodium was low at 119.  She denies any history of head injury, nor any history of  malignancy.  She has a steady weight.  She has hypertension controlled on 3 medications including amlodipine, Benicar, and labetalol.  Review of Systems  Constitutional: no recent weight gain/loss, no fatigue, no subjective hyperthermia, no subjective hypothermia Eyes: no blurry vision, no xerophthalmia ENT: no sore throat, no nodules palpated in throat, no dysphagia/odynophagia, no hoarseness Cardiovascular: no Chest Pain, no Shortness of Breath, no palpitations, no leg swelling Respiratory: no cough,  no shortness of breath Gastrointestinal: no Nausea/Vomiting/Diarhhea Musculoskeletal: no muscle/joint aches Skin: no rashes Neurological: no tremors, no numbness, no tingling, no dizziness Psychiatric: no depression, no anxiety  Objective:    Vitals with BMI 05/11/2020 03/19/2020 03/19/2020  Height 5' 2.5" - -  Weight 131 lbs 3 oz - -  BMI 59.5 - -  Systolic [phone]  Diastolic 73 54 56  Pulse 60 66 64    BP (!) 158/73   Pulse 60   Ht 5' 2.5" (1.588 m)   Wt 131 lb 3.2 oz (59.5 kg)   BMI 23.61 kg/m   Wt Readings from Last 3 Encounters:  05/11/20 131 lb 3.2 oz (59.5 kg)  03/17/20 130 lb (59 kg)  02/13/19 132 lb (59.9 kg)    Physical Exam  Constitutional:  Body mass index is 23.61 kg/m.,  not in acute distress, normal state of mind Eyes: PERRLA, EOMI, no exophthalmos ENT: moist mucous membranes, no gross thyromegaly, no gross cervical lymphadenopathy, + moist mucous membranes. Cardiovascular: normal precordial activity, Regular Rate and Rhythm, no Murmur/Rubs/Gallops Respiratory:  adequate breathing efforts, no gross chest deformity, Clear to auscultation bilaterally Gastrointestinal: abdomen soft, Non -tender, No distension, Bowel Sounds present, no gross organomegaly Musculoskeletal: no gross deformities, strength intact in all four extremities Skin: moist, warm, no rashes Neurological: no tremor with outstretched hands, Deep tendon reflexes normal in bilateral lower  extremities.  CMP ( most recent) CMP     Component Value Date/Time   NA 136 (A) 03/21/2020 0000   K 4.9 03/19/2020 0727   CL 105 03/19/2020 0727   CO2 25 03/19/2020 0727   GLUCOSE 93 03/19/2020 0727   BUN 13 03/21/2020 0000   CREATININE 0.7 03/21/2020 0000   CREATININE 0.45 03/19/2020 0727   CREATININE 0.62 08/19/2015 1237   CALCIUM 8.2 (L) 03/19/2020 0727   PROT 6.7 03/17/2020 1717   ALBUMIN 3.3 (L) 03/19/2020 0727   AST 22 03/17/2020 1717   ALT 23 03/17/2020 1717   ALKPHOS 76 03/17/2020 1717   BILITOT 0.5 03/17/2020 1717   GFRNONAA 78 03/21/2020 0000   GFRAA 90 03/21/2020 0000     Lab Results  Component Value Date   TSH 0.764 03/18/2020   TSH 12.285 (H) 02/24/2019      Assessment & Plan:   1. Hyponatremia/SIADH 2. Hypothyroidism following radioiodine therapy  - Kaitlyn Sanchez  is being seen at a kind request of [name], MD. - I have reviewed her available endocrine records and clinically evaluated the patient. - Based on these reviews, she has euvolemic hyponatremia, cardiac induced hypothyroidism. -Review of her hospital records indicate high likelihood of SIADH as a cause of her hyponatremia.  Her most recent labs show sodium 136, potassium 4.8. -Treatment approach was discussed in detail with her and her daughter.  Mainstay of therapy which is fluid restrictions were discussed in detail.  I advised her to limit her liquid intake to less than 800 mL a day.  She is allowed to consume half of the in free water for any other half in various liquid forms including electrolytes, juice, milk, coffee, etc.  -Patient with subacute hyponatremia from SIADH tend  to have a lower hypothalamic setpoint for osmostat, hands correction of sodium into the 140s is unnecessary. -They are doing well with sodium in the low normal range between 130-135 mmol/L.   - she will need a repeat labs including CMP, urine osmolality, serum normality, thyroid function tests before her next  visit in  5 weeks. -She is advised to call clinic if she started to have symptoms including lightheadedness, seizures.  -Regarding her hypothyroidism: She is advised to continue levothyroxine 112 mcg p.o. daily before breakfast.   - We discussed about the correct intake of her thyroid hormone, on empty stomach at fasting, with water, separated by at least 30 minutes from breakfast and other medications,  and separated by more than 4 hours from calcium, iron, multivitamins, acid reflux medications (PPIs). -Patient is made aware of the fact that thyroid hormone replacement is needed for life, dose to be adjusted by periodic monitoring of thyroid function tests.  - I did not initiate any new prescriptions today. - she is advised to maintain close follow up with [name], MD for primary care needs.   - Time spent with the patient: 45 minutes, of which >50% was spent in  counseling her about her hyponatremia/SIADH, hypothyroidism and the rest in obtaining information about her symptoms, reviewing her previous labs/studies ( including abstractions from other facilities),  evaluations, and treatments,  and developing a plan to confirm diagnosis and long term treatment based on the latest standards of care/guidelines; and documenting her care.  Kaitlyn Sanchez participated in the discussions, expressed understanding, and voiced agreement with the above plans.  All questions were answered to her satisfaction. she is encouraged to contact clinic should she have any questions or concerns prior to her return visit.  Follow up plan: Return in about 5 weeks (around 06/15/2020) for F/U with Pre-visit Labs, Fasting Labs  in AM B4 8.   Glade Lloyd, MD [facility] [address] [place], [place] [address] Phone: [phone]  Fax: [phone]     05/11/2020, 12:37 PM  This note was partially dictated with voice recognition software. Similar sounding  words can be transcribed inadequately or may not  be corrected upon review.

## 2020-05-23 ENCOUNTER — Other Ambulatory Visit: Payer: Self-pay | Admitting: *Deleted

## 2020-05-23 NOTE — Patient Outreach (Signed)
[facility]) Care Management  05/23/2020  Kaitlyn Sanchez 12-Apr-1935 [id number]   Monthly call placed to member to follow up on management of chronic medical conditions, no answer.  Unable to leave voice message, will follow up within the next 3-4 business days.  Valente David, [place], MSN [place] [phone]

## 2020-05-26 ENCOUNTER — Other Ambulatory Visit: Payer: Self-pay | Admitting: *Deleted

## 2020-05-26 NOTE — Patient Outreach (Addendum)
[facility] [facility]) Care Management  05/26/2020  MIRIAN CASCO April 21, 1935 [id number]   Outreach attempt #2, unsuccessful.  Monthly call placed to member to follow up on management of chronic medical conditions, no answer.  HIPAA compliant voice message left.  Will send unsuccessful outreach letter and follow up within the next 3-4 business days.    Update:  Call received back from member, state she has followed up with endocrinologist on 8/4.  Will have repeat labs done and another visit on 9/9.  She continues with daily fluid restrictions, 32 ounces a day.  She has broken this down throughout the day to have fluid with all meals and between meals to prevent drying out.  Discussed Covid precautions, state she is still being mindful when out in public, anticipating getting booster in November.  Denies any urgent concerns at this time, agrees to follow up within the next month.  Goals Addressed              This Visit's Progress   .  I want to get me sodium leve back to normal (pt-stated)   On track     [place] (see longitudinal plan of care for additional care plan information)  Current Barriers:  Marland Kitchen Knowledge Deficits related to electrolyte imbalance  Nurse Case Manager Clinical Goal(s):  Marland Kitchen Over the next 28 days, patient will meet with RN Care Manager to address fluid balance  . Over the next 31 days, patient will not experience hospital admission. Hospital Admissions in last 6 months = 1 . Over the next 28 days, patient will attend all scheduled medical appointments: AWV and endocrinology  Interventions:  . Inter-disciplinary care team collaboration (see longitudinal plan of care) . Provided education to patient re: sodium balance . Discussed plans with patient for ongoing care management follow up and provided patient with direct contact information for care management team . Reviewed scheduled/upcoming provider appointments including:   Patient Self Care  Activities:  . Attends all scheduled provider appointments . Performs ADL's independently . Calls provider office for new concerns or questions   Initial goal documentation         Valente David, RN, MSN [place] [phone]

## 2020-06-07 DIAGNOSIS — E063 Autoimmune thyroiditis: Secondary | ICD-10-CM | POA: Diagnosis not present

## 2020-06-07 DIAGNOSIS — E871 Hypo-osmolality and hyponatremia: Secondary | ICD-10-CM | POA: Diagnosis not present

## 2020-06-07 DIAGNOSIS — E7849 Other hyperlipidemia: Secondary | ICD-10-CM | POA: Diagnosis not present

## 2020-06-07 DIAGNOSIS — M810 Age-related osteoporosis without current pathological fracture: Secondary | ICD-10-CM | POA: Diagnosis not present

## 2020-06-07 DIAGNOSIS — I10 Essential (primary) hypertension: Secondary | ICD-10-CM | POA: Diagnosis not present

## 2020-06-08 LAB — COMPREHENSIVE METABOLIC PANEL
ALT: 16 IU/L (ref 0–32)
AST: 15 IU/L (ref 0–40)
Albumin/Globulin Ratio: 2 (ref 1.2–2.2)
Albumin: 4 g/dL (ref 3.6–4.6)
Alkaline Phosphatase: 92 IU/L (ref 48–121)
BUN/Creatinine Ratio: 25 (ref 12–28)
BUN: 19 mg/dL (ref 8–27)
Bilirubin Total: 0.3 mg/dL (ref 0.0–1.2)
CO2: 26 mmol/L (ref 20–29)
Calcium: 9.3 mg/dL (ref 8.7–10.3)
Chloride: 106 mmol/L (ref 96–106)
Creatinine, Ser: 0.77 mg/dL (ref 0.57–1.00)
GFR calc Af Amer: 83 mL/min/{1.73_m2} (ref >59)
GFR calc non Af Amer: 72 mL/min/{1.73_m2} (ref >59)
Globulin, Total: 2 g/dL (ref 1.5–4.5)
Glucose: 95 mg/dL (ref 65–99)
Potassium: 4.3 mmol/L (ref 3.5–5.2)
Sodium: 145 mmol/L — ABNORMAL HIGH (ref 134–144)
Total Protein: 6 g/dL (ref 6.0–8.5)

## 2020-06-08 LAB — TSH: TSH: 1.37 u[IU]/mL (ref 0.450–4.500)

## 2020-06-08 LAB — CORTISOL-AM, BLOOD: Cortisol - AM: 11.8 ug/dL (ref 6.2–19.4)

## 2020-06-08 LAB — OSMOLALITY: Osmolality Meas: 294 mOsmol/kg (ref 280–301)

## 2020-06-08 LAB — OSMOLALITY, URINE: Osmolality, Ur: 576 mOsmol/kg

## 2020-06-08 LAB — T4, FREE: Free T4: 1.74 ng/dL (ref 0.82–1.77)

## 2020-06-16 ENCOUNTER — Other Ambulatory Visit: Payer: Self-pay

## 2020-06-16 ENCOUNTER — Encounter: Payer: Self-pay | Admitting: "Endocrinology

## 2020-06-16 ENCOUNTER — Telehealth (INDEPENDENT_AMBULATORY_CARE_PROVIDER_SITE_OTHER): Payer: Medicare Other | Admitting: "Endocrinology

## 2020-06-16 VITALS — BP 126/57 | Ht 62.5 in | Wt 131.0 lb

## 2020-06-16 DIAGNOSIS — E890 Postprocedural hypothyroidism: Secondary | ICD-10-CM | POA: Diagnosis not present

## 2020-06-16 DIAGNOSIS — E871 Hypo-osmolality and hyponatremia: Secondary | ICD-10-CM

## 2020-06-16 NOTE — Progress Notes (Signed)
06/16/2020, 12:52 PM                                Endocrinology Telehealth Visit Follow up Note -During COVID -19 Pandemic  This visit type was conducted  via telephone due to national recommendations for restrictions regarding the COVID-19 Pandemic  in an effort to limit this patient's exposure and mitigate transmission of the corona virus.   I connected with Kaitlyn Sanchez on 06/16/2020   by telephone and verified that I am speaking with the correct person using two identifiers. Kaitlyn Sanchez, 05-Nov-1934. she has verbally consented to this visit.  I was in my office and patient was in her residence. No other persons were with me during the encounter. All issues noted in this document were discussed and addressed. The format was not optimal for physical exam.    Subjective:    Patient ID: Kaitlyn Sanchez, female    DOB: 1935-01-19, PCP [name], MD   Past Medical History:  Diagnosis Date  . Essential hypertension   . GERD (gastroesophageal reflux disease)   . History of cardiac catheterization 2013   Mild coronary atherosclerosis - [facility]  . Hyperlipidemia   . Hypothyroidism    Past Surgical History:  Procedure Laterality Date  . APPENDECTOMY    . CARPAL TUNNEL RELEASE    . CATARACT EXTRACTION W/PHACO Left 12/28/2013   Procedure: CATARACT EXTRACTION PHACO AND INTRAOCULAR LENS PLACEMENT (IOC);  Surgeon: Tonny Branch, MD;  Location: [facility];  Service: Ophthalmology;  Laterality: Left;  CDE 18.64  . CATARACT EXTRACTION W/PHACO Right 01/07/2014   Procedure: CATARACT EXTRACTION PHACO AND INTRAOCULAR LENS PLACEMENT (IOC);  Surgeon: Tonny Branch, MD;  Location: [facility];  Service: Ophthalmology;  Laterality: Right;  CDE:11.01  . COLONOSCOPY    . ESOPHAGOGASTRODUODENOSCOPY N/A 10/11/2014   RMR:non critical schatzkis ring/HH  . LUMBAR LAMINECTOMY     X 2  . TONSILLECTOMY     Social History   Socioeconomic History  . Marital status:  Widowed    Spouse name: Not on file  . Number of children: Not on file  . Years of education: Not on file  . Highest education level: Not on file  Occupational History  . 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 Smoking status: Never Smoker  . Smokeless tobacco: Never Used  Vaping Use  . Vaping Use: Never used  Substance and Sexual Activity  . Alcohol use: No  . Drug use: No  . Sexual activity: Not on file  Other Topics Concern  . Not on file  Social History Narrative  . Not on file   Social Determinants of Health   Financial Resource Strain:   . Difficulty of Paying Living Expenses: Not on file  Food Insecurity: No Food Insecurity  . Worried About Charity fundraiser in the Last Year: Never true  . Ran Out of Food in the Last Year: Never true  Transportation Needs: No Transportation Needs  . Lack of Transportation (Medical): No  . Lack of Transportation (Non-Medical): No  Physical Activity:   . Days of Exercise  per Week: Not on file  . Minutes of Exercise per Session: Not on file  Stress:   . Feeling of Stress : Not on file  Social Connections:   . Frequency of Communication with Friends and Family: Not on file  . Frequency of Social Gatherings with Friends and Family: Not on file  . Attends Religious Services: Not on file  . Active Member of Clubs or Organizations: Not on file  . Attends Archivist Meetings: Not on file  . Marital Status: Not on file   Family History  Problem Relation Age of Onset  . Colon cancer Other        no first degree relatives  . Renal cancer Father   . Heart attack Father   . Glaucoma Mother   . Heart failure Mother    Outpatient Encounter Medications as of 06/16/2020  Medication Sig  . amLODipine (NORVASC) 10 MG tablet Take 10 mg by mouth every evening.   Marland Kitchen aspirin EC 81 MG tablet Take 243 mg by mouth at bedtime.  . Calcium Carbonate-Vit D-Min (CALTRATE 600+D PLUS PO) Take 1 tablet  by mouth daily.  . Chelated Potassium 99 MG TABS Take 1 tablet by mouth daily.  . Cholecalciferol (VITAMIN D3) 125 MCG (5000 UT) CAPS Take 1 capsule by mouth daily.  Marland Kitchen docusate sodium (COLACE) 100 MG capsule Take 300 mg by mouth at bedtime.   . Flaxseed, Linseed, (FLAXSEED OIL) 1000 MG CAPS Take 1,000 mg by mouth daily.   Marland Kitchen glucosamine-chondroitin 500-400 MG tablet Take 1 tablet by mouth daily.  Marland Kitchen labetalol (NORMODYNE) 100 MG tablet Take 1 tablet by mouth 3 (three) times daily.  Marland Kitchen levothyroxine (SYNTHROID) 112 MCG tablet Take 112 mcg by mouth daily.  Marland Kitchen LORazepam (ATIVAN) 1 MG tablet Take 0.5 mg by mouth at bedtime as needed for anxiety.  . multivitamin-iron-minerals-folic acid (CENTRUM) chewable tablet Chew 1 tablet by mouth daily.  Marland Kitchen olmesartan (BENICAR) 40 MG tablet Take 40 mg by mouth daily.  . Omega 3 1200 MG CAPS Take 1,200 mg by mouth daily.   . vitamin B-12 (CYANOCOBALAMIN) 1000 MCG tablet Take 1,000 mcg by mouth daily.  . vitamin C (ASCORBIC ACID) 500 MG tablet Take 500 mg by mouth daily.  Marland Kitchen zinc gluconate 50 MG tablet Take 50 mg by mouth daily.  . [DISCONTINUED] Biotin w/ Vitamins C & E (HAIR/SKIN/NAILS PO) Take 1 tablet by mouth daily.    No facility-administered encounter medications on file as of 06/16/2020.   ALLERGIES: Allergies  Allergen Reactions  . Levaquin [Levofloxacin] Other (See Comments)    Seizures    VACCINATION STATUS:  There is no immunization history on file for this patient.  HPI Kaitlyn Sanchez is 85 y.o. female who presents today with a medical history as above. she is being seen in follow-up after she was seen consultation for hyponatremia requested by [name], MD.  she is being assisted by her daughter during this telephone visit.    -She recently had hyponatremia as low as 119, currently on fluid restrictions.  Her previsit labs show corrected sodium of 145.  She is consuming 30 to 35 ounces of fluid every day.     -She reports 1 similar episode  of hyponatremia 5 years ago where she was cared for in then  [facility]. -She does have history of hypothyroidism related to radioactive iodine ablation approximately at age 15.  She is on a stable dose of levothyroxine 112 mcg p.o. daily before  breakfast with no complaints.  Her previsit thyroid function tests are consistent with appropriate replacement.   -She denies craving for salt.  She denies any prior adrenal, pituitary, or pancreatic problems.   She denies any history of head injury, nor any history of malignancy.  She has a steady weight.  She has hypertension controlled on 3 medications including amlodipine, Benicar, and labetalol.  Review of Systems Limited as above.  Objective:    Vitals with BMI 06/16/2020 05/11/2020 03/19/2020  Height 5' 2.5" 5' 2.5" -  Weight 131 lbs 131 lbs 3 oz -  BMI 09.81 19.1 -  Systolic 478 295 621  Diastolic 57 73 54  Pulse - 60 66    BP (!) 126/57   Ht 5' 2.5" (1.588 m)   Wt 131 lb (59.4 kg)   BMI 23.58 kg/m   Wt Readings from Last 3 Encounters:  06/16/20 131 lb (59.4 kg)  05/11/20 131 lb 3.2 oz (59.5 kg)  03/17/20 130 lb (59 kg)    Physical Exam   CMP ( most recent) CMP     Component Value Date/Time   NA 145 (H) 06/07/2020 0833   K 4.3 06/07/2020 0833   CL 106 06/07/2020 0833   CO2 26 06/07/2020 0833   GLUCOSE 95 06/07/2020 0833   GLUCOSE 93 03/19/2020 0727   BUN 19 06/07/2020 0833   CREATININE 0.77 06/07/2020 0833   CREATININE 0.62 08/19/2015 1237   CALCIUM 9.3 06/07/2020 0833   PROT 6.0 06/07/2020 0833   ALBUMIN 4.0 06/07/2020 0833   AST 15 06/07/2020 0833   ALT 16 06/07/2020 0833   ALKPHOS 92 06/07/2020 0833   BILITOT 0.3 06/07/2020 0833   GFRNONAA 72 06/07/2020 0833   GFRAA 83 06/07/2020 0833     Lab Results  Component Value Date   TSH 1.370 06/07/2020   TSH 0.764 03/18/2020   TSH 12.285 (H) 02/24/2019   FREET4 1.74 06/07/2020      Assessment & Plan:   1. Hyponatremia/SIADH 2. Hypothyroidism  following radioiodine therapy  -Her previsit CMP showed sodium stable at 145.  Serum and urine osmolality is appropriate at this time. -Review of her current and prior hospital records indicate high likelihood of SIADH as a cause of her hyponatremia.  -Treatment approach was discussed in detail with her and her daughter.  Mainstay of therapy which is fluid restrictions were discussed in detail.  I advised her to limit her liquid intake to less than 800 mL a day.  She is allowed to consume half of her fluid in the form of free water, and  other half in various liquid forms including electrolytes, juice, milk, coffee, etc.  -Patients with subacute hyponatremia from SIADH tend  to have a lower hypothalamic setpoint for osmostat, hence,  correction of sodium into the 140s is unnecessary.  -They are doing well with sodium in the low normal range between 130-135 mmol/L.  She is allowed to consume ten more  ounce of fluid daily during this warm season.  -She is advised to call clinic if she started to have symptoms including lightheadedness, seizures.  -Regarding her hypothyroidism: She is on a stable dose of levothyroxine 112 mcg p.o. daily before breakfast.  Her previsit labs are consistent with appropriate replacement.     - We discussed about the correct intake of her thyroid hormone, on empty stomach at fasting, with water, separated by at least 30 minutes from breakfast and other medications,  and separated by more than 4 hours  from calcium, iron, multivitamins, acid reflux medications (PPIs). -Patient is made aware of the fact that thyroid hormone replacement is needed for life, dose to be adjusted by periodic monitoring of thyroid function tests.  - I did not initiate any new prescriptions today. - she is advised to maintain close follow up with [name], MD for primary care needs.      - Time spent on this patient care encounter:  30 minutes of which 50% was spent in  counseling and the  rest reviewing  her current and  previous labs / studies and medications  doses and developing a plan for long term care. Kaitlyn Sanchez  participated in the discussions, expressed understanding, and voiced agreement with the above plans.  All questions were answered to her satisfaction. she is encouraged to contact clinic should she have any questions or concerns prior to her return visit.   Follow up plan: Return in about 6 months (around 12/14/2020) for F/U with Pre-visit Labs.   Glade Lloyd, MD [facility] [address] [place], [place] [address] Phone: [phone]  Fax: [phone]     06/16/2020, 12:52 PM  This note was partially dictated with voice recognition software. Similar sounding words can be transcribed inadequately or may not  be corrected upon review.

## 2020-06-23 ENCOUNTER — Other Ambulatory Visit: Payer: Self-pay | Admitting: *Deleted

## 2020-06-23 NOTE — Patient Outreach (Signed)
[facility]) Care Management  06/23/2020  LAYLEE SCHOOLEY [birth year] [id number]   Call placed to member to follow up on management of chronic medical conditions.  Noted she had follow up with endocrinology on 9/9, no issues identified.  She will follow up in 6 months, aware to call office with any concerns.  She confirms that she will remain on fluid restrictions, state she is still measuring out all of her servings.  Report she is still trying to gain 100% strength back in her legs, some days are better than others.    No further complex needs identified, discussed ongoing disease management for HTN, she agrees.  Will place referral to health coach and notify PCP of transition.    Goals Addressed              This Visit's Progress     COMPLETED: I want to get me sodium leve back to normal (pt-stated)        CARE PLAN ENTRY (see longitudinal plan of care for additional care plan information)  Current Barriers:   Knowledge Deficits related to electrolyte imbalance  Nurse Case Manager Clinical Goal(s):   Over the next 28 days, patient will meet with RN Care Manager to address fluid balance   Over the next 31 days, patient will not experience hospital admission. Hospital Admissions in last 6 months = 1  Over the next 28 days, patient will attend all scheduled medical appointments: AWV and endocrinology  Interventions:   Inter-disciplinary care team collaboration (see longitudinal plan of care)  Provided education to patient re: sodium balance  Discussed plans with patient for ongoing care management follow up and provided patient with direct contact information for care management team  Reviewed scheduled/upcoming provider appointments including:   Patient Self Care Activities:   Attends all scheduled provider appointments  Performs ADL's independently  Calls provider office for new concerns or questions          Valente David, RN, MSN [place] Manager [phone]

## 2020-06-24 ENCOUNTER — Other Ambulatory Visit: Payer: Self-pay | Admitting: *Deleted

## 2020-07-07 DIAGNOSIS — I10 Essential (primary) hypertension: Secondary | ICD-10-CM | POA: Diagnosis not present

## 2020-07-07 DIAGNOSIS — E7849 Other hyperlipidemia: Secondary | ICD-10-CM | POA: Diagnosis not present

## 2020-07-07 DIAGNOSIS — E063 Autoimmune thyroiditis: Secondary | ICD-10-CM | POA: Diagnosis not present

## 2020-07-07 DIAGNOSIS — M810 Age-related osteoporosis without current pathological fracture: Secondary | ICD-10-CM | POA: Diagnosis not present

## 2020-07-20 ENCOUNTER — Other Ambulatory Visit: Payer: Self-pay | Admitting: *Deleted

## 2020-07-20 NOTE — Patient Outreach (Signed)
[facility]) Care Management  07/20/2020  Kaitlyn Sanchez 02-17-1935 [id number]  Unsuccessful outreach attempt made to patient. RN Health Coach left HIPAA compliant voicemail message along with her contact information.  Plan: RN Health Coach will call patient within the month of November.  [name] RN, BSN [place] [phone] [email or web address]

## 2020-08-06 DIAGNOSIS — E063 Autoimmune thyroiditis: Secondary | ICD-10-CM | POA: Diagnosis not present

## 2020-08-06 DIAGNOSIS — I10 Essential (primary) hypertension: Secondary | ICD-10-CM | POA: Diagnosis not present

## 2020-08-06 DIAGNOSIS — M810 Age-related osteoporosis without current pathological fracture: Secondary | ICD-10-CM | POA: Diagnosis not present

## 2020-08-06 DIAGNOSIS — E7849 Other hyperlipidemia: Secondary | ICD-10-CM | POA: Diagnosis not present

## 2020-08-09 NOTE — Progress Notes (Signed)
[facility] Note  08/10/2020     CHIEF COMPLAINT Patient presents for Retina Follow Up   HISTORY OF PRESENT ILLNESS: Kaitlyn Sanchez is a 85 y.o. female who presents to the clinic today for:   HPI    Retina Follow Up    Patient presents with  Other.  In both eyes.  I, the attending physician,  performed the HPI with the patient and updated documentation appropriately.          Comments    6 month follow up HTN Retinopathy OU- Patient states vision is stable, no changes since last visit.  Pt has an appt with Dr. Jorja Loa in 2 weeks for check up. Using ATs and allergy drops prn.        Last edited by Bernarda Caffey, MD on 08/10/2020  2:03 PM. (History)    pt feels like vision has dropped slightly, pt is on medication for blood pressure and states her BP has been good for the past 6 months, pt uses Refresh for dryness   Referring physician: [name], MD [address] [place],  [place] [address]  HISTORICAL INFORMATION:   Selected notes from the MEDICAL RECORD NUMBER Referred by Dr. Madelin Headings for concern of diabetic retinopathy LEE: 03.11.20 Jerilynn Mages. 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 (Ophthalmic Drugs)   No current facility-administered medications for this visit. (Ophthalmic Drugs)   Current Outpatient Medications (Other)  Medication Sig  . amLODipine (NORVASC) 10 MG tablet Take 10 mg by mouth every evening.   Marland Kitchen aspirin EC 81 MG tablet Take 243 mg by mouth at bedtime.  . Calcium Carbonate-Vit D-Min (CALTRATE 600+D PLUS PO) Take 1 tablet by mouth daily.  . Chelated Potassium 99 MG TABS Take 1 tablet by mouth daily.  . Cholecalciferol (VITAMIN D3) 125 MCG (5000 UT) CAPS Take 1 capsule by mouth daily.  Marland Kitchen docusate sodium (COLACE) 100 MG capsule Take 300 mg by mouth at  bedtime.   . Flaxseed, Linseed, (FLAXSEED OIL) 1000 MG CAPS Take 1,000 mg by mouth daily.   Marland Kitchen glucosamine-chondroitin 500-400 MG tablet Take 1 tablet by mouth daily.  Marland Kitchen labetalol (NORMODYNE) 100 MG tablet Take 1 tablet by mouth 3 (three) times daily.  Marland Kitchen levothyroxine (SYNTHROID) 112 MCG tablet Take 112 mcg by mouth daily.  Marland Kitchen LORazepam (ATIVAN) 1 MG tablet Take 0.5 mg by mouth at bedtime as needed for anxiety.  . multivitamin-iron-minerals-folic acid (CENTRUM) chewable tablet Chew 1 tablet by mouth daily.  Marland Kitchen olmesartan (BENICAR) 40 MG tablet Take 40 mg by mouth daily.  . Omega 3 1200 MG CAPS Take 1,200 mg by mouth daily.   . vitamin B-12 (CYANOCOBALAMIN) 1000 MCG tablet Take 1,000 mcg by mouth daily.  . vitamin C (ASCORBIC ACID) 500 MG tablet Take 500 mg by mouth daily.  Marland Kitchen zinc gluconate 50 MG tablet Take 50 mg by mouth daily.   No current facility-administered medications for this visit. (Other)      REVIEW OF SYSTEMS: ROS    Positive for: Gastrointestinal, Neurological, Endocrine, Eyes   Negative for: Constitutional, Skin, Genitourinary, Musculoskeletal, HENT, Cardiovascular, Respiratory, Psychiatric, Allergic/Imm, Heme/Lymph   Last edited by Leonie [name], COA on 08/10/2020  1:33 PM. (History)       ALLERGIES Allergies  Allergen Reactions  . Levaquin [Levofloxacin] Other (See Comments)  Seizures    PAST MEDICAL HISTORY Past Medical History:  Diagnosis Date  . Essential hypertension   . GERD (gastroesophageal reflux disease)   . History of cardiac catheterization 2013   Mild coronary atherosclerosis - [facility]  . Hyperlipidemia   . Hypothyroidism    Past Surgical History:  Procedure Laterality Date  . APPENDECTOMY    . CARPAL TUNNEL RELEASE    . CATARACT EXTRACTION W/PHACO Left 12/28/2013   Procedure: CATARACT EXTRACTION PHACO AND INTRAOCULAR LENS PLACEMENT (IOC);  Surgeon: Tonny Branch, MD;  Location: [facility];  Service: Ophthalmology;  Laterality: Left;  CDE 18.64  .  CATARACT EXTRACTION W/PHACO Right 01/07/2014   Procedure: CATARACT EXTRACTION PHACO AND INTRAOCULAR LENS PLACEMENT (IOC);  Surgeon: Tonny Branch, MD;  Location: [facility];  Service: Ophthalmology;  Laterality: Right;  CDE:11.01  . COLONOSCOPY    . ESOPHAGOGASTRODUODENOSCOPY N/A 10/11/2014   RMR:non critical schatzkis ring/HH  . LUMBAR LAMINECTOMY     X 2  . TONSILLECTOMY      FAMILY HISTORY Family History  Problem Relation Age of Onset  . Colon cancer Other        no first degree relatives  . Renal cancer Father   . Heart attack Father   . Glaucoma Mother   . Heart failure Mother     SOCIAL HISTORY Social History   Tobacco Use  . Smoking status: Never Smoker  . Smokeless tobacco: Never Used  Vaping Use  . Vaping Use: Never used  Substance Use Topics  . Alcohol use: No  . Drug use: No         OPHTHALMIC EXAM:  Base Eye Exam    Visual Acuity (Snellen - Linear)      Right Left   Dist cc 20/25 -2 20/20 -2   Dist ph cc 20/20 -1 NI   Correction: Glasses       Tonometry (Tonopen, 1:39 PM)      Right Left   Pressure 16 15       Pupils      Dark Light Shape React APD   Right 3 2 Round Brisk None   Left 3 2 Round Brisk None       Visual Fields (Counting fingers)      Left Right    Full Full       Extraocular Movement      Right Left    Full Full       Neuro/Psych    Oriented x3: Yes   Mood/Affect: Normal       Dilation    Both eyes: 1.0% Mydriacyl, 2.5% Phenylephrine @ 1:39 PM        Slit Lamp and Fundus Exam    Slit Lamp Exam      Right Left   Lids/Lashes Dermatochalasis - upper lid, Ptosis Dermatochalasis - upper lid, Ptosis   Conjunctiva/Sclera mild temporal Pinguecula mild nasal temporal Pinguecula   Cornea Arcus, 2+Punctate epithelial erosions, Well healed cataract wounds, endo pigment Arcus, 1-2+ inferior Punctate epithelial erosions, Well healed cataract wounds, mild endo pigment    Anterior Chamber Deep and quiet Deep and quiet   Iris Round  and dilated Round and moderately dilated to 4.76m   Lens PC IOL in good position with open PC PC IOL in good position with open PC   Vitreous Vitreous syneresis, Posterior vitreous detachment mild Vitreous syneresis, Posterior vitreous detachment, vitreous condensations inferiorly       Fundus Exam      Right Left  Disc Pink and Sharp Pink and Sharp   C/D Ratio 0.3 0.3   Macula Blunted foveal reflex, trace cystic changes/edema -- increased, focal cluster of MA/IRH temporal fovea Flat, good foveal reflex, mild RPE mottling, trace Epiretinal membrane, No heme or edema   Vessels Vascular attenuation, mild tortuosity Vascular attenuation, mild tortuosity   Periphery Attached, no heme Attached, no heme        Refraction    Wearing Rx      Sphere Cylinder Add   Right -0.25 Sphere +2.25   Left -0.25 Sphere +2.25   Type: PAL          IMAGING AND PROCEDURES  Imaging and Procedures for $RemoveBefore'@TODAY'avlvRwOoYnVfm$ @  OCT, Retina - OU - Both Eyes       Right Eye Quality was good. Central Foveal Thickness: 299. Progression has worsened. Findings include no SRF, intraretinal fluid, intraretinal hyper-reflective material, abnormal foveal contour (interval increase in cystic changes temporal fovea).   Left Eye Quality was good. Central Foveal Thickness: 266. Progression has been stable. Findings include normal foveal contour, no IRF, no SRF.   Notes *Images captured and stored on drive  Diagnosis / Impression:  OD: NFP, no SRF, interval increase in cystic changes temporal fovea OS: NFP, no IRF/SRF   Clinical management:  See below  Abbreviations: NFP - Normal foveal profile. CME - cystoid macular edema. PED - pigment epithelial detachment. IRF - intraretinal fluid. SRF - subretinal fluid. EZ - ellipsoid zone. ERM - epiretinal membrane. ORA - outer retinal atrophy. ORT - outer retinal tubulation. SRHM - subretinal hyper-reflective material                 ASSESSMENT/PLAN:    ICD-10-CM   1.  Hypertensive retinopathy of both eyes  H35.033   2. Essential hypertension  I10   3. Retinal edema  H35.81 OCT, Retina - OU - Both Eyes  4. Pseudophakia of both eyes  Z96.1     1-3. Hypertensive retinopathy OU  - focal intraretinal hemes in macula OU (OD > OS) -- persistent and slightly worse OD today  - OCT shows interval increase in cystic changes OD, temporal fovea  - BCVA remains 20/20 OU  - FA (05.05.21) shows focal MA in macula with late leakage OU    - BP in office measured 167/72 R arm and 147/67 L arm (03.05.21); 147/67 (11.06.2020); 169/73 at prior visit 07.06.2020  - meds adjusted by PCP and cardiology  - discussed importance of tight BP control  - monitor for now  - f/u 4 months, sooner prn, DFE/OCT  4. Pseudophakia OU  - s/p CE/IOL OU - Dr. Tonny Branch (2015)  - beautiful surgeries, doing well  - monitor    Ophthalmic Meds Ordered this visit:  No orders of the defined types were placed in this encounter.      Return in about 4 months (around 12/08/2020) for f/u HTN ret OU, DFE, OCT.  There are no Patient Instructions on file for this visit.   Explained the diagnoses, plan, and follow up with the patient and they expressed understanding.  Patient expressed understanding of the importance of proper follow up care.   This document serves as a record of services personally performed by Gardiner Sleeper, MD, PhD. It was created on their behalf by Roselee Nova, COMT. The creation of this record is the provider's dictation and/or activities during the visit.  Electronically signed by: Roselee Nova, COMT 08/14/20 10:16 AM   This document serves  as a record of services personally performed by Gardiner Sleeper, MD, PhD. It was created on their behalf by San Jetty. Owens Shark, OA an ophthalmic technician. The creation of this record is the provider's dictation and/or activities during the visit.    Electronically signed by: San Jetty. Owens Shark, [place] 11.03.2021 10:16 AM  Gardiner Sleeper,  M.D., Ph.D. Diseases & Surgery of the Retina and Vitreous [facility]  I have reviewed the above documentation for accuracy and completeness, and I agree with the above. Gardiner Sleeper, M.D., Ph.D. 08/14/20 10:16 AM   Abbreviations: M myopia (nearsighted); A astigmatism; H hyperopia (farsighted); P presbyopia; Mrx spectacle prescription;  CTL contact lenses; OD right eye; OS left eye; OU both eyes  XT exotropia; ET esotropia; PEK punctate epithelial keratitis; PEE punctate epithelial erosions; DES dry eye syndrome; MGD meibomian gland dysfunction; ATs artificial tears; PFAT's preservative free artificial tears; Russell nuclear sclerotic cataract; PSC posterior subcapsular cataract; ERM epi-retinal membrane; PVD posterior vitreous detachment; RD retinal detachment; DM diabetes mellitus; DR diabetic retinopathy; NPDR non-proliferative diabetic retinopathy; PDR proliferative diabetic retinopathy; CSME clinically significant macular edema; DME diabetic macular edema; dbh dot blot hemorrhages; CWS cotton wool spot; POAG primary open angle glaucoma; C/D cup-to-disc ratio; HVF humphrey visual field; GVF goldmann visual field; OCT optical coherence tomography; IOP intraocular pressure; BRVO Branch retinal vein occlusion; CRVO central retinal vein occlusion; CRAO central retinal artery occlusion; BRAO branch retinal artery occlusion; RT retinal tear; SB scleral buckle; PPV pars plana vitrectomy; VH Vitreous hemorrhage; PRP panretinal laser photocoagulation; IVK intravitreal kenalog; VMT vitreomacular traction; MH Macular hole;  NVD neovascularization of the disc; NVE neovascularization elsewhere; AREDS age related eye disease study; ARMD age related macular degeneration; POAG primary open angle glaucoma; EBMD epithelial/anterior basement membrane dystrophy; ACIOL anterior chamber intraocular lens; IOL intraocular lens; PCIOL posterior chamber intraocular lens; Phaco/IOL phacoemulsification with  intraocular lens placement; Ashley photorefractive keratectomy; LASIK laser assisted in situ keratomileusis; HTN hypertension; DM diabetes mellitus; COPD chronic obstructive pulmonary disease

## 2020-08-10 ENCOUNTER — Other Ambulatory Visit: Payer: Self-pay

## 2020-08-10 ENCOUNTER — Ambulatory Visit (INDEPENDENT_AMBULATORY_CARE_PROVIDER_SITE_OTHER): Payer: Medicare Other | Admitting: Ophthalmology

## 2020-08-10 ENCOUNTER — Encounter (INDEPENDENT_AMBULATORY_CARE_PROVIDER_SITE_OTHER): Payer: Self-pay | Admitting: Ophthalmology

## 2020-08-10 VITALS — BP 160/62 | HR 62

## 2020-08-10 DIAGNOSIS — H35033 Hypertensive retinopathy, bilateral: Secondary | ICD-10-CM

## 2020-08-10 DIAGNOSIS — H3581 Retinal edema: Secondary | ICD-10-CM

## 2020-08-10 DIAGNOSIS — I10 Essential (primary) hypertension: Secondary | ICD-10-CM | POA: Diagnosis not present

## 2020-08-10 DIAGNOSIS — Z961 Presence of intraocular lens: Secondary | ICD-10-CM

## 2020-08-12 ENCOUNTER — Encounter (INDEPENDENT_AMBULATORY_CARE_PROVIDER_SITE_OTHER): Payer: Medicare Other | Admitting: Ophthalmology

## 2020-08-23 ENCOUNTER — Other Ambulatory Visit: Payer: Self-pay | Admitting: *Deleted

## 2020-08-23 NOTE — Patient Outreach (Signed)
[facility]) Care Management  08/23/2020  [name] [birth year] [id number]  Unsuccessful outreach attempt made to patient. RN Health Coach left HIPAA compliant voicemail message along with her contact information.  Plan: RN Health Coach will call patient within the month of December.  Kaitlyn Loron RN, BSN [place] [phone] [email or web address]

## 2020-08-24 ENCOUNTER — Ambulatory Visit: Payer: Self-pay | Admitting: *Deleted

## 2020-09-06 DIAGNOSIS — I10 Essential (primary) hypertension: Secondary | ICD-10-CM | POA: Diagnosis not present

## 2020-09-06 DIAGNOSIS — E7849 Other hyperlipidemia: Secondary | ICD-10-CM | POA: Diagnosis not present

## 2020-09-06 DIAGNOSIS — M810 Age-related osteoporosis without current pathological fracture: Secondary | ICD-10-CM | POA: Diagnosis not present

## 2020-09-06 DIAGNOSIS — E063 Autoimmune thyroiditis: Secondary | ICD-10-CM | POA: Diagnosis not present

## 2020-09-28 ENCOUNTER — Other Ambulatory Visit: Payer: Self-pay | Admitting: *Deleted

## 2020-09-28 NOTE — Patient Outreach (Signed)
[place] [facility]) Care Management  09/28/2020  Kaitlyn Sanchez Sep 29, 1935 [id number]  Unsuccessful outreach attempt made to patient. RN Health Coach left HIPAA compliant voicemail message along with her contact information.  Plan: RN Health Coach will call patient within the month of January.  [name] RN, BSN [place] [phone] [name]

## 2020-11-02 ENCOUNTER — Other Ambulatory Visit: Payer: Self-pay | Admitting: *Deleted

## 2020-11-02 NOTE — Patient Outreach (Signed)
[name]) Care Management  11/02/2020  Kaitlyn Sanchez [birth year] [id number]  Nurse received a message from [place] that the patient contacted [facility] to request that her appointment on 11/03/20 be rescheduled to another day.   Plan: RN Health Coach will call patient within the month of February.  [name] RN, BSN [place] [phone] [email or web address]

## 2020-11-03 ENCOUNTER — Ambulatory Visit: Payer: Self-pay | Admitting: *Deleted

## 2020-11-17 ENCOUNTER — Other Ambulatory Visit: Payer: Self-pay | Admitting: *Deleted

## 2020-11-17 ENCOUNTER — Encounter: Payer: Self-pay | Admitting: *Deleted

## 2020-11-17 NOTE — Patient Outreach (Signed)
[place] [facility]) Care Management  [place]  11/17/2020   Kaitlyn Sanchez 1935/03/15 [id number]  Subjective: Successful telephone outreach call to patient. HIPAA identifiers obtained. Patient reports she is doing well. Patient states she has gained most of her strength back after her hospitalization and explains she is independent with her ADLs and IADLs. Patient states that her [place] and her daughters and grandchildren and very supportive. Patient fell in December and states she did have several bruises and scratches but reports no other concerning injury. Patient explains that she was wearing old shoes that did not have any tread and she slipped adding that she does have a steady gait typically.  Patient states that her B/P has been good recently with ranges of 357-017'B systolic and usually in the 93'J diastolic. She is monitoring her B/P daily and will bring her monitor to her upcoming PCP visit. Patient does aerobic exercises at home routinely to maintain her strength and for her overall health. Patient did not have any further questions or concerns today.    Encounter Medications:  Outpatient Encounter Medications as of 11/17/2020  Medication Sig Note  . amLODipine (NORVASC) 10 MG tablet Take 10 mg by mouth every evening.  03/17/2020: Has not had todays dose  . aspirin EC 81 MG tablet Take 243 mg by mouth at bedtime.   . Calcium Carbonate-Vit D-Min (CALTRATE 600+D PLUS PO) Take 1 tablet by mouth daily.   . Chelated Potassium 99 MG TABS Take 1 tablet by mouth daily.   . Cholecalciferol (VITAMIN D3) 125 MCG (5000 UT) CAPS Take 1 capsule by mouth daily.   Marland Kitchen docusate sodium (COLACE) 100 MG capsule Take 300 mg by mouth at bedtime.    . Flaxseed, Linseed, (FLAXSEED OIL) 1000 MG CAPS Take 1,000 mg by mouth daily.    Marland Kitchen glucosamine-chondroitin 500-400 MG tablet Take 1 tablet by mouth daily.   Marland Kitchen labetalol (NORMODYNE) 100 MG tablet  Take 1 tablet by mouth 3 (three) times daily. 03/17/2020: Patient has not has last dose for today  . levothyroxine (SYNTHROID) 112 MCG tablet Take 112 mcg by mouth daily.   Marland Kitchen LORazepam (ATIVAN) 1 MG tablet Take 0.5 mg by mouth at bedtime as needed for anxiety.   . multivitamin-iron-minerals-folic acid (CENTRUM) chewable tablet Chew 1 tablet by mouth daily.   Marland Kitchen olmesartan (BENICAR) 40 MG tablet Take 40 mg by mouth daily.   . Omega 3 1200 MG CAPS Take 1,200 mg by mouth daily.    . vitamin B-12 (CYANOCOBALAMIN) 1000 MCG tablet Take 1,000 mcg by mouth daily.   . vitamin C (ASCORBIC ACID) 500 MG tablet Take 500 mg by mouth daily.   Marland Kitchen zinc gluconate 50 MG tablet Take 50 mg by mouth daily.    No facility-administered encounter medications on file as of 11/17/2020.    Functional Status:  In your present state of health, do you have any difficulty performing the following activities: 03/31/2020 03/18/2020  Hearing? N N  Vision? N N  Difficulty concentrating or making decisions? N N  Walking or climbing stairs? N N  Dressing or bathing? N N  Doing errands, shopping? N N  Preparing Food and eating ? N -  Using the Toilet? N -  In the past six months, have you accidently leaked urine? N -  Do you have problems with loss of bowel control? N -  Managing your Medications? N -  Managing your Finances?  N -  Housekeeping or managing your Housekeeping? N -  Some recent data might be hidden    Fall/Depression Screening: Fall Risk  11/17/2020 03/31/2020  Falls in the past year? 1 0  Number falls in past yr: 0 0  Injury with Fall? 1 0  Risk for fall due to : Impaired mobility;Impaired balance/gait;History of fall(s) -  Follow up Falls evaluation completed;Education provided;Falls prevention discussed -   PHQ 2/9 Scores 11/17/2020 03/31/2020  PHQ - 2 Score 0 1    Assessment:  Goals Addressed            This Visit's Progress   . THN Patient will monitor her B/P daily and record the values within  the next 90 days       Timeframe:  Long-Range Goal Priority:  High Start Date:  11/17/20                           Expected End Date: 11/17/21                      Follow Up Date 03/06/21    - check blood pressure daily - write blood pressure results in a log or diary  -Encouraged patient to continue to do aerobic exercise routinely  Why is this important?    You won't feel high blood pressure, but it can still hurt your blood vessels.   High blood pressure can cause heart or kidney problems. It can also cause a stroke.   Making lifestyle changes like losing a little weight or eating less salt will help.   Checking your blood pressure at home and at different times of the day can help to control blood pressure.   If the doctor prescribes medicine remember to take it the way the doctor ordered.   Call the office if you cannot afford the medicine or if there are questions about it.     Notes: Patient reports taking her B/P daily. Nurse encouraged patient to write her daily values down to be able to follow her B/P trends closely. Nurse will send patient a calendar booklet to record her values. Patient states she does aerobic exercises routinely at home for about 30 minutes. Nurse shared [employer] adult exercise TV program information with patient.       Plan: RN Health Coach will send PCP a barrier letter and today's assessment note, will send patient a calendar booklet to record her B/P values, and will call patient within the month of April. Follow-up:  Patient agrees to Care Plan and Follow-up.  [name] RN, BSN [place] [phone] [name]

## 2020-11-17 NOTE — Patient Instructions (Signed)
Goals Addressed            This Visit's Progress   . THN Patient will monitor her B/P daily and record the values within the next 90 days       Timeframe:  Long-Range Goal Priority:  High Start Date:  11/17/20                           Expected End Date: 11/17/21                      Follow Up Date 03/06/21    - check blood pressure daily - write blood pressure results in a log or diary  -Encouraged patient to continue to do aerobic exercise routinely  Why is this important?    You won't feel high blood pressure, but it can still hurt your blood vessels.   High blood pressure can cause heart or kidney problems. It can also cause a stroke.   Making lifestyle changes like losing a little weight or eating less salt will help.   Checking your blood pressure at home and at different times of the day can help to control blood pressure.   If the doctor prescribes medicine remember to take it the way the doctor ordered.   Call the office if you cannot afford the medicine or if there are questions about it.     Notes: Patient reports taking her B/P daily. Nurse encouraged patient to write her daily values down to be able to follow her B/P trends closely. Nurse will send patient a calendar booklet to record her values. Patient states she does aerobic exercises routinely at home for about 30 minutes. Nurse shared [employer] adult exercise TV program information with patient.

## 2020-12-05 NOTE — Progress Notes (Signed)
[facility] Note  12/07/2020     CHIEF COMPLAINT Patient presents for Retina Follow Up   HISTORY OF PRESENT ILLNESS: Kaitlyn Sanchez is a 86 y.o. female who presents to the clinic today for:   HPI    Retina Follow Up    Patient presents with  Other.  In both eyes.  Severity is mild.  Duration of 4 months.  Since onset it is stable.  I, the attending physician,  performed the HPI with the patient and updated documentation appropriately.          Comments    Pt states vision is doing well, needs more light at night to see, no fol or eye pain, but has the same floaters she has had for years, uses OTC At's for dryness       Last edited by Bernarda Caffey, MD on 12/07/2020  1:42 PM. (History)      Referring physician: [name], MD [address] [place],  [place] [address]  HISTORICAL INFORMATION:   Selected notes from the MEDICAL RECORD NUMBER Referred by Dr. Madelin Headings for concern of diabetic retinopathy LEE: 03.11.20 (M. 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 (Ophthalmic Drugs)   No current facility-administered medications for this visit. (Ophthalmic Drugs)   Current Outpatient Medications (Other)  Medication Sig  . amLODipine (NORVASC) 10 MG tablet Take 10 mg by mouth every evening.   Marland Kitchen aspirin EC 81 MG tablet Take 243 mg by mouth at bedtime.  . Calcium Carbonate-Vit D-Min (CALTRATE 600+D PLUS PO) Take 1 tablet by mouth daily.  . Chelated Potassium 99 MG TABS Take 1 tablet by mouth daily.  . Cholecalciferol (VITAMIN D3) 125 MCG (5000 UT) CAPS Take 1 capsule by mouth daily.  Marland Kitchen docusate sodium (COLACE) 100 MG capsule Take 300 mg by mouth at bedtime.   . Flaxseed, Linseed, (FLAXSEED OIL) 1000 MG CAPS Take 1,000 mg by mouth daily.   Marland Kitchen glucosamine-chondroitin 500-400 MG  tablet Take 1 tablet by mouth daily.  Marland Kitchen labetalol (NORMODYNE) 100 MG tablet Take 1 tablet by mouth 3 (three) times daily.  Marland Kitchen levothyroxine (SYNTHROID) 112 MCG tablet Take 112 mcg by mouth daily.  Marland Kitchen LORazepam (ATIVAN) 1 MG tablet Take 0.5 mg by mouth at bedtime as needed for anxiety.  . multivitamin-iron-minerals-folic acid (CENTRUM) chewable tablet Chew 1 tablet by mouth daily.  Marland Kitchen olmesartan (BENICAR) 40 MG tablet Take 40 mg by mouth daily.  . Omega 3 1200 MG CAPS Take 1,200 mg by mouth daily.   . vitamin B-12 (CYANOCOBALAMIN) 1000 MCG tablet Take 1,000 mcg by mouth daily.  . vitamin C (ASCORBIC ACID) 500 MG tablet Take 500 mg by mouth daily.  Marland Kitchen zinc gluconate 50 MG tablet Take 50 mg by mouth daily.   No current facility-administered medications for this visit. (Other)      REVIEW OF SYSTEMS: ROS    Positive for: Endocrine, Cardiovascular, Eyes   Negative for: Constitutional, Gastrointestinal, Neurological, Skin, Genitourinary, Musculoskeletal, HENT, Respiratory, Psychiatric, Allergic/Imm, Heme/Lymph   Last edited by Debbrah Alar, COT on 12/07/2020  1:36 PM. (History)       ALLERGIES Allergies  Allergen Reactions  . Levaquin [Levofloxacin] Other (See Comments)    Seizures    PAST MEDICAL HISTORY Past Medical History:  Diagnosis Date  . Essential hypertension   .  GERD (gastroesophageal reflux disease)   . History of cardiac catheterization 2013   Mild coronary atherosclerosis - [facility]  . Hyperlipidemia   . Hypothyroidism    Past Surgical History:  Procedure Laterality Date  . APPENDECTOMY    . CARPAL TUNNEL RELEASE    . CATARACT EXTRACTION W/PHACO Left 12/28/2013   Procedure: CATARACT EXTRACTION PHACO AND INTRAOCULAR LENS PLACEMENT (IOC);  Surgeon: Tonny Branch, MD;  Location: [facility];  Service: Ophthalmology;  Laterality: Left;  CDE 18.64  . CATARACT EXTRACTION W/PHACO Right 01/07/2014   Procedure: CATARACT EXTRACTION PHACO AND INTRAOCULAR LENS PLACEMENT (IOC);  Surgeon:  Tonny Branch, MD;  Location: [facility];  Service: Ophthalmology;  Laterality: Right;  CDE:11.01  . COLONOSCOPY    . ESOPHAGOGASTRODUODENOSCOPY N/A 10/11/2014   RMR:non critical schatzkis ring/HH  . LUMBAR LAMINECTOMY     X 2  . TONSILLECTOMY      FAMILY HISTORY Family History  Problem Relation Age of Onset  . Colon cancer Other        no first degree relatives  . Renal cancer Father   . Heart attack Father   . Glaucoma Mother   . Heart failure Mother     SOCIAL HISTORY Social History   Tobacco Use  . Smoking status: Never Smoker  . Smokeless tobacco: Never Used  Vaping Use  . Vaping Use: Never used  Substance Use Topics  . Alcohol use: No  . Drug use: No         OPHTHALMIC EXAM:  Base Eye Exam    Visual Acuity (Snellen - Linear)      Right Left   Dist cc 20/20 -1 20/20 -1   Correction: Glasses       Tonometry (Tonopen, 1:41 PM)      Right Left   Pressure 14 17       Pupils      Dark Light Shape React APD   Right 3 2 Round Brisk None   Left 3 2 Round Brisk None       Visual Fields (Counting fingers)      Left Right     Full       Extraocular Movement      Right Left    Full, Ortho Full, Ortho       Neuro/Psych    Oriented x3: Yes   Mood/Affect: Normal       Dilation    Both eyes: 1.0% Mydriacyl, 2.5% Phenylephrine @ 1:41 PM        Slit Lamp and Fundus Exam    Slit Lamp Exam      Right Left   Lids/Lashes Dermatochalasis - upper lid, Ptosis Dermatochalasis - upper lid, Ptosis   Conjunctiva/Sclera mild temporal Pinguecula mild nasal temporal Pinguecula   Cornea Arcus, 2+Punctate epithelial erosions, Well healed cataract wounds, endo pigment Arcus, 1-2+ inferior Punctate epithelial erosions, Well healed cataract wounds, mild endo pigment    Anterior Chamber Deep and quiet Deep and quiet   Iris Round and dilated Round and moderately dilated to 4.71m   Lens PC IOL in good position with open PC PC IOL in good position with open PC   Vitreous  Vitreous syneresis, Posterior vitreous detachment mild Vitreous syneresis, Posterior vitreous detachment, vitreous condensations inferiorly       Fundus Exam      Right Left   Disc Pink and Sharp Pink and Sharp   C/D Ratio 0.3 0.3   Macula Blunted foveal reflex, trace cystic changes/edema -- improved;  focal cluster of MA/IRH temporal fovea Flat, good foveal reflex, mild RPE mottling, trace Epiretinal membrane, No heme or edema   Vessels Vascular attenuation, mild tortuosity Vascular attenuation, mild tortuosity   Periphery Attached, no heme Attached, no heme          IMAGING AND PROCEDURES  Imaging and Procedures for $RemoveBefore'@TODAY'[id number]$ @  OCT, Retina - OU - Both Eyes       Right Eye Quality was good. Central Foveal Thickness: 282. Progression has improved. Findings include no SRF, intraretinal fluid, intraretinal hyper-reflective material, normal foveal contour (interval improvemetn in IRF / cystic changes temporal fovea, persistent focal IRHM).   Left Eye Quality was good. Central Foveal Thickness: 266. Progression has been stable. Findings include normal foveal contour, no IRF, no SRF.   Notes *Images captured and stored on drive  Diagnosis / Impression:  OD: NFP, no SRF, interval improvemetn in IRF / cystic changes temporal fovea, persistent focal IRHM OS: NFP, no IRF/SRF   Clinical management:  See below  Abbreviations: NFP - Normal foveal profile. CME - cystoid macular edema. PED - pigment epithelial detachment. IRF - intraretinal fluid. SRF - subretinal fluid. EZ - ellipsoid zone. ERM - epiretinal membrane. ORA - outer retinal atrophy. ORT - outer retinal tubulation. SRHM - subretinal hyper-reflective material                 ASSESSMENT/PLAN:    ICD-10-CM   1. Hypertensive retinopathy of both eyes  H35.033   2. Essential hypertension  I10   3. Retinal edema  H35.81 OCT, Retina - OU - Both Eyes  4. Pseudophakia of both eyes  Z96.1     1-3. Hypertensive retinopathy  OU  - focal intraretinal hemes in macula OD -- persistent, but improved  - OCT shows interval decrease in cystic changes OD, temporal fovea -- ?mild old BRVO  - BCVA remains 20/20 OU  - FA (05.05.21) shows focal MA in macula with late leakage OU    - BP in office measured 167/72 R arm and 147/67 L arm (03.05.21); 147/67 (11.06.2020); 169/73 at prior visit 07.06.2020  - meds adjusted by PCP and cardiology -- BP improved per pt and daughter report  - discussed importance of tight BP control  - monitor for now  - f/u 6-9 months, sooner prn, DFE/OCT  4. Pseudophakia OU  - s/p CE/IOL OU - Dr. Tonny Branch (2015)  - beautiful surgeries, doing well  - monitor    Ophthalmic Meds Ordered this visit:  No orders of the defined types were placed in this encounter.      Return for 6-9 mos - HTN retinopathy w/ macular heme -- Dilated Exam, OCT.  There are no Patient Instructions on file for this visit.   Explained the diagnoses, plan, and follow up with the patient and they expressed understanding.  Patient expressed understanding of the importance of proper follow up care.   This document serves as a record of services personally performed by Gardiner Sleeper, MD, PhD. It was created on their behalf by Roselee Nova, COMT. The creation of this record is the provider's dictation and/or activities during the visit.  Electronically signed by: Roselee Nova, COMT 12/07/20 2:02 PM    Gardiner Sleeper, M.D., Ph.D. Diseases & Surgery of the Retina and Vitreous [facility]  I have reviewed the above documentation for accuracy and completeness, and I agree with the above. Gardiner Sleeper, M.D., Ph.D. 12/07/20 2:02 PM  Abbreviations: Jerilynn Mages  myopia (nearsighted); A astigmatism; H hyperopia (farsighted); P presbyopia; Mrx spectacle prescription;  CTL contact lenses; OD right eye; OS left eye; OU both eyes  XT exotropia; ET esotropia; PEK punctate epithelial keratitis; PEE punctate  epithelial erosions; DES dry eye syndrome; MGD meibomian gland dysfunction; ATs artificial tears; PFAT's preservative free artificial tears; [place] nuclear sclerotic cataract; PSC posterior subcapsular cataract; ERM epi-retinal membrane; PVD posterior vitreous detachment; RD retinal detachment; DM diabetes mellitus; DR diabetic retinopathy; NPDR non-proliferative diabetic retinopathy; PDR proliferative diabetic retinopathy; CSME clinically significant macular edema; DME diabetic macular edema; dbh dot blot hemorrhages; CWS cotton wool spot; POAG primary open angle glaucoma; C/D cup-to-disc ratio; HVF humphrey visual field; GVF goldmann visual field; OCT optical coherence tomography; IOP intraocular pressure; BRVO Branch retinal vein occlusion; CRVO central retinal vein occlusion; CRAO central retinal artery occlusion; BRAO branch retinal artery occlusion; RT retinal tear; SB scleral buckle; PPV pars plana vitrectomy; VH Vitreous hemorrhage; PRP panretinal laser photocoagulation; IVK intravitreal kenalog; VMT vitreomacular traction; MH Macular hole;  NVD neovascularization of the disc; NVE neovascularization elsewhere; AREDS age related eye disease study; ARMD age related macular degeneration; POAG primary open angle glaucoma; EBMD epithelial/anterior basement membrane dystrophy; ACIOL anterior chamber intraocular lens; IOL intraocular lens; PCIOL posterior chamber intraocular lens; Phaco/IOL phacoemulsification with intraocular lens placement; [place] photorefractive keratectomy; LASIK laser assisted in situ keratomileusis; HTN hypertension; DM diabetes mellitus; COPD chronic obstructive pulmonary disease

## 2020-12-06 DIAGNOSIS — E890 Postprocedural hypothyroidism: Secondary | ICD-10-CM | POA: Diagnosis not present

## 2020-12-06 DIAGNOSIS — E871 Hypo-osmolality and hyponatremia: Secondary | ICD-10-CM | POA: Diagnosis not present

## 2020-12-07 ENCOUNTER — Encounter (INDEPENDENT_AMBULATORY_CARE_PROVIDER_SITE_OTHER): Payer: Self-pay | Admitting: Ophthalmology

## 2020-12-07 ENCOUNTER — Ambulatory Visit (INDEPENDENT_AMBULATORY_CARE_PROVIDER_SITE_OTHER): Payer: Medicare Other | Admitting: Ophthalmology

## 2020-12-07 ENCOUNTER — Other Ambulatory Visit: Payer: Self-pay

## 2020-12-07 DIAGNOSIS — I10 Essential (primary) hypertension: Secondary | ICD-10-CM | POA: Diagnosis not present

## 2020-12-07 DIAGNOSIS — Z961 Presence of intraocular lens: Secondary | ICD-10-CM | POA: Diagnosis not present

## 2020-12-07 DIAGNOSIS — H35033 Hypertensive retinopathy, bilateral: Secondary | ICD-10-CM | POA: Diagnosis not present

## 2020-12-07 DIAGNOSIS — H3581 Retinal edema: Secondary | ICD-10-CM | POA: Diagnosis not present

## 2020-12-07 LAB — COMPREHENSIVE METABOLIC PANEL
ALT: 21 IU/L (ref 0–32)
AST: 18 IU/L (ref 0–40)
Albumin/Globulin Ratio: 2.2 (ref 1.2–2.2)
Albumin: 4.1 g/dL (ref 3.6–4.6)
Alkaline Phosphatase: 89 IU/L (ref 44–121)
BUN/Creatinine Ratio: 30 — ABNORMAL HIGH (ref 12–28)
BUN: 23 mg/dL (ref 8–27)
Bilirubin Total: 0.3 mg/dL (ref 0.0–1.2)
CO2: 24 mmol/L (ref 20–29)
Calcium: 9.2 mg/dL (ref 8.7–10.3)
Chloride: 103 mmol/L (ref 96–106)
Creatinine, Ser: 0.77 mg/dL (ref 0.57–1.00)
Globulin, Total: 1.9 g/dL (ref 1.5–4.5)
Glucose: 96 mg/dL (ref 65–99)
Potassium: 4.5 mmol/L (ref 3.5–5.2)
Sodium: 141 mmol/L (ref 134–144)
Total Protein: 6 g/dL (ref 6.0–8.5)
eGFR: 76 mL/min/{1.73_m2} (ref >59)

## 2020-12-07 LAB — TSH: TSH: 1.53 u[IU]/mL (ref 0.450–4.500)

## 2020-12-07 LAB — T4, FREE: Free T4: 1.71 ng/dL (ref 0.82–1.77)

## 2020-12-15 ENCOUNTER — Encounter: Payer: Self-pay | Admitting: "Endocrinology

## 2020-12-15 ENCOUNTER — Ambulatory Visit: Payer: Medicare Other | Admitting: "Endocrinology

## 2020-12-15 ENCOUNTER — Other Ambulatory Visit: Payer: Self-pay

## 2020-12-15 VITALS — BP 152/76 | HR 68 | Ht 62.5 in | Wt 127.8 lb

## 2020-12-15 DIAGNOSIS — E871 Hypo-osmolality and hyponatremia: Secondary | ICD-10-CM

## 2020-12-15 DIAGNOSIS — E890 Postprocedural hypothyroidism: Secondary | ICD-10-CM

## 2020-12-15 NOTE — Progress Notes (Signed)
12/15/2020, 12:35 PM  Endocrinology follow-up note   Subjective:    Patient ID: Kaitlyn Sanchez, female    DOB: November 13, 1934, PCP [name], MD   Past Medical History:  Diagnosis Date  . Essential hypertension   . GERD (gastroesophageal reflux disease)   . History of cardiac catheterization 2013   Mild coronary atherosclerosis - [facility]  . Hyperlipidemia   . Hypothyroidism    Past Surgical History:  Procedure Laterality Date  . APPENDECTOMY    . CARPAL TUNNEL RELEASE    . CATARACT EXTRACTION W/PHACO Left 12/28/2013   Procedure: CATARACT EXTRACTION PHACO AND INTRAOCULAR LENS PLACEMENT (IOC);  Surgeon: Tonny Branch, MD;  Location: [facility];  Service: Ophthalmology;  Laterality: Left;  CDE 18.64  . CATARACT EXTRACTION W/PHACO Right 01/07/2014   Procedure: CATARACT EXTRACTION PHACO AND INTRAOCULAR LENS PLACEMENT (IOC);  Surgeon: Tonny Branch, MD;  Location: [facility];  Service: Ophthalmology;  Laterality: Right;  CDE:11.01  . COLONOSCOPY    . ESOPHAGOGASTRODUODENOSCOPY N/A 10/11/2014   RMR:non critical schatzkis ring/HH  . LUMBAR LAMINECTOMY     X 2  . TONSILLECTOMY     Social History   Socioeconomic History  . Marital status: Widowed    Spouse name: Not on file  . Number of children: 2  . Years of education: Not on file  . Highest education level: Not on file  Occupational History  . 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 Smoking status: Never Smoker  . Smokeless tobacco: Never Used  Vaping Use  . Vaping Use: Never used  Substance and Sexual Activity  . Alcohol use: No  . Drug use: No  . Sexual activity: Not Currently  Other Topics Concern  . Not on file  Social History Narrative  . Not on file   Social Determinants of Health   Financial Resource Strain: Not on file  Food Insecurity: No Food Insecurity  . Worried About Charity fundraiser in the Last Year: Never true  . Ran  Out of Food in the Last Year: Never true  Transportation Needs: No Transportation Needs  . Lack of Transportation (Medical): No  . Lack of Transportation (Non-Medical): No  Physical Activity: Not on file  Stress: Not on file  Social Connections: Not on file   Family History  Problem Relation Age of Onset  . Colon cancer Other        no first degree relatives  . Renal cancer Father   . Heart attack Father   . Glaucoma Mother   . Heart failure Mother    Outpatient Encounter Medications as of 12/15/2020  Medication Sig  . amLODipine (NORVASC) 10 MG tablet Take 10 mg by mouth every evening.   Marland Kitchen aspirin EC 81 MG tablet Take 243 mg by mouth at bedtime.  . Calcium Carbonate-Vit D-Min (CALTRATE 600+D PLUS PO) Take 1 tablet by mouth daily.  . Chelated Potassium 99 MG TABS Take 1 tablet by mouth daily.  . Cholecalciferol (VITAMIN D3) 125 MCG (5000 UT) CAPS Take 1 capsule by mouth daily.  Marland Kitchen docusate sodium (COLACE) 100 MG capsule Take 300 mg by mouth at bedtime.   . Flaxseed, Linseed, (  FLAXSEED OIL) 1000 MG CAPS Take 1,000 mg by mouth daily.   Marland Kitchen glucosamine-chondroitin 500-400 MG tablet Take 1 tablet by mouth daily.  Marland Kitchen labetalol (NORMODYNE) 100 MG tablet Take 1 tablet by mouth 3 (three) times daily.  Marland Kitchen levothyroxine (SYNTHROID) 112 MCG tablet Take 112 mcg by mouth daily.  Marland Kitchen LORazepam (ATIVAN) 1 MG tablet Take 0.5 mg by mouth at bedtime as needed for anxiety.  . multivitamin-iron-minerals-folic acid (CENTRUM) chewable tablet Chew 1 tablet by mouth daily.  Marland Kitchen olmesartan (BENICAR) 40 MG tablet Take 40 mg by mouth daily.  . Omega 3 1200 MG CAPS Take 1,200 mg by mouth daily.   . vitamin B-12 (CYANOCOBALAMIN) 1000 MCG tablet Take 1,000 mcg by mouth daily.  . vitamin C (ASCORBIC ACID) 500 MG tablet Take 500 mg by mouth daily.  Marland Kitchen zinc gluconate 50 MG tablet Take 50 mg by mouth daily.   No facility-administered encounter medications on file as of 12/15/2020.   ALLERGIES: Allergies  Allergen  Reactions  . Levaquin [Levofloxacin] Other (See Comments)    Seizures    VACCINATION STATUS:  There is no immunization history on file for this patient.  HPI [name] is 86 y.o. female who presents today for a follow-up accompanied by her daughter.  PMD : [name], MD.  She follows in this clinic for hypothyroidism, and hyponatremia related to SIADH. -She is put on water restrictions to manage hyponatremia, currently drinking approximately 800 cc daily, previsit labs show sodium stable at 141.   She has no new complaints today.    -She reports reports compliance with her levothyroxine, currently 112 mcg p.o. daily. -She does have history of hypothyroidism related to radioactive iodine ablation approximately at age 3.  She is on a stable dose of levothyroxine  with no complaints.  Her previsit thyroid function tests are consistent with appropriate replacement.   -She denies craving for salt.  She denies any prior adrenal, pituitary, or pancreatic problems.   She denies any history of head injury, nor any history of malignancy.  She has a steady weight.  She has hypertension uncontrolled in the clinic, however patient states controlled at home.  She is on 3 medications including amlodipine, Benicar, and labetalol.  Review of Systems Limited as above.  Objective:    Vitals with BMI 12/15/2020 08/10/2020 06/16/2020  Height 5' 2.5" - 5' 2.5"  Weight 127 lbs 13 oz - 131 lbs  BMI 62.13 - 08.65  Systolic 784 696 295  Diastolic 76 62 57  Pulse 68 62 -    BP (!) 152/76   Pulse 68   Ht 5' 2.5" (1.588 m)   Wt 127 lb 12.8 oz (58 kg)   BMI 23.00 kg/m   Wt Readings from Last 3 Encounters:  12/15/20 127 lb 12.8 oz (58 kg)  06/16/20 131 lb (59.4 kg)  05/11/20 131 lb 3.2 oz (59.5 kg)    Physical Exam   CMP ( most recent) CMP     Component Value Date/Time   NA 141 12/06/2020 0900   K 4.5 12/06/2020 0900   CL 103 12/06/2020 0900   CO2 24 12/06/2020 0900   GLUCOSE 96  12/06/2020 0900   GLUCOSE 93 03/19/2020 0727   BUN 23 12/06/2020 0900   CREATININE 0.77 12/06/2020 0900   CREATININE 0.62 08/19/2015 1237   CALCIUM 9.2 12/06/2020 0900   PROT 6.0 12/06/2020 0900   ALBUMIN 4.1 12/06/2020 0900   AST 18 12/06/2020 0900   ALT 21  12/06/2020 0900   ALKPHOS 89 12/06/2020 0900   BILITOT 0.3 12/06/2020 0900   GFRNONAA 72 06/07/2020 0833   GFRAA 83 06/07/2020 0833     Lab Results  Component Value Date   TSH 1.530 12/06/2020   TSH 1.370 06/07/2020   TSH 0.764 03/18/2020   TSH 12.285 (H) 02/24/2019   FREET4 1.71 12/06/2020   FREET4 1.74 06/07/2020      Assessment & Plan:   1. Hyponatremia/SIADH 2. Hypothyroidism following radioiodine therapy  -Her previsit CMP showed sodium stable at between 141-145.  Serum and urine osmolality is appropriate at this time. -Review of her current and prior hospital records indicate high likelihood of SIADH as a cause of her hyponatremia.  -Treatment approach was discussed again in detail with her and her daughter.    Mainstay of therapy remains to be water restrictions . -  I advised her to limit her liquid intake to less than 800 mL a day.  She is allowed to consume half of her fluid in the form of free water, and  other half in various liquid forms including electrolytes, juice, milk, coffee, etc.  -Patients with subacute hyponatremia from SIADH tend  to have a lower hypothalamic setpoint for osmostat, hence,  correction of sodium into the 140s is unnecessary.  - They generally tend to do well with sodium in the low normal range between 130-135 mmol/L.  She is allowed to consume ten more  ounce of fluid daily during this warm season.  -She is advised to call clinic if she started to have symptoms including lightheadedness, seizures.  -Regarding her hypothyroidism: She is on a stable dose of levothyroxine 112 mcg p.o. daily before breakfast.  Her previsit thyroid function tests are consistent with appropriate  replacement.    - We discussed about the correct intake of her thyroid hormone, on empty stomach at fasting, with water, separated by at least 30 minutes from breakfast and other medications,  and separated by more than 4 hours from calcium, iron, multivitamins, acid reflux medications (PPIs). -Patient is made aware of the fact that thyroid hormone replacement is needed for life, dose to be adjusted by periodic monitoring of thyroid function tests.   - I did not initiate any new prescriptions today. - she is advised to maintain close follow up with [name], MD for primary care needs.     - Time spent on this patient care encounter:  30 minutes of which 50% was spent in  counseling and the rest reviewing  her current and  previous labs / studies and medications  doses and developing a plan for long term care, and documenting this care. Kaitlyn Sanchez  participated in the discussions, expressed understanding, and voiced agreement with the above plans.  All questions were answered to her satisfaction. she is encouraged to contact clinic should she have any questions or concerns prior to her return visit.  Follow up plan: Return in about 6 months (around 06/17/2021) for F/U with Pre-visit Labs.   Glade Lloyd, MD [facility] [address] [place], [place] [address] Phone: [phone]  Fax: [phone]     12/15/2020, 12:35 PM  This note was partially dictated with voice recognition software. Similar sounding words can be transcribed inadequately or may not  be corrected upon review.

## 2020-12-20 DIAGNOSIS — M810 Age-related osteoporosis without current pathological fracture: Secondary | ICD-10-CM | POA: Diagnosis not present

## 2020-12-20 DIAGNOSIS — H3560 Retinal hemorrhage, unspecified eye: Secondary | ICD-10-CM | POA: Diagnosis not present

## 2020-12-20 DIAGNOSIS — E039 Hypothyroidism, unspecified: Secondary | ICD-10-CM | POA: Diagnosis not present

## 2020-12-20 DIAGNOSIS — E871 Hypo-osmolality and hyponatremia: Secondary | ICD-10-CM | POA: Diagnosis not present

## 2020-12-20 DIAGNOSIS — Z0000 Encounter for general adult medical examination without abnormal findings: Secondary | ICD-10-CM | POA: Diagnosis not present

## 2020-12-20 DIAGNOSIS — Z6823 Body mass index (BMI) 23.0-23.9, adult: Secondary | ICD-10-CM | POA: Diagnosis not present

## 2020-12-20 DIAGNOSIS — M1991 Primary osteoarthritis, unspecified site: Secondary | ICD-10-CM | POA: Diagnosis not present

## 2021-01-04 DIAGNOSIS — I10 Essential (primary) hypertension: Secondary | ICD-10-CM | POA: Diagnosis not present

## 2021-01-04 DIAGNOSIS — E7849 Other hyperlipidemia: Secondary | ICD-10-CM | POA: Diagnosis not present

## 2021-01-16 ENCOUNTER — Ambulatory Visit: Payer: Medicare Other | Admitting: Orthopedic Surgery

## 2021-01-16 ENCOUNTER — Ambulatory Visit: Payer: Medicare Other

## 2021-01-16 ENCOUNTER — Encounter: Payer: Self-pay | Admitting: Orthopedic Surgery

## 2021-01-16 ENCOUNTER — Other Ambulatory Visit: Payer: Self-pay

## 2021-01-16 VITALS — BP 147/69 | HR 76 | Ht 62.5 in | Wt 126.0 lb

## 2021-01-16 DIAGNOSIS — M25361 Other instability, right knee: Secondary | ICD-10-CM

## 2021-01-16 NOTE — Progress Notes (Signed)
Chief Complaint  Patient presents with  . Knee Problem    Right knee gives out     Ms. Kaitlyn Sanchez is an 86 year old female status post 2 lumbar surgeries presents with right leg weakness and giving way.  She says she does not have any knee pain just feels like the right knee will give way at times when she is walking straight.  Does not complain of pivoting issues and denies any trauma  Symptoms was came on over the last 6 weeks  Review of systems she is not having any pain with the left leg symptoms  Past Medical History:  Diagnosis Date  . Essential hypertension   . GERD (gastroesophageal reflux disease)   . History of cardiac catheterization 2013   Mild coronary atherosclerosis - [facility]  . Hyperlipidemia   . Hypothyroidism   ' Past Surgical History:  Procedure Laterality Date  . APPENDECTOMY    . CARPAL TUNNEL RELEASE    . CATARACT EXTRACTION W/PHACO Left 12/28/2013   Procedure: CATARACT EXTRACTION PHACO AND INTRAOCULAR LENS PLACEMENT (IOC);  Surgeon: Tonny Branch, MD;  Location: [facility];  Service: Ophthalmology;  Laterality: Left;  CDE 18.64  . CATARACT EXTRACTION W/PHACO Right 01/07/2014   Procedure: CATARACT EXTRACTION PHACO AND INTRAOCULAR LENS PLACEMENT (IOC);  Surgeon: Tonny Branch, MD;  Location: [facility];  Service: Ophthalmology;  Laterality: Right;  CDE:11.01  . COLONOSCOPY    . ESOPHAGOGASTRODUODENOSCOPY N/A 10/11/2014   RMR:non critical schatzkis ring/HH  . LUMBAR LAMINECTOMY     X 2  . TONSILLECTOMY      BP (!) 147/69   Pulse 76   Ht 5' 2.5" (1.588 m)   Wt 126 lb (57.2 kg)   BMI 22.68 kg/m   She is well-developed well-nourished woman who hygiene is excellent  She is awake alert and oriented x3  Her right knee is stable to anterior posterior testing as well as collateral ligament testing she has no pain tenderness or swelling has full range of motion  I tested her left and right legs for quadricep strength she is weak on the right side in terms of her  extension  X-rays of the knee show very minimal degenerative changes with chondrocalcinosis  Assessment and plan 86 year old female with no knee pain right knee weakness history of spinal surgery  Appears to have quadriceps weakness recommend quadricep strengthening exercises and a knee sleeve  No other interventions needed  Encounter Diagnosis  Name Primary?  . Knee gives out, right Yes

## 2021-01-25 ENCOUNTER — Other Ambulatory Visit: Payer: Self-pay | Admitting: *Deleted

## 2021-01-25 NOTE — Patient Outreach (Signed)
[facility]) Care Management  01/25/2021  Kaitlyn Sanchez 1935/09/18 [id number]  Unsuccessful outreach attempt made to patient. RN Health Coach left HIPAA compliant voicemail message along with her contact information.  Plan: RN Health Coach will call patient within the month of May.  [name] RN, BSN [place] [phone] [email or web address]

## 2021-02-04 DIAGNOSIS — E7849 Other hyperlipidemia: Secondary | ICD-10-CM | POA: Diagnosis not present

## 2021-02-04 DIAGNOSIS — I10 Essential (primary) hypertension: Secondary | ICD-10-CM | POA: Diagnosis not present

## 2021-02-28 ENCOUNTER — Other Ambulatory Visit: Payer: Self-pay | Admitting: *Deleted

## 2021-02-28 NOTE — Patient Outreach (Signed)
[facility]) Care Management  02/28/2021  Kaitlyn Sanchez Jun 12, 1935 [id number]  Unsuccessful outreach attempt made to patient. RN Health Coach left HIPAA compliant voicemail message along with her contact information.  Plan: RN Health Coach will call patient within the month of June.  [name] RN, BSN [name] [phone] [name]

## 2021-03-07 DIAGNOSIS — E7849 Other hyperlipidemia: Secondary | ICD-10-CM | POA: Diagnosis not present

## 2021-03-07 DIAGNOSIS — I10 Essential (primary) hypertension: Secondary | ICD-10-CM | POA: Diagnosis not present

## 2021-03-14 DIAGNOSIS — Z1283 Encounter for screening for malignant neoplasm of skin: Secondary | ICD-10-CM | POA: Diagnosis not present

## 2021-03-14 DIAGNOSIS — C44722 Squamous cell carcinoma of skin of right lower limb, including hip: Secondary | ICD-10-CM | POA: Diagnosis not present

## 2021-03-14 DIAGNOSIS — X32XXXA Exposure to sunlight, initial encounter: Secondary | ICD-10-CM | POA: Diagnosis not present

## 2021-03-14 DIAGNOSIS — D225 Melanocytic nevi of trunk: Secondary | ICD-10-CM | POA: Diagnosis not present

## 2021-03-14 DIAGNOSIS — D0461 Carcinoma in situ of skin of right upper limb, including shoulder: Secondary | ICD-10-CM | POA: Diagnosis not present

## 2021-03-14 DIAGNOSIS — L570 Actinic keratosis: Secondary | ICD-10-CM | POA: Diagnosis not present

## 2021-03-15 ENCOUNTER — Other Ambulatory Visit (HOSPITAL_COMMUNITY): Payer: Self-pay | Admitting: Family Medicine

## 2021-03-15 DIAGNOSIS — Z1231 Encounter for screening mammogram for malignant neoplasm of breast: Secondary | ICD-10-CM

## 2021-03-23 ENCOUNTER — Other Ambulatory Visit: Payer: Self-pay | Admitting: *Deleted

## 2021-03-23 NOTE — Patient Outreach (Signed)
[facility]) Care Management  03/23/2021  Kaitlyn Sanchez Mar 05, 1935 [id number]  Unsuccessful outreach attempt made to patient. Patient answered the phone and stated that she would not be able to speak today. She did request that this nurse call back at a later date.   Plan: RN Health Coach will call patient within the month of July.  [name] RN, BSN [place] [phone] [email or web address]

## 2021-04-06 DIAGNOSIS — E7849 Other hyperlipidemia: Secondary | ICD-10-CM | POA: Diagnosis not present

## 2021-04-06 DIAGNOSIS — I10 Essential (primary) hypertension: Secondary | ICD-10-CM | POA: Diagnosis not present

## 2021-04-12 ENCOUNTER — Ambulatory Visit (HOSPITAL_COMMUNITY): Payer: Medicare Other

## 2021-04-13 ENCOUNTER — Ambulatory Visit (HOSPITAL_COMMUNITY)
Admission: RE | Admit: 2021-04-13 | Discharge: 2021-04-13 | Disposition: A | Discharge status: Home / Self Care | Payer: Medicare Other | Source: Ambulatory Visit | Attending: Family Medicine | Admitting: Family Medicine

## 2021-04-13 ENCOUNTER — Other Ambulatory Visit: Payer: Self-pay

## 2021-04-13 DIAGNOSIS — Z1231 Encounter for screening mammogram for malignant neoplasm of breast: Secondary | ICD-10-CM | POA: Diagnosis not present

## 2021-04-17 ENCOUNTER — Other Ambulatory Visit: Payer: Self-pay | Admitting: *Deleted

## 2021-04-17 NOTE — Patient Outreach (Signed)
[facility]) Care Management  04/17/2021  Kaitlyn Sanchez [birth year] [id number]  Unsuccessful outreach attempt made to patient. RN Health Coach left HIPAA compliant voicemail message along with her contact information.  Plan: RN Health Coach will call patient within the month of August.  [name] RN, BSN [place] [phone] [email or web address]

## 2021-04-26 DIAGNOSIS — Z08 Encounter for follow-up examination after completed treatment for malignant neoplasm: Secondary | ICD-10-CM | POA: Diagnosis not present

## 2021-04-26 DIAGNOSIS — Z85828 Personal history of other malignant neoplasm of skin: Secondary | ICD-10-CM | POA: Diagnosis not present

## 2021-04-27 DIAGNOSIS — E039 Hypothyroidism, unspecified: Secondary | ICD-10-CM | POA: Diagnosis not present

## 2021-04-27 DIAGNOSIS — E782 Mixed hyperlipidemia: Secondary | ICD-10-CM | POA: Diagnosis not present

## 2021-04-27 DIAGNOSIS — E7849 Other hyperlipidemia: Secondary | ICD-10-CM | POA: Diagnosis not present

## 2021-04-27 DIAGNOSIS — M1991 Primary osteoarthritis, unspecified site: Secondary | ICD-10-CM | POA: Diagnosis not present

## 2021-04-27 DIAGNOSIS — H3560 Retinal hemorrhage, unspecified eye: Secondary | ICD-10-CM | POA: Diagnosis not present

## 2021-04-27 DIAGNOSIS — E871 Hypo-osmolality and hyponatremia: Secondary | ICD-10-CM | POA: Diagnosis not present

## 2021-04-27 DIAGNOSIS — Z6823 Body mass index (BMI) 23.0-23.9, adult: Secondary | ICD-10-CM | POA: Diagnosis not present

## 2021-05-07 DIAGNOSIS — I10 Essential (primary) hypertension: Secondary | ICD-10-CM | POA: Diagnosis not present

## 2021-05-07 DIAGNOSIS — E7849 Other hyperlipidemia: Secondary | ICD-10-CM | POA: Diagnosis not present

## 2021-06-05 ENCOUNTER — Other Ambulatory Visit: Payer: Self-pay | Admitting: *Deleted

## 2021-06-05 NOTE — Patient Outreach (Signed)
[facility]) Care Management  [place]  06/05/2021   Kaitlyn Sanchez [birth year] [id number]  Subjective: Successful telephone outreach call to patient. HIPAA identifiers obtained. Patient states she is dong well at this time. Nurse discussed with patient her health goals and her health and wellness needs which were documented in the Epic system. Patient did not have any further questions or concerns today and did confirm that he/she has this nurse's contact number to call her if needed.   Encounter Medications:  Outpatient Encounter Medications as of 06/05/2021  Medication Sig   amLODipine (NORVASC) 10 MG tablet Take 10 mg by mouth every evening.    aspirin EC 81 MG tablet Take 243 mg by mouth at bedtime.   Calcium Carbonate-Vit D-Min (CALTRATE 600+D PLUS PO) Take 1 tablet by mouth daily.   Chelated Potassium 99 MG TABS Take 1 tablet by mouth daily.   Cholecalciferol (VITAMIN D3) 125 MCG (5000 UT) CAPS Take 1 capsule by mouth daily.   docusate sodium (COLACE) 100 MG capsule Take 300 mg by mouth at bedtime.    Flaxseed, Linseed, (FLAXSEED OIL) 1000 MG CAPS Take 1,000 mg by mouth daily.    glucosamine-chondroitin 500-400 MG tablet Take 1 tablet by mouth daily.   labetalol (NORMODYNE) 100 MG tablet Take 1 tablet by mouth 3 (three) times daily.   levothyroxine (SYNTHROID) 112 MCG tablet Take 112 mcg by mouth daily.   LORazepam (ATIVAN) 1 MG tablet Take 0.5 mg by mouth at bedtime as needed for anxiety.   multivitamin-iron-minerals-folic acid (CENTRUM) chewable tablet Chew 1 tablet by mouth daily.   olmesartan (BENICAR) 40 MG tablet Take 40 mg by mouth daily.   Omega 3 1200 MG CAPS Take 1,200 mg by mouth daily.    vitamin B-12 (CYANOCOBALAMIN) 1000 MCG tablet Take 1,000 mcg by mouth daily.   vitamin C (ASCORBIC ACID) 500 MG tablet Take 500 mg by mouth daily.   zinc gluconate 50 MG tablet Take 50 mg by mouth daily.   No facility-administered encounter medications on  file as of 06/05/2021.    Functional Status:  No flowsheet data found.  Fall/Depression Screening: Fall Risk  06/05/2021 11/17/2020 03/31/2020  Falls in the past year? 1 1 0  Number falls in past yr: 0 0 0  Injury with Fall? 1 1 0  Risk for fall due to : History of fall(s) Impaired mobility;Impaired balance/gait;History of fall(s) -  Follow up Falls prevention discussed;Education provided;Falls evaluation completed Falls evaluation completed;Education provided;Falls prevention discussed -   PHQ 2/9 Scores 11/17/2020 03/31/2020  PHQ - 2 Score 0 1    Assessment:   Care Plan There are no care plans that you recently modified to display for this patient.    Goals Addressed             This Visit's Progress    THN Patient will monitor her B/P daily and record the values within the next 90 days       Timeframe:  Long-Range Goal Priority:  High Start Date:  11/17/20                           Expected End Date: 11/17/21                      Follow Up Date 09/06/21    - check blood pressure daily - write blood pressure results in a log or diary  -Encouraged patient  to continue to stay physically active by doing her house cleaning and discussed scheduling exercise on certain days.  Why is this important?   You won't feel high blood pressure, but it can still hurt your blood vessels.  High blood pressure can cause heart or kidney problems. It can also cause a stroke.  Making lifestyle changes like losing a little weight or eating less salt will help.  Checking your blood pressure at home and at different times of the day can help to control blood pressure.  If the doctor prescribes medicine remember to take it the way the doctor ordered.  Call the office if you cannot afford the medicine or if there are questions about it.     Notes: 06/05/21: Patient states she is feeling well. She reports taking her B/P several times weekly and explains that it has been under control. Patient did  receive the calendar booklet and does record her values. Her systolic ranges have been 123456 and her diastolic ranges have been 50-mid 60's. Patient states she is working towards increasing her exercise routine.  11/17/20: Patient reports taking her B/P daily. Nurse encouraged patient to write her daily values down to be able to follow her B/P trends closely. Nurse will send patient a calendar booklet to record her values. Patient states she does aerobic exercises routinely at home for about 30 minutes. Nurse shared [employer] adult exercise TV program information with patient.        Plan: RN Health Coach will send PCP today's assessment note and will call patient within the month of November. Follow-up: Patient agrees to Care Plan and Follow-up.  [name] RN, BSN [place] [phone] [email or web address]

## 2021-06-05 NOTE — Patient Instructions (Signed)
Goals Addressed             This Visit's Progress    THN Patient will monitor her B/P daily and record the values within the next 90 days       Timeframe:  Long-Range Goal Priority:  High Start Date:  11/17/20                           Expected End Date: 11/17/21                      Follow Up Date 09/06/21    - check blood pressure daily - write blood pressure results in a log or diary  -Encouraged patient to continue to stay physically active by doing her house cleaning and discussed scheduling exercise on certain days.  Why is this important?   You won't feel high blood pressure, but it can still hurt your blood vessels.  High blood pressure can cause heart or kidney problems. It can also cause a stroke.  Making lifestyle changes like losing a little weight or eating less salt will help.  Checking your blood pressure at home and at different times of the day can help to control blood pressure.  If the doctor prescribes medicine remember to take it the way the doctor ordered.  Call the office if you cannot afford the medicine or if there are questions about it.     Notes: 06/05/21: Patient states she is feeling well. She reports taking her B/P several times weekly and explains that it has been under control. Patient did receive the calendar booklet and does record her values. Her systolic ranges have been 123456 and her diastolic ranges have been 50-mid 60's. Patient states she is working towards increasing her exercise routine.  11/17/20: Patient reports taking her B/P daily. Nurse encouraged patient to write her daily values down to be able to follow her B/P trends closely. Nurse will send patient a calendar booklet to record her values. Patient states she does aerobic exercises routinely at home for about 30 minutes. Nurse shared [employer] adult exercise TV program information with patient.

## 2021-06-09 DIAGNOSIS — E890 Postprocedural hypothyroidism: Secondary | ICD-10-CM | POA: Diagnosis not present

## 2021-06-09 DIAGNOSIS — E871 Hypo-osmolality and hyponatremia: Secondary | ICD-10-CM | POA: Diagnosis not present

## 2021-06-14 LAB — COMPREHENSIVE METABOLIC PANEL
ALT: 15 IU/L (ref 0–32)
AST: 18 IU/L (ref 0–40)
Albumin/Globulin Ratio: 2.2 (ref 1.2–2.2)
Albumin: 4.2 g/dL (ref 3.6–4.6)
Alkaline Phosphatase: 93 IU/L (ref 44–121)
BUN/Creatinine Ratio: 23 (ref 12–28)
BUN: 19 mg/dL (ref 8–27)
Bilirubin Total: 0.3 mg/dL (ref 0.0–1.2)
CO2: 22 mmol/L (ref 20–29)
Calcium: 9.4 mg/dL (ref 8.7–10.3)
Chloride: 103 mmol/L (ref 96–106)
Creatinine, Ser: 0.82 mg/dL (ref 0.57–1.00)
Globulin, Total: 1.9 g/dL (ref 1.5–4.5)
Glucose: 91 mg/dL (ref 65–99)
Potassium: 4.4 mmol/L (ref 3.5–5.2)
Sodium: 141 mmol/L (ref 134–144)
Total Protein: 6.1 g/dL (ref 6.0–8.5)
eGFR: 70 mL/min/{1.73_m2} (ref >59)

## 2021-06-14 LAB — TSH: TSH: 1.84 u[IU]/mL (ref 0.450–4.500)

## 2021-06-14 LAB — T4, FREE: Free T4: 1.75 ng/dL (ref 0.82–1.77)

## 2021-06-19 ENCOUNTER — Encounter: Payer: Self-pay | Admitting: "Endocrinology

## 2021-06-19 ENCOUNTER — Ambulatory Visit: Payer: Medicare Other | Admitting: "Endocrinology

## 2021-06-19 ENCOUNTER — Other Ambulatory Visit: Payer: Self-pay

## 2021-06-19 VITALS — BP 158/76 | HR 56 | Ht 62.5 in | Wt 131.4 lb

## 2021-06-19 DIAGNOSIS — E890 Postprocedural hypothyroidism: Secondary | ICD-10-CM | POA: Diagnosis not present

## 2021-06-19 DIAGNOSIS — E871 Hypo-osmolality and hyponatremia: Secondary | ICD-10-CM | POA: Diagnosis not present

## 2021-06-19 NOTE — Progress Notes (Signed)
06/19/2021, 12:26 PM  Endocrinology follow-up note   Subjective:    Patient ID: Kaitlyn Sanchez, female    DOB: 1934/12/12, PCP [name], MD   Past Medical History:  Diagnosis Date   Essential hypertension    GERD (gastroesophageal reflux disease)    History of cardiac catheterization 2013   Mild coronary atherosclerosis - [place]   Hyperlipidemia    Hypothyroidism    Past Surgical History:  Procedure Laterality Date   APPENDECTOMY     CARPAL TUNNEL RELEASE     CATARACT EXTRACTION W/PHACO Left 12/28/2013   Procedure: CATARACT EXTRACTION PHACO AND INTRAOCULAR LENS PLACEMENT ([place]);  Surgeon: Tonny Branch, MD;  Location: [facility];  Service: Ophthalmology;  Laterality: Left;  CDE 18.64   CATARACT EXTRACTION W/PHACO Right 01/07/2014   Procedure: CATARACT EXTRACTION PHACO AND INTRAOCULAR LENS PLACEMENT ([facility]);  Surgeon: Tonny Branch, MD;  Location: [facility];  Service: Ophthalmology;  Laterality: Right;  CDE:11.01   COLONOSCOPY     ESOPHAGOGASTRODUODENOSCOPY N/A 10/11/2014   RMR:non critical schatzkis ring/HH   LUMBAR LAMINECTOMY     X 2   TONSILLECTOMY     Social History   Socioeconomic History   Marital status: Widowed    Spouse name: Not on file   Number of children: 2   Years of education: Not on file   Highest education level: Not on file  Occupational History   Occupation: retired    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 [place]  Tobacco Use   Smoking status: Never   Smokeless tobacco: Never  Vaping Use   Vaping Use: Never used  Substance and Sexual Activity   Alcohol use: No   Drug use: No   Sexual activity: Not Currently  Other Topics Concern   Not on file  Social History Narrative   Not on file   Social Determinants of Health   Financial Resource Strain: Not on file  Food Insecurity: No Food Insecurity   Worried About Charity fundraiser in the Last Year: Never true   [place] in the Last Year: Never true   Transportation Needs: No Transportation Needs   Lack of Transportation (Medical): No   Lack of Transportation (Non-Medical): No  Physical Activity: Not on file  Stress: Not on file  Social Connections: Not on file   Family History  Problem Relation Age of Onset   Colon cancer Other        no first degree relatives   Renal cancer Father    Heart attack Father    Glaucoma Mother    Heart failure Mother    Outpatient Encounter Medications as of 06/19/2021  Medication Sig   amLODipine (NORVASC) 10 MG tablet Take 10 mg by mouth every evening.    aspirin EC 81 MG tablet Take 243 mg by mouth at bedtime.   Calcium Carbonate-Vit D-Min (CALTRATE 600+D PLUS PO) Take 1 tablet by mouth daily.   Chelated Potassium 99 MG TABS Take 1 tablet by mouth daily.   Cholecalciferol (VITAMIN D3) 125 MCG (5000 UT) CAPS Take 1 capsule by mouth daily.   docusate sodium (COLACE) 100 MG capsule Take 300 mg by mouth at bedtime.    Flaxseed, Linseed, (FLAXSEED OIL)  1000 MG CAPS Take 1,000 mg by mouth daily.    glucosamine-chondroitin 500-400 MG tablet Take 1 tablet by mouth daily.   labetalol (NORMODYNE) 100 MG tablet Take 1 tablet by mouth 3 (three) times daily.   levothyroxine (SYNTHROID) 112 MCG tablet Take 112 mcg by mouth daily.   LORazepam (ATIVAN) 1 MG tablet Take 0.5 mg by mouth 2 (two) times daily.   multivitamin-iron-minerals-folic acid (CENTRUM) chewable tablet Chew 1 tablet by mouth daily.   olmesartan (BENICAR) 40 MG tablet Take 40 mg by mouth daily.   Omega 3 1200 MG CAPS Take 1,200 mg by mouth daily.    vitamin B-12 (CYANOCOBALAMIN) 1000 MCG tablet Take 1,000 mcg by mouth daily.   vitamin C (ASCORBIC ACID) 500 MG tablet Take 500 mg by mouth daily.   zinc gluconate 50 MG tablet Take 50 mg by mouth daily.   No facility-administered encounter medications on file as of 06/19/2021.   ALLERGIES: Allergies  Allergen Reactions   Levaquin [Levofloxacin] Other (See Comments)    Seizures     VACCINATION STATUS:  There is no immunization history on file for this patient.  HPI Kaitlyn Sanchez is 86 y.o. female who presents today for a follow-up in the management of hypothyroidism and hyponatremia.  She is known to have longstanding hypothyroidism and hyponatremia associated with SIADH.  She continues to do well on fluid restrictions, currently drinking approximately 800 cc daily.  Her previsit labs show sodium stable at 141. She has no new complaints today.    -She denies craving for salt.  She denies any prior adrenal, pituitary, or pancreatic problems.   She denies any history of head injury, nor any history of malignancy.   -She reports reports compliance with her levothyroxine, currently 112 mcg p.o. daily. -Her hypothyroidism is related to her remote past  radioactive iodine ablation approximately at age 74.  She is on a stable dose of levothyroxine  with no complaints.  Her previsit thyroid function tests are consistent with appropriate replacement.    She has a steady weight.  She has hypertension better controlled than last visit.  She is on 3 medications including amlodipine, Benicar and labetalol.    Review of Systems Limited as above.  Objective:    Vitals with BMI 06/19/2021 01/16/2021 12/15/2020  Height 5' 2.5" 5' 2.5" 5' 2.5"  Weight 131 lbs 6 oz 126 lbs 127 lbs 13 oz  BMI 23.64 0000000 Q000111Q  Systolic 0000000 Q000111Q 0000000  Diastolic 76 69 76  Pulse 56 76 68    BP (!) 158/76   Pulse (!) 56   Ht 5' 2.5" (1.588 m)   Wt 131 lb 6.4 oz (59.6 kg)   BMI 23.65 kg/m   Wt Readings from Last 3 Encounters:  06/19/21 131 lb 6.4 oz (59.6 kg)  01/16/21 126 lb (57.2 kg)  12/15/20 127 lb 12.8 oz (58 kg)    Physical Exam   CMP ( most recent) CMP     Component Value Date/Time   NA 141 06/09/2021 0854   K 4.4 06/09/2021 0854   CL 103 06/09/2021 0854   CO2 22 06/09/2021 0854   GLUCOSE 91 06/09/2021 0854   GLUCOSE 93 03/19/2020 0727   BUN 19 06/09/2021 0854   CREATININE  0.82 06/09/2021 0854   CREATININE 0.62 08/19/2015 1237   CALCIUM 9.4 06/09/2021 0854   PROT 6.1 06/09/2021 0854   ALBUMIN 4.2 06/09/2021 0854   AST 18 06/09/2021 0854   ALT 15 06/09/2021 0854  ALKPHOS 93 06/09/2021 0854   BILITOT 0.3 06/09/2021 0854   GFRNONAA 72 06/07/2020 0833   GFRAA 83 06/07/2020 0833     Lab Results  Component Value Date   TSH 1.840 06/09/2021   TSH 1.530 12/06/2020   TSH 1.370 06/07/2020   TSH 0.764 03/18/2020   TSH 12.285 (H) 02/24/2019   FREET4 1.75 06/09/2021   FREET4 1.71 12/06/2020   FREET4 1.74 06/07/2020      Assessment & Plan:   1. Hyponatremia/SIADH 2. Hypothyroidism following radioiodine therapy  -Her previsit CMP showed sodium stable at between 141-145.  Serum and urine osmolality is appropriate at this time. -Review of her current and prior hospital records indicate high likelihood of SIADH as a cause of her hyponatremia.  -Treatment approach was discussed again in detail with her and her daughter.    -I have reemphasized that mainstay of therapy is being water striction.   -  I advised her to limit her liquid intake to less than 800 mL a day.  She is allowed to consume half of her fluid in the form of free water, and  other half in various liquid forms including electrolytes, juice, milk, coffee, etc.  -Patients with subacute hyponatremia from SIADH tend  to have a lower hypothalamic setpoint for osmostat, hence,  correction of sodium into the 140s is unnecessary.  - They generally tend to do well with sodium in the low normal range between 130-135 mmol/L.  She is allowed to consume ten more  ounce of fluid daily during warm seasons.  -She is advised to call clinic if she started to have symptoms including lightheadedness, seizures.  -Regarding her hypothyroidism: She is on a stable dose of levothyroxine 112 mcg p.o. daily before breakfast.  Her previsit thyroid function tests are consistent with appropriate replacement.    - We  discussed about the correct intake of her thyroid hormone, on empty stomach at fasting, with water, separated by at least 30 minutes from breakfast and other medications,  and separated by more than 4 hours from calcium, iron, multivitamins, acid reflux medications (PPIs). -Patient is made aware of the fact that thyroid hormone replacement is needed for life, dose to be adjusted by periodic monitoring of thyroid function tests.   - I did not initiate any new prescriptions today. - she is advised to maintain close follow up with [name], MD for primary care needs.   I spent 21 minutes in the care of the patient today including review of labs from Thyroid Function, CMP, and other relevant labs ; imaging/biopsy records (current and previous including abstractions from other facilities); face-to-face time discussing  her lab results and symptoms, medications doses, her options of short and long term treatment based on the latest standards of care / guidelines;   and documenting the encounter.  Kaitlyn Sanchez  participated in the discussions, expressed understanding, and voiced agreement with the above plans.  All questions were answered to her satisfaction. she is encouraged to contact clinic should she have any questions or concerns prior to her return visit.  Follow up plan: Return in about 6 months (around 12/17/2021) for F/U with Pre-visit Labs.   Glade Lloyd, MD [facility] [address] [place], [place] [address] Phone: [phone]  Fax: [phone]     06/19/2021, 12:26 PM  This note was partially dictated with voice recognition software. Similar sounding words can be transcribed inadequately or may not  be corrected upon review.

## 2021-06-27 DIAGNOSIS — B078 Other viral warts: Secondary | ICD-10-CM | POA: Diagnosis not present

## 2021-06-27 DIAGNOSIS — Z85828 Personal history of other malignant neoplasm of skin: Secondary | ICD-10-CM | POA: Diagnosis not present

## 2021-06-27 DIAGNOSIS — Z08 Encounter for follow-up examination after completed treatment for malignant neoplasm: Secondary | ICD-10-CM | POA: Diagnosis not present

## 2021-07-25 NOTE — Progress Notes (Signed)
[facility] Note  07/26/2021     CHIEF COMPLAINT Patient presents for Retina Follow Up   HISTORY OF PRESENT ILLNESS: Kaitlyn Sanchez is a 86 y.o. female who presents to the clinic today for:   HPI     Retina Follow Up   Patient presents with  Other.  In both eyes.  This started years ago.  Severity is mild.  Duration of 7.5 months.  Since onset it is stable.  I, the attending physician,  performed the HPI with the patient and updated documentation appropriately.        Comments   86 y/o female pt here for 7.5 mo f/u for HTN Ret OU.  No change in [place] OU.  Denies pain.  Has been noticing a few more floaters OU.  Sees an occasional brief flash when moving from light to dark areas.  AT prn OU.      Last edited by Bernarda Caffey, MD on 07/26/2021  1:53 PM.     Referring physician: [name], MD [address] [place],  [place] [address]  HISTORICAL INFORMATION:   Selected notes from the MEDICAL RECORD NUMBER Referred by Dr. Madelin Headings for concern of diabetic retinopathy LEE: 03.11.20 (M. 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 (Ophthalmic Drugs)   No current facility-administered medications for this visit. (Ophthalmic Drugs)   Current Outpatient Medications (Other)  Medication Sig   amLODipine (NORVASC) 10 MG tablet Take 10 mg by mouth every evening.    aspirin EC 81 MG tablet Take 243 mg by mouth at bedtime.   Calcium Carbonate-Vit D-Min (CALTRATE 600+D PLUS PO) Take 1 tablet by mouth daily.   Chelated Potassium 99 MG TABS Take 1 tablet by mouth daily.   Cholecalciferol (VITAMIN D3) 125 MCG (5000 UT) CAPS Take 1 capsule by mouth daily.   docusate sodium (COLACE) 100 MG capsule Take 300 mg by mouth at bedtime.    Flaxseed, Linseed, (FLAXSEED OIL) 1000 MG CAPS Take  1,000 mg by mouth daily.    glucosamine-chondroitin 500-400 MG tablet Take 1 tablet by mouth daily.   labetalol (NORMODYNE) 100 MG tablet Take 1 tablet by mouth 3 (three) times daily.   levothyroxine (SYNTHROID) 112 MCG tablet Take 112 mcg by mouth daily.   LORazepam (ATIVAN) 1 MG tablet Take 0.5 mg by mouth 2 (two) times daily.   multivitamin-iron-minerals-folic acid (CENTRUM) chewable tablet Chew 1 tablet by mouth daily.   olmesartan (BENICAR) 40 MG tablet Take 40 mg by mouth daily.   Omega 3 1200 MG CAPS Take 1,200 mg by mouth daily.    vitamin B-12 (CYANOCOBALAMIN) 1000 MCG tablet Take 1,000 mcg by mouth daily.   vitamin C (ASCORBIC ACID) 500 MG tablet Take 500 mg by mouth daily.   zinc gluconate 50 MG tablet Take 50 mg by mouth daily.   No current facility-administered medications for this visit. (Other)   REVIEW OF SYSTEMS: ROS   Positive for: Gastrointestinal, Eyes, Respiratory Negative for: Constitutional, Neurological, Skin, Genitourinary, Musculoskeletal, HENT, Endocrine, Cardiovascular, Psychiatric, Allergic/Imm, Heme/Lymph Last edited by Matthew Folks, COA on 07/26/2021  1:23 PM.    ALLERGIES Allergies  Allergen Reactions   Levaquin [Levofloxacin] Other (See Comments)    Seizures   PAST MEDICAL HISTORY Past Medical History:  Diagnosis Date   Essential hypertension    GERD (  gastroesophageal reflux disease)    History of cardiac catheterization 2013   Mild coronary atherosclerosis - [facility]   Hyperlipidemia    Hypertensive retinopathy    Hypothyroidism    Past Surgical History:  Procedure Laterality Date   APPENDECTOMY     CARPAL TUNNEL RELEASE     CATARACT EXTRACTION W/PHACO Left 12/28/2013   Procedure: CATARACT EXTRACTION PHACO AND INTRAOCULAR LENS PLACEMENT ([place]);  Surgeon: Tonny Branch, MD;  Location: [facility];  Service: Ophthalmology;  Laterality: Left;  CDE 18.64   CATARACT EXTRACTION W/PHACO Right 01/07/2014   Procedure: CATARACT EXTRACTION PHACO AND  INTRAOCULAR LENS PLACEMENT (IOC);  Surgeon: Tonny Branch, MD;  Location: [facility];  Service: Ophthalmology;  Laterality: Right;  CDE:11.01   COLONOSCOPY     ESOPHAGOGASTRODUODENOSCOPY N/A 10/11/2014   RMR:non critical schatzkis ring/HH   EYE SURGERY     LUMBAR LAMINECTOMY     X 2   TONSILLECTOMY     FAMILY HISTORY Family History  Problem Relation Age of Onset   Colon cancer Other        no first degree relatives   Renal cancer Father    Heart attack Father    Glaucoma Mother    Heart failure Mother    SOCIAL HISTORY Social History   Tobacco Use   Smoking status: Never   Smokeless tobacco: Never  Vaping Use   Vaping Use: Never used  Substance Use Topics   Alcohol use: No   Drug use: No       OPHTHALMIC EXAM: Base Eye Exam     Visual Acuity (Snellen - Linear)       Right Left   Dist [place] 20/20 20/20         Tonometry (Tonopen, 1:27 PM)       Right Left   Pressure 14 13         Pupils       Dark Light Shape React APD   Right 3 2 Round Brisk None   Left 3 2 Round Brisk None         Visual Fields (Counting fingers)       Left Right    Full Full         Extraocular Movement       Right Left    Full, Ortho Full, Ortho         Neuro/Psych     Oriented x3: Yes   Mood/Affect: Normal         Dilation     Both eyes: 1.0% Mydriacyl, 2.5% Phenylephrine @ 1:27 PM           Slit Lamp and Fundus Exam     Slit Lamp Exam       Right Left   Lids/Lashes Dermatochalasis - upper lid, Ptosis Dermatochalasis - upper lid, Ptosis   Conjunctiva/Sclera mild temporal Pinguecula mild nasal temporal Pinguecula   Cornea Arcus, 2+Punctate epithelial erosions, Well healed cataract wounds, fine endo pigment Arcus, 2-3+ inferior Punctate epithelial erosions, Well healed cataract wounds, mild endo pigment    Anterior Chamber Deep and quiet Deep and quiet   Iris Round and dilated Round and moderately dilated to 4.67m   Lens PC IOL in good position with open  PC PC IOL in good position with open PC   Vitreous Vitreous syneresis, Posterior vitreous detachment mild Vitreous syneresis, Posterior vitreous detachment, vitreous condensations inferiorly         Fundus Exam       Right Left  Disc Pink and Sharp Pink and Sharp   C/D Ratio 0.3 0.3   Macula Blunted foveal reflex, trace cystic changes/edema -slightly increased; focal cluster of MA/IRH temporal fovea, +focal exudate Flat, good foveal reflex, mild RPE mottling, trace Epiretinal membrane, No heme or edema   Vessels Vascular attenuation, mild tortuosity Vascular attenuation, mild tortuosity   Periphery Attached, no heme Attached, no heme            IMAGING AND PROCEDURES  Imaging and Procedures for _0 @  OCT, Retina - OU - Both Eyes       Right Eye Quality was good. Central Foveal Thickness: 280. Progression has worsened. Findings include no SRF, intraretinal fluid, intraretinal hyper-reflective material, normal foveal contour (Mild interval inc in IRF / cystic changes temporal fovea, persistent focal IRHM).   Left Eye Quality was good. Central Foveal Thickness: 266. Progression has been stable. Findings include normal foveal contour, no IRF, no SRF.   Notes *Images captured and stored on drive  Diagnosis / Impression:  OD: NFP, no SRF, mild interval inc in IRF / cystic changes temporal fovea, persistent focal IRHM OS: NFP, no IRF/SRF   Clinical management:  See below  Abbreviations: NFP - Normal foveal profile. CME - cystoid macular edema. PED - pigment epithelial detachment. IRF - intraretinal fluid. SRF - subretinal fluid. EZ - ellipsoid zone. ERM - epiretinal membrane. ORA - outer retinal atrophy. ORT - outer retinal tubulation. SRHM - subretinal hyper-reflective material            ASSESSMENT/PLAN:    ICD-10-CM   1. Hypertensive retinopathy of both eyes  H35.033     2. Essential hypertension  I10     3. Retinal edema  H35.81 OCT, Retina - OU - Both Eyes     4. Pseudophakia of both eyes  Z96.1      1-3. Hypertensive retinopathy OU  - focal intraretinal hemes in macula OD -- persistent, but improved  - OCT shows slight increase in cystic changes OD, temporal fovea -- ?mild old BRVO  - BCVA remains 20/20 OU  - FA (05.05.21) shows focal MA in macula with late leakage OU    - BP in office measured 167/72 R arm and 147/67 L arm (03.05.21); 147/67 (11.06.2020); 169/73 at prior visit 07.06.2020  - meds adjusted by PCP and cardiology -- BP improved per pt and daughter report  - discussed importance of tight BP control  - monitor for now  - discussed potential for focal laser OD if vision decreases  - f/u 6 months, sooner prn, DFE/OCT  4. Pseudophakia OU  - s/p CE/IOL OU - Dr. Tonny Branch (2015)  - beautiful surgeries, doing well  - monitor  Ophthalmic Meds Ordered this visit:  No orders of the defined types were placed in this encounter.    Return in about 6 months (around 01/24/2022) for HTN retinopathy w/ focal IRH OD , Dilated Exam, OCT.  There are no Patient Instructions on file for this visit.  This document serves as a record of services personally performed by Gardiner Sleeper, MD, PhD. It was created on their behalf by Orvan Falconer, an ophthalmic technician. The creation of this record is the provider's dictation and/or activities during the visit.    Electronically signed by: Orvan Falconer, OA, 07/28/21  1:04 PM   Gardiner Sleeper, M.D., Ph.D. Diseases & Surgery of the Retina and Vitreous [facility]  I have reviewed the above documentation for accuracy and  completeness, and I agree with the above. Gardiner Sleeper, M.D., Ph.D. 07/28/21 1:04 PM  Abbreviations: M myopia (nearsighted); A astigmatism; H hyperopia (farsighted); P presbyopia; Mrx spectacle prescription;  CTL contact lenses; OD right eye; OS left eye; OU both eyes  XT exotropia; ET esotropia; PEK punctate epithelial keratitis; PEE punctate  epithelial erosions; DES dry eye syndrome; MGD meibomian gland dysfunction; ATs artificial tears; PFAT's preservative free artificial tears; [place] nuclear sclerotic cataract; PSC posterior subcapsular cataract; ERM epi-retinal membrane; PVD posterior vitreous detachment; RD retinal detachment; DM diabetes mellitus; DR diabetic retinopathy; NPDR non-proliferative diabetic retinopathy; PDR proliferative diabetic retinopathy; CSME clinically significant macular edema; DME diabetic macular edema; dbh dot blot hemorrhages; CWS cotton wool spot; POAG primary open angle glaucoma; C/D cup-to-disc ratio; HVF humphrey visual field; GVF goldmann visual field; OCT optical coherence tomography; IOP intraocular pressure; BRVO Branch retinal vein occlusion; CRVO central retinal vein occlusion; CRAO central retinal artery occlusion; BRAO branch retinal artery occlusion; RT retinal tear; SB scleral buckle; PPV pars plana vitrectomy; VH Vitreous hemorrhage; PRP panretinal laser photocoagulation; IVK intravitreal kenalog; VMT vitreomacular traction; MH Macular hole;  NVD neovascularization of the disc; NVE neovascularization elsewhere; AREDS age related eye disease study; ARMD age related macular degeneration; POAG primary open angle glaucoma; EBMD epithelial/anterior basement membrane dystrophy; ACIOL anterior chamber intraocular lens; IOL intraocular lens; PCIOL posterior chamber intraocular lens; Phaco/IOL phacoemulsification with intraocular lens placement; [place] photorefractive keratectomy; LASIK laser assisted in situ keratomileusis; HTN hypertension; DM diabetes mellitus; COPD chronic obstructive pulmonary disease

## 2021-07-26 ENCOUNTER — Other Ambulatory Visit: Payer: Self-pay

## 2021-07-26 ENCOUNTER — Encounter (INDEPENDENT_AMBULATORY_CARE_PROVIDER_SITE_OTHER): Payer: Self-pay | Admitting: Ophthalmology

## 2021-07-26 ENCOUNTER — Ambulatory Visit (INDEPENDENT_AMBULATORY_CARE_PROVIDER_SITE_OTHER): Payer: Medicare Other | Admitting: Ophthalmology

## 2021-07-26 VITALS — BP 165/75 | HR 66

## 2021-07-26 DIAGNOSIS — Z961 Presence of intraocular lens: Secondary | ICD-10-CM | POA: Diagnosis not present

## 2021-07-26 DIAGNOSIS — I10 Essential (primary) hypertension: Secondary | ICD-10-CM

## 2021-07-26 DIAGNOSIS — H3581 Retinal edema: Secondary | ICD-10-CM

## 2021-07-26 DIAGNOSIS — H35033 Hypertensive retinopathy, bilateral: Secondary | ICD-10-CM

## 2021-09-05 ENCOUNTER — Other Ambulatory Visit: Payer: Self-pay | Admitting: *Deleted

## 2021-09-05 NOTE — Patient Outreach (Signed)
[place] [facility]) Care Management  09/05/2021  DENEA CHEANEY 24-Jan-1935 [id number]  Unsuccessful outreach attempt made to patient. RN Health Coach left HIPAA compliant voicemail message along with her contact information.  Plan: RN Health Coach will call patient within the month of January.  [name] RN, BSN [place] [phone] [email or web address]

## 2021-09-06 DIAGNOSIS — J329 Chronic sinusitis, unspecified: Secondary | ICD-10-CM | POA: Diagnosis not present

## 2021-09-06 DIAGNOSIS — I10 Essential (primary) hypertension: Secondary | ICD-10-CM | POA: Diagnosis not present

## 2021-09-06 DIAGNOSIS — E782 Mixed hyperlipidemia: Secondary | ICD-10-CM | POA: Diagnosis not present

## 2021-09-06 DIAGNOSIS — U071 COVID-19: Secondary | ICD-10-CM | POA: Diagnosis not present

## 2021-10-06 DIAGNOSIS — E782 Mixed hyperlipidemia: Secondary | ICD-10-CM | POA: Diagnosis not present

## 2021-10-06 DIAGNOSIS — I10 Essential (primary) hypertension: Secondary | ICD-10-CM | POA: Diagnosis not present

## 2021-10-12 ENCOUNTER — Other Ambulatory Visit: Payer: Self-pay | Admitting: *Deleted

## 2021-10-12 NOTE — Patient Outreach (Addendum)
[place] [facility]) Care Management  10/12/2021  Kaitlyn Sanchez 07/07/1935 [id number]  Unsuccessful outreach attempt made to patient. RN Health Coach left HIPAA compliant voicemail message along with her contact information.  Plan: RN Health Coach will call patient within the month of February.  [name] RN, BSN [place] [phone] [email or web address]

## 2021-10-13 ENCOUNTER — Ambulatory Visit (HOSPITAL_COMMUNITY)
Admission: RE | Admit: 2021-10-13 | Discharge: 2021-10-13 | Disposition: A | Discharge status: Home / Self Care | Payer: Medicare Other | Source: Ambulatory Visit | Attending: Family Medicine | Admitting: Family Medicine

## 2021-10-13 ENCOUNTER — Other Ambulatory Visit: Payer: Self-pay

## 2021-10-13 ENCOUNTER — Other Ambulatory Visit (HOSPITAL_COMMUNITY): Payer: Self-pay | Admitting: Family Medicine

## 2021-10-13 DIAGNOSIS — Z6823 Body mass index (BMI) 23.0-23.9, adult: Secondary | ICD-10-CM | POA: Diagnosis not present

## 2021-10-13 DIAGNOSIS — M25512 Pain in left shoulder: Secondary | ICD-10-CM

## 2021-10-13 DIAGNOSIS — M19012 Primary osteoarthritis, left shoulder: Secondary | ICD-10-CM | POA: Diagnosis not present

## 2021-10-13 DIAGNOSIS — M7532 Calcific tendinitis of left shoulder: Secondary | ICD-10-CM | POA: Diagnosis not present

## 2021-12-05 ENCOUNTER — Other Ambulatory Visit: Payer: Self-pay | Admitting: *Deleted

## 2021-12-05 NOTE — Patient Outreach (Addendum)
[place] [facility]) Care Management  12/05/2021  EVANELL REDLICH 1935-03-26 [id number]  Unsuccessful outreach attempt made to patient. RN Health Coach left HIPAA compliant voicemail message along with her contact information.  Plan: RN Health Coach will call patient within the month of April.  [name] RN, BSN [place] [phone] [email or web address]

## 2021-12-11 DIAGNOSIS — E890 Postprocedural hypothyroidism: Secondary | ICD-10-CM | POA: Diagnosis not present

## 2021-12-11 DIAGNOSIS — E871 Hypo-osmolality and hyponatremia: Secondary | ICD-10-CM | POA: Diagnosis not present

## 2021-12-12 LAB — COMPREHENSIVE METABOLIC PANEL
ALT: 16 IU/L (ref 0–32)
AST: 17 IU/L (ref 0–40)
Albumin/Globulin Ratio: 2.3 — ABNORMAL HIGH (ref 1.2–2.2)
Albumin: 4.2 g/dL (ref 3.6–4.6)
Alkaline Phosphatase: 84 IU/L (ref 44–121)
BUN/Creatinine Ratio: 31 — ABNORMAL HIGH (ref 12–28)
BUN: 27 mg/dL (ref 8–27)
Bilirubin Total: 0.2 mg/dL (ref 0.0–1.2)
CO2: 28 mmol/L (ref 20–29)
Calcium: 9.3 mg/dL (ref 8.7–10.3)
Chloride: 101 mmol/L (ref 96–106)
Creatinine, Ser: 0.87 mg/dL (ref 0.57–1.00)
Globulin, Total: 1.8 g/dL (ref 1.5–4.5)
Glucose: 93 mg/dL (ref 70–99)
Potassium: 4 mmol/L (ref 3.5–5.2)
Sodium: 141 mmol/L (ref 134–144)
Total Protein: 6 g/dL (ref 6.0–8.5)
eGFR: 66 mL/min/{1.73_m2} (ref >59)

## 2021-12-12 LAB — T4, FREE: Free T4: 1.88 ng/dL — ABNORMAL HIGH (ref 0.82–1.77)

## 2021-12-12 LAB — TSH: TSH: 1.68 u[IU]/mL (ref 0.450–4.500)

## 2021-12-18 ENCOUNTER — Other Ambulatory Visit: Payer: Self-pay

## 2021-12-18 ENCOUNTER — Ambulatory Visit: Payer: Medicare Other | Admitting: "Endocrinology

## 2021-12-18 ENCOUNTER — Encounter: Payer: Self-pay | Admitting: "Endocrinology

## 2021-12-18 VITALS — BP 150/66 | HR 64 | Ht 62.5 in | Wt 130.6 lb

## 2021-12-18 DIAGNOSIS — E890 Postprocedural hypothyroidism: Secondary | ICD-10-CM

## 2021-12-18 DIAGNOSIS — E871 Hypo-osmolality and hyponatremia: Secondary | ICD-10-CM | POA: Diagnosis not present

## 2021-12-18 MED ORDER — LEVOTHYROXINE SODIUM 100 MCG PO TABS: 100.0000 ug | ORAL_TABLET | Freq: Every day | ORAL | 1 refills | Status: DC

## 2021-12-18 NOTE — Progress Notes (Signed)
12/18/2021, 2:14 PM  Endocrinology follow-up note   Subjective:    Patient ID: Kaitlyn Sanchez, female    DOB: July 08, 1935, PCP [name], MD   Past Medical History:  Diagnosis Date   Essential hypertension    GERD (gastroesophageal reflux disease)    History of cardiac catheterization 2013   Mild coronary atherosclerosis - [place]   Hyperlipidemia    Hypertensive retinopathy    Hypothyroidism    Past Surgical History:  Procedure Laterality Date   APPENDECTOMY     CARPAL TUNNEL RELEASE     CATARACT EXTRACTION W/PHACO Left 12/28/2013   Procedure: CATARACT EXTRACTION PHACO AND INTRAOCULAR LENS PLACEMENT ([place]);  Surgeon: Tonny Branch, MD;  Location: [facility];  Service: Ophthalmology;  Laterality: Left;  CDE 18.64   CATARACT EXTRACTION W/PHACO Right 01/07/2014   Procedure: CATARACT EXTRACTION PHACO AND INTRAOCULAR LENS PLACEMENT (IOC);  Surgeon: Tonny Branch, MD;  Location: [facility];  Service: Ophthalmology;  Laterality: Right;  CDE:11.01   COLONOSCOPY     ESOPHAGOGASTRODUODENOSCOPY N/A 10/11/2014   RMR:non critical schatzkis ring/HH   EYE SURGERY     LUMBAR LAMINECTOMY     X 2   TONSILLECTOMY     Social History   Socioeconomic History   Marital status: Widowed    Spouse name: Not on file   Number of children: 2   Years of education: Not on file   Highest education level: Not on file  Occupational History   Occupation: retired    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 [place]  Tobacco Use   Smoking status: Never   Smokeless tobacco: Never  Vaping Use   Vaping Use: Never used  Substance and Sexual Activity   Alcohol use: No   Drug use: No   Sexual activity: Not Currently  Other Topics Concern   Not on file  Social History Narrative   Not on file   Social Determinants of Health   Financial Resource Strain: Not on file  Food Insecurity: Not on file  Transportation Needs: Not on file  Physical Activity: Not on file   Stress: Not on file  Social Connections: Not on file   Family History  Problem Relation Age of Onset   Colon cancer Other        no first degree relatives   Renal cancer Father    Heart attack Father    Glaucoma Mother    Heart failure Mother    Outpatient Encounter Medications as of 12/18/2021  Medication Sig   amLODipine (NORVASC) 10 MG tablet Take 10 mg by mouth every evening.    aspirin EC 81 MG tablet Take 243 mg by mouth at bedtime.   Calcium Carbonate-Vit D-Min (CALTRATE 600+D PLUS PO) Take 1 tablet by mouth daily.   Chelated Potassium 99 MG TABS Take 1 tablet by mouth daily.   Cholecalciferol (VITAMIN D3) 125 MCG (5000 UT) CAPS Take 1 capsule by mouth daily.   docusate sodium (COLACE) 100 MG capsule Take 300 mg by mouth at bedtime.    Flaxseed, Linseed, (FLAXSEED OIL) 1000 MG CAPS Take 1,000 mg by mouth daily.    glucosamine-chondroitin 500-400 MG tablet Take 1 tablet by mouth daily.   labetalol (NORMODYNE) 100 MG tablet  Take 1 tablet by mouth 3 (three) times daily.   levothyroxine (SYNTHROID) 100 MCG tablet Take 1 tablet (100 mcg total) by mouth daily before breakfast.   LORazepam (ATIVAN) 1 MG tablet Take 0.5 mg by mouth 2 (two) times daily.   multivitamin-iron-minerals-folic acid (CENTRUM) chewable tablet Chew 1 tablet by mouth daily.   olmesartan (BENICAR) 40 MG tablet Take 40 mg by mouth daily.   Omega 3 1200 MG CAPS Take 1,200 mg by mouth daily.    vitamin B-12 (CYANOCOBALAMIN) 1000 MCG tablet Take 1,000 mcg by mouth daily.   vitamin C (ASCORBIC ACID) 500 MG tablet Take 500 mg by mouth daily.   zinc gluconate 50 MG tablet Take 50 mg by mouth daily.   [DISCONTINUED] levothyroxine (SYNTHROID) 112 MCG tablet Take 112 mcg by mouth daily.   No facility-administered encounter medications on file as of 12/18/2021.   ALLERGIES: Allergies  Allergen Reactions   Levaquin [Levofloxacin] Other (See Comments)    Seizures    VACCINATION STATUS:  There is no immunization  history on file for this patient.  HPI Kaitlyn Sanchez is 87 y.o. female who presents today for a follow-up in the management of hypothyroidism and hyponatremia.  She is known to have longstanding hypothyroidism and hyponatremia associated with SIADH.    She continues to do well on fluid restrictions, currently drinking approximately 800 cc daily.  Her previsit labs show sodium stable at 141. She has no new complaints today.    -She denies craving for salt.  She denies any prior adrenal, pituitary, or pancreatic problems.   She denies any history of head injury, nor any history of malignancy.   -She reports reports compliance with her levothyroxine, currently 112 mcg p.o. daily. -Her hypothyroidism is related to her remote past  radioactive iodine ablation approximately at age 40.  She is on a stable dose of levothyroxine  with no complaints.  Her previsit thyroid function tests are consistent with appropriate replacement.    She has a steady weight.  She has hypertension better controlled than last visit.  She is on 3 medications including amlodipine, Benicar and labetalol.    Review of Systems Limited as above.  Objective:    Vitals with BMI 12/18/2021 07/26/2021 06/19/2021  Height 5' 2.5" - 5' 2.5"  Weight 130 lbs 10 oz - 131 lbs 6 oz  BMI 88.41 - 66.06  Systolic 301 601 093  Diastolic 66 75 76  Pulse 64 66 56    BP (!) 150/66    Pulse 64    Ht 5' 2.5" (1.588 m)    Wt 130 lb 9.6 oz (59.2 kg)    BMI 23.51 kg/m   Wt Readings from Last 3 Encounters:  12/18/21 130 lb 9.6 oz (59.2 kg)  06/19/21 131 lb 6.4 oz (59.6 kg)  01/16/21 126 lb (57.2 kg)    Physical Exam   CMP ( most recent) CMP     Component Value Date/Time   NA 141 12/11/2021 0831   K 4.0 12/11/2021 0831   CL 101 12/11/2021 0831   CO2 28 12/11/2021 0831   GLUCOSE 93 12/11/2021 0831   GLUCOSE 93 03/19/2020 0727   BUN 27 12/11/2021 0831   CREATININE 0.87 12/11/2021 0831   CREATININE 0.62 08/19/2015 1237   CALCIUM  9.3 12/11/2021 0831   PROT 6.0 12/11/2021 0831   ALBUMIN 4.2 12/11/2021 0831   AST 17 12/11/2021 0831   ALT 16 12/11/2021 0831   ALKPHOS 84 12/11/2021 0831  BILITOT 0.2 12/11/2021 0831   GFRNONAA 72 06/07/2020 0833   GFRAA 83 06/07/2020 0833     Lab Results  Component Value Date   TSH 1.680 12/11/2021   TSH 1.840 06/09/2021   TSH 1.530 12/06/2020   TSH 1.370 06/07/2020   TSH 0.764 03/18/2020   TSH 12.285 (H) 02/24/2019   FREET4 1.88 (H) 12/11/2021   FREET4 1.75 06/09/2021   FREET4 1.71 12/06/2020   FREET4 1.74 06/07/2020      Assessment & Plan:   1. Hyponatremia/SIADH 2. Hypothyroidism following radioiodine therapy  -Her previsit CMP showed sodium stable at between 140 and 145.    Serum and urine osmolality is appropriate at this time. -Review of her current and prior hospital records indicate high likelihood of SIADH as a cause of her hyponatremia.  -Treatment approach was discussed again in detail with her and her daughter.    -I have reemphasized that mainstay of therapy is being water striction.   -  I advised her to continue on water restrictions, total daily fluid intake of 800 cc.    She is allowed to consume half of her fluid in the form of free water, and  other half in various liquid forms including electrolytes, juice, milk, coffee, etc. she may add another 100 cc of water for the warmer season.  -Patients with subacute hyponatremia from SIADH tend  to have a lower hypothalamic setpoint for osmostat, hence,  correction of sodium into the 140s is unnecessary.  - They generally tend to do well with sodium in the low normal range between 130-135 mmol/L.  She is allowed to consume ten more  ounce of fluid daily during warm seasons.  -She is advised to call clinic if she started to have symptoms including lightheadedness, seizures.  -Regarding her hypothyroidism: Her previsit thyroid function tests are consistent with slight over replacement.  I discussed and lowered  her levothyroxine to 100 mcg p.o. daily before breakfast.     - We discussed about the correct intake of her thyroid hormone, on empty stomach at fasting, with water, separated by at least 30 minutes from breakfast and other medications,  and separated by more than 4 hours from calcium, iron, multivitamins, acid reflux medications (PPIs). -Patient is made aware of the fact that thyroid hormone replacement is needed for life, dose to be adjusted by periodic monitoring of thyroid function tests.   - I did not initiate any new prescriptions today. - she is advised to maintain close follow up with [name], MD for primary care needs.    I spent 21 minutes in the care of the patient today including review of labs from Thyroid Function, CMP, and other relevant labs ; imaging/biopsy records (current and previous including abstractions from other facilities); face-to-face time discussing  her lab results and symptoms, medications doses, her options of short and long term treatment based on the latest standards of care / guidelines;   and documenting the encounter.  Kaitlyn Sanchez  participated in the discussions, expressed understanding, and voiced agreement with the above plans.  All questions were answered to her satisfaction. she is encouraged to contact clinic should she have any questions or concerns prior to her return visit.   Follow up plan: Return in about 6 months (around 06/20/2022) for F/U with Pre-visit Labs.   Glade Lloyd, MD [facility] [address] [place], [place] [address] Phone: [phone]  Fax: [phone]     12/18/2021, 2:14  PM  This note was partially dictated with voice recognition software. Similar sounding words can be transcribed inadequately or may not  be corrected upon review.

## 2021-12-19 ENCOUNTER — Telehealth: Payer: Self-pay | Admitting: "Endocrinology

## 2021-12-19 DIAGNOSIS — E890 Postprocedural hypothyroidism: Secondary | ICD-10-CM

## 2021-12-19 MED ORDER — LEVOTHYROXINE SODIUM 100 MCG PO TABS: 100.0000 ug | ORAL_TABLET | Freq: Every day | ORAL | 1 refills | Status: DC

## 2021-12-19 NOTE — Telephone Encounter (Signed)
[facility] ?

## 2021-12-19 NOTE — Telephone Encounter (Signed)
Patient said her Thyroid Medication went to the wrong pharmacy. She needs it to go to [facility] on [address] ?

## 2022-01-12 ENCOUNTER — Other Ambulatory Visit: Payer: Self-pay | Admitting: *Deleted

## 2022-01-12 NOTE — Patient Outreach (Signed)
[place] [facility]) Care Management ? ?01/12/2022 ? ?Kaitlyn Sanchez ?1935-09-23 ?[id number] ? ?Unsuccessful outreach attempt made to patient. RN Health Coach left HIPAA compliant voicemail message along with her contact information. ? ?Plan: ?RN Health Coach will call patient within the month of May. ? ?[name] RN, BSN ?[facility] Care Management  ?RN Health Coach ?[phone] ?[email or web address] ? ? ?

## 2022-01-15 ENCOUNTER — Encounter: Payer: Self-pay | Admitting: *Deleted

## 2022-01-15 ENCOUNTER — Other Ambulatory Visit: Payer: Self-pay | Admitting: *Deleted

## 2022-01-15 NOTE — Telephone Encounter (Signed)
This encounter was created in error - please disregard.

## 2022-01-16 DIAGNOSIS — Z1283 Encounter for screening for malignant neoplasm of skin: Secondary | ICD-10-CM | POA: Diagnosis not present

## 2022-01-16 DIAGNOSIS — Z08 Encounter for follow-up examination after completed treatment for malignant neoplasm: Secondary | ICD-10-CM | POA: Diagnosis not present

## 2022-01-16 DIAGNOSIS — Z85828 Personal history of other malignant neoplasm of skin: Secondary | ICD-10-CM | POA: Diagnosis not present

## 2022-01-16 NOTE — Patient Outreach (Signed)
[facility]) Care Management ? ?01/16/2022 ? ?Adele Dan ?07/25/1935 ?[id number] ? ?[facility]) Care Management ?RN Health Coach Note ? ? ?01/16/2022 ?Name:  Kaitlyn Sanchez MRN:  [id number] DOB:  June 30, 1935 ? ?Summary: ?Patient states she is doing well. She had questions regarding her hypertension medications and how best to take them. Patient shared that she has not been taking her B/P because it stresses her out when it is not good. Nurse reviewed the patients medications and educated about the importance of monitoring her B/P for her health and wellness. Nurse sent patient written education regarding how to manage your hypertension. Patient states that her home environment is safe and that she is well supported by her family. Patient did not have any further questions or concerns today and did confirm that she has this nurse's contact number to call her if needed.  ? ?Recommendations/Changes made from today's visit: ?Call provider office for new concerns or questions  ?check blood pressure 3 times per week ?write blood pressure results in a log or diary ?take blood pressure log to all doctor appointments ?Continue to stay active by walking and exercising with bands ?Continue to work with Dr. Dorris Fetch to keep your hyponatremia under control ? ?Subjective: ?Kaitlyn Sanchez is an 87 y.o. year old female who is a primary patient of [name], MD. The care management team was consulted for assistance with care management and/or care coordination needs.   ? ?RN Health Coach completed Telephone Visit today.  ? ?Objective: ? ?Medications Reviewed Today   ? ? Reviewed by Michiel Cowboy, RN (Registered Nurse) on 01/15/22 at 1516  Med List Status: <None>  ? ?Medication Order Taking? Sig Documenting Provider Last Dose Status Informant  ?amLODipine (NORVASC) 10 MG tablet [id number] Yes Take 10 mg by mouth every evening.  [provider] Taking Active Self  ?         ?Med Note Loman Chroman,  AMY W   Mon Jan 16, 2021  8:44 AM)    ?aspirin EC 81 MG tablet [id number] Yes Take 243 mg by mouth at bedtime. [provider] Taking Active Self  ?Calcium Carbonate-Vit D-Min (CALTRATE 600+D PLUS PO) [id number] Yes Take 1 tablet by mouth daily. [provider] Taking Active Self  ?Chelated Potassium 99 MG TABS [id number] Yes Take 1 tablet by mouth daily. [provider] Taking Active Self  ?Cholecalciferol (VITAMIN D3) 125 MCG (5000 UT) CAPS [id number] Yes Take 1 capsule by mouth daily. [provider] Taking Active   ?docusate sodium (COLACE) 100 MG capsule [id number] Yes Take 300 mg by mouth at bedtime.  [provider] Taking Active Self  ?Flaxseed, Linseed, (FLAXSEED OIL) 1000 MG CAPS [id number] Yes Take 1,000 mg by mouth daily.  [provider] Taking Active Self  ?glucosamine-chondroitin 500-400 MG tablet [id number] Yes Take 1 tablet by mouth daily. [provider] Taking Active Self  ?labetalol (NORMODYNE) 100 MG tablet [id number] Yes Take 1 tablet by mouth 3 (three) times daily. [provider] Taking Active Self  ?         ?Med Note Loman Chroman, AMY W   Mon Jan 16, 2021  8:44 AM)    ?levothyroxine (SYNTHROID) 100 MCG tablet [id number] Yes Take 1 tablet (100 mcg total) by mouth daily before breakfast. Cassandria Anger, MD Taking Active   ?LORazepam (ATIVAN) 1 MG tablet [id number] Yes Take 0.5 mg by mouth 2 (two) times daily. [provider] Taking Active Self  ?multivitamin-iron-minerals-folic  acid (CENTRUM) chewable tablet [id number] Yes Chew 1 tablet by mouth daily. [provider] Taking Active Self  ?olmesartan (BENICAR) 40 MG tablet [id number] Yes Take 40 mg by mouth daily. [provider] Taking Active Self  ?Omega 3 1200 MG CAPS [id number] Yes Take 1,200 mg by mouth daily.  [provider] Taking Active Self  ?vitamin B-12 (CYANOCOBALAMIN) 1000 MCG tablet [id number] Yes Take 1,000 mcg by mouth daily. [provider] Taking Active Self  ?vitamin C (ASCORBIC ACID) 500 MG tablet [id number] Yes Take 500 mg by mouth daily. [provider] Taking Active Self  ?zinc gluconate 50 MG tablet [id number] Yes Take 50 mg by mouth daily. [provider] Taking Active Self  ? ?  ?  ? ?  ? ? ? ?SDOH:  (Social Determinants of Health) assessments and interventions performed: SDOH assessments completed today and documented in the Epic system. ? ?Care Plan ? ?Review of patient past medical history, allergies, medications, health status, including review of consultants reports, laboratory and other test data, was performed as part of comprehensive evaluation for care management services.  ? ?Care Plan : Hypertension (Adult)  ?Updates made by Michiel Cowboy, RN since 01/16/2022 12:00 AM  ?  ? ?Problem: Hypertension (Hypertension) Resolved 01/15/2022  ?Priority: High  ?  ? ?Goal: Hypertension Monitored Completed 01/15/2022  ?Start Date: 11/17/2020  ?Expected End Date: 11/17/2021  ?Note:   ?Resolving due to duplicate goal ? ?Evidence-based guidance:  ?Promote initial use of ambulatory blood pressure measurements (for 3 days) to rule out "white-coat" effect; identify masked hypertension and presence or absence of nocturnal "dipping" of blood pressure.   ?Encourage continued use of home blood pressure monitoring and recording in blood pressure log; include symptoms of hypotension or potential medication side effects in log.  ?Review blood pressure measurements taken inside and outside of the provider office; establish baseline and monitor trends; compare to target ranges or patient goal.  ?Share overall cardiovascular risk with patient; encourage changes to lifestyle risk factors, including alcohol consumption, smoking, inadequate exercise, poor dietary habits and stress.   ?Notes:  ?  ? ?Task: Identify and Monitor Blood Pressure Elevation Completed 01/16/2022  ?Due Date: 11/17/2021  ?Note:   ?Care Management Activities:  ?  ?-  blood pressure trends reviewed ?- home or ambulatory blood pressure monitoring encouraged  ?  ?Notes:  ?  ? ?Problem: Disease Progression (Hypertension) Resolved 01/15/2022  ?Priority: High  ?Note:   ?Resolving due to duplicate goal ? ?  ? ?Long-Range Goal: Disease Progression Prevented or Minimized Completed 01/15/2022  ?Start Date: 11/17/2020  ?Expected End Date: 11/17/2021  ?Note:   ?Resolving due to duplicate goal ? ?Evidence-based guidance:  ?Tailor lifestyle advice to individual; review progress regularly; give frequent encouragement and respond positively to incremental successes.  ?Assess for and promote awareness of worsening disease or development of comorbidity.  ?Prepare patient for laboratory and diagnostic exams based on risk and presentation.  ?Prepare patient for use of pharmacologic therapy that may include diuretic, beta-blocker, beta-blocker/thiazide combination, angiotensin-converting enzyme inhibitor, renin-angiotensin blocker or calcium-channel blocker.  ?Expect periodic adjustments to pharmacologic therapy; manage side effects.  ?Promote a healthy diet that includes primarily plant-based foods, such as fruits, vegetables, whole grains, beans and legumes, low-fat dairy and lean meats.   ?Consider moderate reduction in sodium intake by avoiding the addition of salt to prepared foods and limiting processed meats, canned soup, frozen meals and salty snacks.   ?Promote a regular, daily exercise goal of 150  minutes per week of moderate exercise based on tolerance, ability and patient choice; consider referral to physical therapist, community wellness and/or activity program.  ?Encourage the avoidance of no more than 2 hours per day of sedentary activity, such as recreational screen time.  ?Review sources of stress; explore current coping strategies and encourage use of mindfulness, yoga, meditation or exercise to manage stress.   ?Notes:  ?  ? ?Task: Alleviate Barriers to Hypertension Treatment  Completed 01/16/2022  ?Due Date: 11/17/2021  ?Note:   ?Care Management Activities:  ?  ?- healthy diet promoted ?- reduction of dietary sodium encouraged ?- reduction in sedentary activities encouraged  ?  ?Notes:

## 2022-01-16 NOTE — Patient Instructions (Signed)
Visit Information ? ?Thank you for taking time to visit with me today. Please don't hesitate to contact me if I can be of assistance to you before our next scheduled telephone appointment. ? ?Following are the goals we discussed today:  ?Patient Goals/Self-Care Activities: ?Take all medications as prescribed ?Attend all scheduled provider appointments ?Call provider office for new concerns or questions  ?check blood pressure 3 times per week ?write blood pressure results in a log or diary ?take blood pressure log to all doctor appointments ?Continue to stay active by walking and exercising with bands ?Continue to work with Dr. Dorris Fetch to keep your hyponatremia under control ? ?The patient verbalized understanding of instructions, educational materials, and care plan provided today and agreed to receive a mailed copy of patient instructions, educational materials, and care plan.  ? ?Telephone follow up appointment with care management team member scheduled for: July ? ?[name] RN, BSN ?Nurse Health Coach ?[facility] ?[phone] ?[email or web address] ? ? ?  ?

## 2022-01-19 NOTE — Progress Notes (Signed)
?[facility] Note ? ?01/24/2022 ? ?  ? ?CHIEF COMPLAINT ?Patient presents for Retina Follow Up ? ? ? ?HISTORY OF PRESENT ILLNESS: ?Kaitlyn Sanchez is a 87 y.o. female who presents to the clinic today for:  ? ?HPI   ? ? Retina Follow Up   ?Patient presents with  Other (Hypertensive Retinopathy OU).  In both eyes.  This started years ago.  Severity is mild.  Duration of 6 months.  Since onset it is stable.  I, the attending physician,  performed the HPI with the patient and updated documentation appropriately. ? ?  ?  ? ? Comments   ?Patient feels that the vision has remained stable since her last visit 6 months ago. She states that her BP is under control with medication.  ? ?  ?  ?Last edited by Bernarda Caffey, MD on 01/24/2022 11:22 PM.  ?  ?Pt states vision is stable, she states her BP is 620 diastolic when checked at home, in office today it is 185/71 ? ?Referring physician: ?[name], MD ?[address] ?[place],  [place] [address] ? ?HISTORICAL INFORMATION:  ? ?Selected notes from the [facility] ?Referred by Dr. Madelin Headings for concern of diabetic retinopathy ?LEE: 03.11.20 (M. 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 Tonny Branch, 2015), HTN ret, vitreous degeneration ?PMH-HTN, HLD, hypothyroidism ?  ? ?CURRENT MEDICATIONS: ?No current outpatient medications on file. (Ophthalmic Drugs)  ? ?No current facility-administered medications for this visit. (Ophthalmic Drugs)  ? ?Current Outpatient Medications (Other)  ?Medication Sig  ? amLODipine (NORVASC) 10 MG tablet Take 10 mg by mouth every evening.   ? aspirin EC 81 MG tablet Take 243 mg by mouth at bedtime.  ? Calcium Carbonate-Vit D-Min (CALTRATE 600+D PLUS PO) Take 1 tablet by mouth daily.  ? Chelated Potassium 99 MG TABS Take 1 tablet by mouth daily.  ? Cholecalciferol (VITAMIN D3) 125 MCG (5000 UT) CAPS Take 1 capsule by mouth daily.  ? docusate sodium (COLACE) 100 MG capsule Take 300 mg by  mouth at bedtime.   ? Flaxseed, Linseed, (FLAXSEED OIL) 1000 MG CAPS Take 1,000 mg by mouth daily.   ? glucosamine-chondroitin 500-400 MG tablet Take 1 tablet by mouth daily.  ? labetalol (NORMODYNE) 100 MG tablet Take 1 tablet by mouth 3 (three) times daily.  ? levothyroxine (SYNTHROID) 100 MCG tablet Take 1 tablet (100 mcg total) by mouth daily before breakfast.  ? LORazepam (ATIVAN) 1 MG tablet Take 0.5 mg by mouth 2 (two) times daily.  ? multivitamin-iron-minerals-folic acid (CENTRUM) chewable tablet Chew 1 tablet by mouth daily.  ? olmesartan (BENICAR) 40 MG tablet Take 40 mg by mouth daily.  ? Omega 3 1200 MG CAPS Take 1,200 mg by mouth daily.   ? vitamin B-12 (CYANOCOBALAMIN) 1000 MCG tablet Take 1,000 mcg by mouth daily.  ? vitamin C (ASCORBIC ACID) 500 MG tablet Take 500 mg by mouth daily.  ? zinc gluconate 50 MG tablet Take 50 mg by mouth daily.  ? ?No current facility-administered medications for this visit. (Other)  ? ?REVIEW OF SYSTEMS: ?ROS   ?Positive for: Gastrointestinal, Eyes, Respiratory ?Negative for: Constitutional, Neurological, Skin, Genitourinary, Musculoskeletal, HENT, Endocrine, Cardiovascular, Psychiatric, Allergic/Imm, Heme/Lymph ?Last edited by Annie Paras, COT on 01/24/2022  1:12 PM.  ?  ? ?ALLERGIES ?Allergies  ?Allergen Reactions  ? Levaquin [Levofloxacin] Other (See Comments)  ?  Seizures  ? ?PAST MEDICAL HISTORY ?Past Medical History:  ?Diagnosis  Date  ? Essential hypertension   ? GERD (gastroesophageal reflux disease)   ? History of cardiac catheterization 2013  ? Mild coronary atherosclerosis - [facility]  ? Hyperlipidemia   ? Hypertensive retinopathy   ? Hypothyroidism   ? ?Past Surgical History:  ?Procedure Laterality Date  ? APPENDECTOMY    ? CARPAL TUNNEL RELEASE    ? CATARACT EXTRACTION W/PHACO Left 12/28/2013  ? Procedure: CATARACT EXTRACTION PHACO AND INTRAOCULAR LENS PLACEMENT (IOC);  Surgeon: Tonny Branch, MD;  Location: [facility];  Service: Ophthalmology;  Laterality:  Left;  CDE 18.64  ? CATARACT EXTRACTION W/PHACO Right 01/07/2014  ? Procedure: CATARACT EXTRACTION PHACO AND INTRAOCULAR LENS PLACEMENT (IOC);  Surgeon: Tonny Branch, MD;  Location: [facility];  Service: Ophthalmology;  Laterality: Right;  CDE:11.01  ? COLONOSCOPY    ? ESOPHAGOGASTRODUODENOSCOPY N/A 10/11/2014  ? RMR:non critical schatzkis ring/HH  ? EYE SURGERY    ? LUMBAR LAMINECTOMY    ? X 2  ? TONSILLECTOMY    ? ?FAMILY HISTORY ?Family History  ?Problem Relation Age of Onset  ? Colon cancer Other   ?     no first degree relatives  ? Renal cancer Father   ? Heart attack Father   ? Glaucoma Mother   ? Heart failure Mother   ? ?SOCIAL HISTORY ?Social History  ? ?Tobacco Use  ? Smoking status: Never  ? Smokeless tobacco: Never  ?Vaping Use  ? Vaping Use: Never used  ?Substance Use Topics  ? Alcohol use: No  ? Drug use: No  ?  ? ?  ?OPHTHALMIC EXAM: ?Base Eye Exam   ? ? Visual Acuity (Snellen - Linear)   ? ?   Right Left  ? Dist [place] 20/20 20/20  ? ?  ?  ? ? Tonometry (Tonopen, 1:16 PM)   ? ?   Right Left  ? Pressure 14 14  ? ?  ?  ? ? Pupils   ? ?   Pupils Dark Light Shape React APD  ? Right PERRL 3 2 Round Brisk None  ? Left PERRL 3 2 Round Brisk None  ? ?  ?  ? ? Visual Fields   ? ?   Left Right  ?  Full Full  ? ?  ?  ? ? Extraocular Movement   ? ?   Right Left  ?  Full, Ortho Full, Ortho  ? ?  ?  ? ? Neuro/Psych   ? ? Oriented x3: Yes  ? Mood/Affect: Normal  ? ?  ?  ? ? Dilation   ? ? Both eyes: 1.0% Mydriacyl, 2.5% Phenylephrine @ 1:14 PM  ? ?  ?  ? ?  ? ?Slit Lamp and Fundus Exam   ? ? Slit Lamp Exam   ? ?   Right Left  ? Lids/Lashes Dermatochalasis - upper lid, Ptosis Dermatochalasis - upper lid, Ptosis  ? Conjunctiva/Sclera mild temporal Pinguecula mild nasal temporal Pinguecula  ? Cornea Arcus, trace Punctate epithelial erosions, Well healed cataract wounds, fine endo pigment Arcus, trace inferior Punctate epithelial erosions, Well healed cataract wounds, mild endo pigment  ? Anterior Chamber Deep and quiet Deep and  quiet  ? Iris Round and dilated Round and moderately dilated to 4.40m  ? Lens PC IOL in good position with open PC PC IOL in good position with open PC  ? Anterior Vitreous Vitreous syneresis, Posterior vitreous detachment mild Vitreous syneresis, Posterior vitreous detachment, vitreous condensations inferiorly  ? ?  ?  ? ?  Fundus Exam   ? ?   Right Left  ? Disc Pink and Sharp Pink and Sharp  ? C/D Ratio 0.3 0.3  ? Macula Blunted foveal reflex, trace cystic changes/edema - slightly improved; focal cluster of MA/IRH temporal fovea, +focal exudate - improved Flat, good foveal reflex, mild RPE mottling, trace Epiretinal membrane, rare MA, No edema  ? Vessels Vascular attenuation, mild tortuosity Vascular attenuation, mild tortuosity  ? Periphery Attached, no heme Attached, no heme  ? ?  ?  ? ?  ? ?IMAGING AND PROCEDURES  ?Imaging and Procedures for _0 @ ? ?OCT, Retina - OU - Both Eyes   ? ?   ?Right Eye ?Quality was good. Central Foveal Thickness: 272. Progression has improved. Findings include no SRF, intraretinal fluid, intraretinal hyper-reflective material, normal foveal contour (Mild interval improvement in IRF / cystic changes temporal fovea, persistent focal IRHM).  ? ?Left Eye ?Quality was good. Central Foveal Thickness: 267. Progression has been stable. Findings include normal foveal contour, no IRF, no SRF.  ? ?Notes ?*Images captured and stored on drive ? ?Diagnosis / Impression:  ?OD: NFP, no SRF, Mild interval improvement in IRF / cystic changes temporal fovea, persistent focal IRHM ?OS: NFP, no IRF/SRF ? ? ?Clinical management:  ?See below ? ?Abbreviations: NFP - Normal foveal profile. CME - cystoid macular edema. PED - pigment epithelial detachment. IRF - intraretinal fluid. SRF - subretinal fluid. EZ - ellipsoid zone. ERM - epiretinal membrane. ORA - outer retinal atrophy. ORT - outer retinal tubulation. SRHM - subretinal hyper-reflective material ? ? ?  ?  ?  ? ?  ?ASSESSMENT/PLAN: ? ?  ICD-10-CM    ?1. Hypertensive retinopathy of both eyes  H35.033 OCT, Retina - OU - Both Eyes  ?  ?2. Essential hypertension  I10   ?  ?3. Retinal hemorrhage of right eye  H35.61   ?  ?4. Pseudophakia of both eyes  Z96.

## 2022-01-24 ENCOUNTER — Encounter (INDEPENDENT_AMBULATORY_CARE_PROVIDER_SITE_OTHER): Payer: Self-pay | Admitting: Ophthalmology

## 2022-01-24 ENCOUNTER — Ambulatory Visit (INDEPENDENT_AMBULATORY_CARE_PROVIDER_SITE_OTHER): Payer: Medicare Other | Admitting: Ophthalmology

## 2022-01-24 VITALS — BP 185/71 | HR 63

## 2022-01-24 DIAGNOSIS — I10 Essential (primary) hypertension: Secondary | ICD-10-CM | POA: Diagnosis not present

## 2022-01-24 DIAGNOSIS — H3561 Retinal hemorrhage, right eye: Secondary | ICD-10-CM

## 2022-01-24 DIAGNOSIS — H35033 Hypertensive retinopathy, bilateral: Secondary | ICD-10-CM

## 2022-01-24 DIAGNOSIS — Z961 Presence of intraocular lens: Secondary | ICD-10-CM | POA: Diagnosis not present

## 2022-03-07 ENCOUNTER — Ambulatory Visit: Payer: Self-pay | Admitting: *Deleted

## 2022-03-15 ENCOUNTER — Other Ambulatory Visit (HOSPITAL_COMMUNITY): Payer: Self-pay | Admitting: Family Medicine

## 2022-03-15 DIAGNOSIS — Z1231 Encounter for screening mammogram for malignant neoplasm of breast: Secondary | ICD-10-CM

## 2022-03-21 ENCOUNTER — Telehealth: Payer: Self-pay | Admitting: "Endocrinology

## 2022-03-21 ENCOUNTER — Other Ambulatory Visit: Payer: Self-pay | Admitting: "Endocrinology

## 2022-03-21 DIAGNOSIS — E890 Postprocedural hypothyroidism: Secondary | ICD-10-CM

## 2022-03-21 DIAGNOSIS — E871 Hypo-osmolality and hyponatremia: Secondary | ICD-10-CM | POA: Diagnosis not present

## 2022-03-21 NOTE — Telephone Encounter (Signed)
Pt will go this afternoon. Can you place another lab order for her sept appt

## 2022-03-21 NOTE — Telephone Encounter (Signed)
Patient said ever since her thyroid medication was decreased from 112 to 100 she has been very fatigue, dry hair, puffy and constipated. She just does not feel well and this is the only change in medication that she has had. Do you want her to repeat labs?

## 2022-03-22 LAB — COMPREHENSIVE METABOLIC PANEL
ALT: 20 IU/L (ref 0–32)
AST: 20 IU/L (ref 0–40)
Albumin/Globulin Ratio: 2.5 — ABNORMAL HIGH (ref 1.2–2.2)
Albumin: 4.2 g/dL (ref 3.6–4.6)
Alkaline Phosphatase: 93 IU/L (ref 44–121)
BUN/Creatinine Ratio: 32 — ABNORMAL HIGH (ref 12–28)
BUN: 24 mg/dL (ref 8–27)
Bilirubin Total: <0.2 mg/dL (ref 0.0–1.2)
CO2: 26 mmol/L (ref 20–29)
Calcium: 9.1 mg/dL (ref 8.7–10.3)
Chloride: 96 mmol/L (ref 96–106)
Creatinine, Ser: 0.76 mg/dL (ref 0.57–1.00)
Globulin, Total: 1.7 g/dL (ref 1.5–4.5)
Glucose: 92 mg/dL (ref 70–99)
Potassium: 4.6 mmol/L (ref 3.5–5.2)
Sodium: 136 mmol/L (ref 134–144)
Total Protein: 5.9 g/dL — ABNORMAL LOW (ref 6.0–8.5)
eGFR: 77 mL/min/{1.73_m2} (ref >59)

## 2022-03-22 LAB — TSH: TSH: 3.12 u[IU]/mL (ref 0.450–4.500)

## 2022-03-22 LAB — T4, FREE: Free T4: 1.48 ng/dL (ref 0.82–1.77)

## 2022-03-22 NOTE — Telephone Encounter (Signed)
Pt did her labs yesterday and would like someone to give her a call to discuss results

## 2022-03-22 NOTE — Telephone Encounter (Signed)
Discussed with pt, understanding voiced. 

## 2022-04-17 ENCOUNTER — Other Ambulatory Visit: Payer: Self-pay | Admitting: *Deleted

## 2022-04-17 DIAGNOSIS — Z0001 Encounter for general adult medical examination with abnormal findings: Secondary | ICD-10-CM | POA: Diagnosis not present

## 2022-04-17 DIAGNOSIS — M25512 Pain in left shoulder: Secondary | ICD-10-CM | POA: Diagnosis not present

## 2022-04-17 DIAGNOSIS — I10 Essential (primary) hypertension: Secondary | ICD-10-CM | POA: Diagnosis not present

## 2022-04-17 DIAGNOSIS — Z6822 Body mass index (BMI) 22.0-22.9, adult: Secondary | ICD-10-CM | POA: Diagnosis not present

## 2022-04-17 DIAGNOSIS — E782 Mixed hyperlipidemia: Secondary | ICD-10-CM | POA: Diagnosis not present

## 2022-04-17 DIAGNOSIS — E039 Hypothyroidism, unspecified: Secondary | ICD-10-CM | POA: Diagnosis not present

## 2022-04-17 NOTE — Patient Outreach (Signed)
  Care Coordination   Follow Up Visit Note   04/17/2022 Name: Kaitlyn Sanchez MRN: [id number] DOB: Oct 29, 1934  Kaitlyn Sanchez is a 87 y.o. year old female who sees [name], MD for primary care. Unsuccessful outreach attempt made to patient. Nurse left HIPAA compliant voicemail message along with her contact information.  Plan: RN Health Coach will call patient within the month of August.  [name] RN, BSN [facility] Care Management  [phone] [email or web address]

## 2022-04-18 ENCOUNTER — Ambulatory Visit (HOSPITAL_COMMUNITY)
Admission: RE | Admit: 2022-04-18 | Discharge: 2022-04-18 | Disposition: A | Discharge status: Home / Self Care | Payer: Medicare Other | Source: Ambulatory Visit | Attending: Family Medicine | Admitting: Family Medicine

## 2022-04-18 DIAGNOSIS — Z1231 Encounter for screening mammogram for malignant neoplasm of breast: Secondary | ICD-10-CM | POA: Insufficient documentation

## 2022-04-24 ENCOUNTER — Other Ambulatory Visit: Payer: Self-pay | Admitting: *Deleted

## 2022-04-24 NOTE — Patient Outreach (Signed)
[facility]) Care Management  04/24/2022  Kaitlyn Sanchez August 15, 1935 [id number]  Multiple attempts to establish contact with patient without success. Nurse will close case at this time. RN Health Coach left HIPAA compliant message along with her contact information.  Plan: RN Health Coach will close case.    [name] RN, BSN [place] [phone] [email or web address]

## 2022-04-25 ENCOUNTER — Ambulatory Visit (INDEPENDENT_AMBULATORY_CARE_PROVIDER_SITE_OTHER): Payer: Medicare Other

## 2022-04-25 ENCOUNTER — Encounter: Payer: Self-pay | Admitting: Orthopedic Surgery

## 2022-04-25 ENCOUNTER — Ambulatory Visit: Payer: Medicare Other | Admitting: Orthopedic Surgery

## 2022-04-25 VITALS — Ht 62.0 in | Wt 125.0 lb

## 2022-04-25 DIAGNOSIS — M79641 Pain in right hand: Secondary | ICD-10-CM

## 2022-04-25 DIAGNOSIS — S66811A Strain of other specified muscles, fascia and tendons at wrist and hand level, right hand, initial encounter: Secondary | ICD-10-CM

## 2022-04-25 NOTE — Progress Notes (Signed)
New Patient Visit  Assessment: Kaitlyn Sanchez is a 87 y.o. female with the following: 1. Strain of metacarpophalangeal joint of right hand, initial encounter  Plan: Adele Dan slammed her long finger MCP 2+ months ago.  Radiographs negative.  Anticipate continued improvement as these can take a few months to improve.  Offered hand therapy and she declined.  No restrictions.  Follow up as needed.   Follow-up: Return if symptoms worsen or fail to improve.  Subjective:  Chief Complaint  Patient presents with   Hand Pain    Right for a long time has had injury to knuckle     History of Present Illness: Kaitlyn Sanchez is a 87 y.o. female who presents for evaluation of right hand pain.  While shopping, she was getting a cart.  Another cart was slammed on the dorsal aspect of the right hand.  She had immediate pain which slowly improved.  However, she still has pain.  She expected this to improve by now as the injury happened 2+ months ago.  She does not take medicines, other than tylenol.  No therapy.    Review of Systems: No fevers or chills No numbness or tingling No chest pain No shortness of breath No bowel or bladder dysfunction No GI distress No headaches   Medical History:  Past Medical History:  Diagnosis Date   Essential hypertension    GERD (gastroesophageal reflux disease)    History of cardiac catheterization 2013   Mild coronary atherosclerosis - [facility]   Hyperlipidemia    Hypertensive retinopathy    Hypothyroidism     Past Surgical History:  Procedure Laterality Date   APPENDECTOMY     CARPAL TUNNEL RELEASE     CATARACT EXTRACTION W/PHACO Left 12/28/2013   Procedure: CATARACT EXTRACTION PHACO AND INTRAOCULAR LENS PLACEMENT ([place]);  Surgeon: Tonny Branch, MD;  Location: [facility];  Service: Ophthalmology;  Laterality: Left;  CDE 18.64   CATARACT EXTRACTION W/PHACO Right 01/07/2014   Procedure: CATARACT EXTRACTION PHACO AND INTRAOCULAR LENS PLACEMENT (IOC);   Surgeon: Tonny Branch, MD;  Location: [facility];  Service: Ophthalmology;  Laterality: Right;  CDE:11.01   COLONOSCOPY     ESOPHAGOGASTRODUODENOSCOPY N/A 10/11/2014   RMR:non critical schatzkis ring/HH   EYE SURGERY     LUMBAR LAMINECTOMY     X 2   TONSILLECTOMY      Family History  Problem Relation Age of Onset   Colon cancer Other        no first degree relatives   Renal cancer Father    Heart attack Father    Glaucoma Mother    Heart failure Mother    Social History   Tobacco Use   Smoking status: Never   Smokeless tobacco: Never  Vaping Use   Vaping Use: Never used  Substance Use Topics   Alcohol use: No   Drug use: No    Allergies  Allergen Reactions   Levaquin [Levofloxacin] Other (See Comments)    Seizures    Current Meds  Medication Sig   amLODipine (NORVASC) 10 MG tablet Take 10 mg by mouth every evening.    aspirin EC 81 MG tablet Take 243 mg by mouth at bedtime.   Calcium Carbonate-Vit D-Min (CALTRATE 600+D PLUS PO) Take 1 tablet by mouth daily.   Chelated Potassium 99 MG TABS Take 1 tablet by mouth daily.   Cholecalciferol (VITAMIN D3) 125 MCG (5000 UT) CAPS Take 1 capsule by mouth daily.   docusate sodium (COLACE)  100 MG capsule Take 300 mg by mouth at bedtime.    Flaxseed, Linseed, (FLAXSEED OIL) 1000 MG CAPS Take 1,000 mg by mouth daily.    glucosamine-chondroitin 500-400 MG tablet Take 1 tablet by mouth daily.   labetalol (NORMODYNE) 100 MG tablet Take 1 tablet by mouth 3 (three) times daily.   levothyroxine (SYNTHROID) 100 MCG tablet Take 1 tablet (100 mcg total) by mouth daily before breakfast.   LORazepam (ATIVAN) 1 MG tablet Take 0.5 mg by mouth 2 (two) times daily.   multivitamin-iron-minerals-folic acid (CENTRUM) chewable tablet Chew 1 tablet by mouth daily.   olmesartan (BENICAR) 40 MG tablet Take 40 mg by mouth daily.   Omega 3 1200 MG CAPS Take 1,200 mg by mouth daily.    vitamin B-12 (CYANOCOBALAMIN) 1000 MCG tablet Take 1,000 mcg by mouth  daily.   vitamin C (ASCORBIC ACID) 500 MG tablet Take 500 mg by mouth daily.   zinc gluconate 50 MG tablet Take 50 mg by mouth daily.    Objective: Ht '5\' 2"'$  (1.575 m)   Wt 125 lb (56.7 kg)   BMI 22.86 kg/m   Physical Exam:  General: Elderly female., Alert and oriented., and No acute distress. Gait: Normal gait.  Right hand with some swelling to the ling finger MCP.  It is tender to palpation. Positive Tinel's over the MCP.  Full range of motion.  Good grip strength.  No laxity to varus or valgus stress.  IMAGING: I personally ordered and reviewed the following images  XR of the right hand were obtained in clinic today.  No acute injuries.  No fractures at the long finger MCP joint.  Mild diffuse degenerative changes in the fingers.  Loss of joint space at the 1st [facility] joint.  No dislocation.   Impression: Right hand with diffuse degenerative changes. No acute injuries.   New Medications:  No orders of the defined types were placed in this encounter.     Mordecai Rasmussen, MD  04/25/2022 10:33 PM

## 2022-05-28 ENCOUNTER — Encounter: Payer: Medicare Other | Admitting: *Deleted

## 2022-06-13 DIAGNOSIS — E890 Postprocedural hypothyroidism: Secondary | ICD-10-CM | POA: Diagnosis not present

## 2022-06-14 LAB — T4, FREE: Free T4: 1.59 ng/dL (ref 0.82–1.77)

## 2022-06-14 LAB — TSH: TSH: 7.07 u[IU]/mL — ABNORMAL HIGH (ref 0.450–4.500)

## 2022-06-18 ENCOUNTER — Other Ambulatory Visit: Payer: Self-pay | Admitting: "Endocrinology

## 2022-06-18 DIAGNOSIS — E890 Postprocedural hypothyroidism: Secondary | ICD-10-CM

## 2022-06-21 ENCOUNTER — Ambulatory Visit: Payer: Medicare Other | Admitting: "Endocrinology

## 2022-06-21 ENCOUNTER — Encounter: Payer: Self-pay | Admitting: "Endocrinology

## 2022-06-21 VITALS — BP 124/64 | HR 64 | Ht 62.0 in | Wt 129.4 lb

## 2022-06-21 DIAGNOSIS — E871 Hypo-osmolality and hyponatremia: Secondary | ICD-10-CM

## 2022-06-21 DIAGNOSIS — E890 Postprocedural hypothyroidism: Secondary | ICD-10-CM | POA: Diagnosis not present

## 2022-06-21 NOTE — Progress Notes (Signed)
06/21/2022, 8:04 PM  Endocrinology follow-up note   Subjective:    Patient ID: Kaitlyn Sanchez, female    DOB: [birth year], PCP [name], MD   Past Medical History:  Diagnosis Date   Essential hypertension    GERD (gastroesophageal reflux disease)    History of cardiac catheterization 2013   Mild coronary atherosclerosis - [place]   Hyperlipidemia    Hypertensive retinopathy    Hypothyroidism    Past Surgical History:  Procedure Laterality Date   APPENDECTOMY     CARPAL TUNNEL RELEASE     CATARACT EXTRACTION W/PHACO Left 12/28/2013   Procedure: CATARACT EXTRACTION PHACO AND INTRAOCULAR LENS PLACEMENT ([place]);  Surgeon: Tonny Branch, MD;  Location: [facility];  Service: Ophthalmology;  Laterality: Left;  CDE 18.64   CATARACT EXTRACTION W/PHACO Right 01/07/2014   Procedure: CATARACT EXTRACTION PHACO AND INTRAOCULAR LENS PLACEMENT (IOC);  Surgeon: Tonny Branch, MD;  Location: [facility];  Service: Ophthalmology;  Laterality: Right;  CDE:11.01   COLONOSCOPY     ESOPHAGOGASTRODUODENOSCOPY N/A 10/11/2014   RMR:non critical schatzkis ring/HH   EYE SURGERY     LUMBAR LAMINECTOMY     X 2   TONSILLECTOMY     Social History   Socioeconomic History   Marital status: Widowed    Spouse name: Not on file   Number of children: 2   Years of education: Not on file   Highest education level: Not on file  Occupational History   Occupation: retired    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 [place]  Tobacco Use   Smoking status: Never   Smokeless tobacco: Never  Vaping Use   Vaping Use: Never used  Substance and Sexual Activity   Alcohol use: No   Drug use: No   Sexual activity: Not Currently  Other Topics Concern   Not on file  Social History Narrative   Not on file   Social Determinants of Health   Financial Resource Strain: Not on file  Food Insecurity: No Food Insecurity (01/16/2022)   Hunger Vital Sign    Worried About Running Out  of Food in the Last Year: Never true    Ran Out of Food in the Last Year: Never true  Transportation Needs: No Transportation Needs (01/16/2022)   PRAPARE - Hydrologist (Medical): No    Lack of Transportation (Non-Medical): No  Physical Activity: Not on file  Stress: Not on file  Social Connections: Not on file   Family History  Problem Relation Age of Onset   Colon cancer Other        no first degree relatives   Renal cancer Father    Heart attack Father    Glaucoma Mother    Heart failure Mother    Outpatient Encounter Medications as of 06/21/2022  Medication Sig   amLODipine (NORVASC) 10 MG tablet Take 10 mg by mouth every evening.    aspirin EC 81 MG tablet Take 243 mg by mouth at bedtime.   Calcium Carbonate-Vit D-Min (CALTRATE 600+D PLUS PO) Take 1 tablet by mouth daily.   Chelated Potassium 99 MG TABS Take 1 tablet by mouth daily.   Cholecalciferol (VITAMIN D3) 125 MCG (5000 UT) CAPS Take  1 capsule by mouth daily.   docusate sodium (COLACE) 100 MG capsule Take 300 mg by mouth at bedtime.    Flaxseed, Linseed, (FLAXSEED OIL) 1000 MG CAPS Take 1,000 mg by mouth daily.    glucosamine-chondroitin 500-400 MG tablet Take 1 tablet by mouth daily.   labetalol (NORMODYNE) 100 MG tablet Take 1 tablet by mouth 3 (three) times daily.   levothyroxine (SYNTHROID) 100 MCG tablet TAKE 1 TABLET BY MOUTH ONCE A DAY.   LORazepam (ATIVAN) 1 MG tablet Take 0.5 mg by mouth 2 (two) times daily.   multivitamin-iron-minerals-folic acid (CENTRUM) chewable tablet Chew 1 tablet by mouth daily.   olmesartan (BENICAR) 40 MG tablet Take 40 mg by mouth daily.   Omega 3 1200 MG CAPS Take 1,200 mg by mouth daily.    vitamin B-12 (CYANOCOBALAMIN) 1000 MCG tablet Take 1,000 mcg by mouth daily.   vitamin C (ASCORBIC ACID) 500 MG tablet Take 500 mg by mouth daily.   zinc gluconate 50 MG tablet Take 50 mg by mouth daily.   No facility-administered encounter medications on file as of  06/21/2022.   ALLERGIES: Allergies  Allergen Reactions   Levaquin [Levofloxacin] Other (See Comments)    Seizures    VACCINATION STATUS:  There is no immunization history on file for this patient.  HPI JAIMA Sanchez is 87 y.o. female who presents today for a follow-up in the management of hypothyroidism and hyponatremia.  She is known to have longstanding hypothyroidism and hyponatremia associated with SIADH.    She continues to do well on fluid restrictions, currently drinking approximately 800 cc daily.  Her previsit labs show sodium stable at 136. She has no new complaints today.    -She denies craving for salt.  She denies any prior adrenal, pituitary, or pancreatic problems.   She denies any history of head injury, nor any history of malignancy.   -She reports reports compliance with her levothyroxine, currently 100 mcg p.o. daily. -Her hypothyroidism is related to her remote past  radioactive iodine ablation approximately at age 41.   Her previsit thyroid function tests are consistent with appropriate replacement.    She has a steady weight.  She has hypertension better controlled than last visit.  She is on 3 medications including amlodipine, Benicar and labetalol.    Review of Systems Limited as above.  Objective:       06/21/2022    9:22 AM 04/25/2022    3:05 PM 01/24/2022    1:57 PM  Vitals with BMI  Height '5\' 2"'$  '5\' 2"'$    Weight 129 lbs 6 oz 125 lbs   BMI 44.01 02.72   Systolic 536  644  Diastolic 64  71  Pulse 64  63    BP 124/64   Pulse 64   Ht '5\' 2"'$  (1.575 m)   Wt 129 lb 6.4 oz (58.7 kg)   BMI 23.67 kg/m   Wt Readings from Last 3 Encounters:  06/21/22 129 lb 6.4 oz (58.7 kg)  04/25/22 125 lb (56.7 kg)  12/18/21 130 lb 9.6 oz (59.2 kg)    Physical Exam   CMP ( most recent) CMP     Component Value Date/Time   NA 136 03/21/2022 1137   K 4.6 03/21/2022 1137   CL 96 03/21/2022 1137   CO2 26 03/21/2022 1137   GLUCOSE 92 03/21/2022 1137   GLUCOSE 93  03/19/2020 0727   BUN 24 03/21/2022 1137   CREATININE 0.76 03/21/2022 1137   CREATININE 0.62  08/19/2015 1237   CALCIUM 9.1 03/21/2022 1137   PROT 5.9 (L) 03/21/2022 1137   ALBUMIN 4.2 03/21/2022 1137   AST 20 03/21/2022 1137   ALT 20 03/21/2022 1137   ALKPHOS 93 03/21/2022 1137   BILITOT <0.2 03/21/2022 1137   GFRNONAA 72 06/07/2020 0833   GFRAA 83 06/07/2020 0833     Lab Results  Component Value Date   TSH 7.070 (H) 06/13/2022   TSH 3.120 03/21/2022   TSH 1.680 12/11/2021   TSH 1.840 06/09/2021   TSH 1.530 12/06/2020   TSH 1.370 06/07/2020   TSH 0.764 03/18/2020   TSH 12.285 (H) 02/24/2019   FREET4 1.59 06/13/2022   FREET4 1.48 03/21/2022   FREET4 1.88 (H) 12/11/2021   FREET4 1.75 06/09/2021   FREET4 1.71 12/06/2020   FREET4 1.74 06/07/2020      Assessment & Plan:   1. Hyponatremia/SIADH 2. Hypothyroidism following radioiodine therapy  -Her previsit CMP showed sodium stable at between 136 and 145.    Serum and urine osmolality is appropriate at this time. -Review of her current and prior hospital records indicate high likelihood of SIADH as a cause of her hyponatremia.  -Treatment approach was discussed again in detail with her and her daughter.    -I have reemphasized that mainstay of therapy is being water striction.   -  I advised her to continue on water restrictions, total daily fluid intake of 800 cc.      She is allowed to consume half of her fluid in the form of free water, and  other half in various liquid forms including electrolytes, juice, milk, coffee, etc. she may add another 100 cc of water for the warmer season.  -Patients with subacute hyponatremia from SIADH tend  to have a lower hypothalamic setpoint for osmostat, hence,  correction of sodium into the 140s is unnecessary.  - They generally tend to do well with sodium in the low normal range between 130-135 mmol/L.  She is allowed to consume ten more  ounce of fluid daily during warm  seasons.  -She is advised to call clinic if she started to have symptoms including lightheadedness, seizures.  -Regarding her hypothyroidism: Her previsit thyroid function tests are consistent with appropriate replacement from the point of view of free T4.  Her current dose of levothyroxine at 100 mcg is the safest option compared to increasing it as a result of her elevated TSH of 7.07.  Given her age, it is necessary to avoid over replacement.  She is hesitantly accepting this decision.  I advised her to continue levothyroxine 100 mcg p.o. daily before breakfast.     - We discussed about the correct intake of her thyroid hormone, on empty stomach at fasting, with water, separated by at least 30 minutes from breakfast and other medications,  and separated by more than 4 hours from calcium, iron, multivitamins, acid reflux medications (PPIs). -Patient is made aware of the fact that thyroid hormone replacement is needed for life, dose to be adjusted by periodic monitoring of thyroid function tests.   - I did not initiate any new prescriptions today. - she is advised to maintain close follow up with [name], MD for primary care needs.   I spent 23 minutes in the care of the patient today including review of labs from Thyroid Function, CMP, and other relevant labs ; imaging/biopsy records (current and previous including abstractions from other facilities); face-to-face time discussing  her lab results and symptoms, medications doses, her  options of short and long term treatment based on the latest standards of care / guidelines;   and documenting the encounter.  Kaitlyn Sanchez  participated in the discussions, expressed understanding, and voiced agreement with the above plans.  All questions were answered to her satisfaction. she is encouraged to contact clinic should she have any questions or concerns prior to her return visit.   Follow up plan: Return in about 6 months (around 12/20/2022) for  F/U with Pre-visit Labs.   Glade Lloyd, MD [facility] [address] [place], [place] [address] Phone: [phone]  Fax: [phone]     06/21/2022, 8:04 PM  This note was partially dictated with voice recognition software. Similar sounding words can be transcribed inadequately or may not  be corrected upon review.

## 2022-07-25 ENCOUNTER — Ambulatory Visit (INDEPENDENT_AMBULATORY_CARE_PROVIDER_SITE_OTHER): Payer: Medicare Other | Admitting: Ophthalmology

## 2022-07-25 ENCOUNTER — Encounter (INDEPENDENT_AMBULATORY_CARE_PROVIDER_SITE_OTHER): Payer: Self-pay | Admitting: Ophthalmology

## 2022-07-25 DIAGNOSIS — I10 Essential (primary) hypertension: Secondary | ICD-10-CM

## 2022-07-25 DIAGNOSIS — H35033 Hypertensive retinopathy, bilateral: Secondary | ICD-10-CM | POA: Diagnosis not present

## 2022-07-25 DIAGNOSIS — H3561 Retinal hemorrhage, right eye: Secondary | ICD-10-CM

## 2022-07-25 DIAGNOSIS — Z961 Presence of intraocular lens: Secondary | ICD-10-CM | POA: Diagnosis not present

## 2022-07-25 NOTE — Progress Notes (Signed)
[facility] Note  07/25/2022     CHIEF COMPLAINT Patient presents for Retina Follow Up    HISTORY OF PRESENT ILLNESS: Kaitlyn Sanchez is a 87 y.o. female who presents to the clinic today for:   HPI     Retina Follow Up   Patient presents with  Other.  In both eyes.  This started 6 months ago.  I, the attending physician,  performed the HPI with the patient and updated documentation appropriately.        Comments   Patient here for 6 months retina follow up for HTN OU. Patient states vision doing ok. No eye pain.       Last edited by Bernarda Caffey, MD on 07/25/2022 12:36 PM.    Pt states vision is stable. Pt reports she stopped checking her BP at home. Reports compliance with her 5 BP meds  Referring physician: [name], MD [address] [place],  [place] [address]  HISTORICAL INFORMATION:   Selected notes from the MEDICAL RECORD NUMBER Referred by Dr. Madelin Headings for concern of diabetic retinopathy LEE: 03.11.20 (M. 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 (Ophthalmic Drugs)   No current facility-administered medications for this visit. (Ophthalmic Drugs)   Current Outpatient Medications (Other)  Medication Sig   amLODipine (NORVASC) 10 MG tablet Take 10 mg by mouth every evening.    aspirin EC 81 MG tablet Take 243 mg by mouth at bedtime.   Calcium Carbonate-Vit D-Min (CALTRATE 600+D PLUS PO) Take 1 tablet by mouth daily.   Chelated Potassium 99 MG TABS Take 1 tablet by mouth daily.   Cholecalciferol (VITAMIN D3) 125 MCG (5000 UT) CAPS Take 1 capsule by mouth daily.   docusate sodium (COLACE) 100 MG capsule Take 300 mg by mouth at bedtime.    Flaxseed, Linseed, (FLAXSEED OIL) 1000 MG CAPS Take 1,000 mg by mouth daily.    glucosamine-chondroitin 500-400 MG  tablet Take 1 tablet by mouth daily.   labetalol (NORMODYNE) 100 MG tablet Take 1 tablet by mouth 3 (three) times daily.   levothyroxine (SYNTHROID) 100 MCG tablet TAKE 1 TABLET BY MOUTH ONCE A DAY.   LORazepam (ATIVAN) 1 MG tablet Take 0.5 mg by mouth 2 (two) times daily.   multivitamin-iron-minerals-folic acid (CENTRUM) chewable tablet Chew 1 tablet by mouth daily.   olmesartan (BENICAR) 40 MG tablet Take 40 mg by mouth daily.   Omega 3 1200 MG CAPS Take 1,200 mg by mouth daily.    vitamin B-12 (CYANOCOBALAMIN) 1000 MCG tablet Take 1,000 mcg by mouth daily.   vitamin C (ASCORBIC ACID) 500 MG tablet Take 500 mg by mouth daily.   zinc gluconate 50 MG tablet Take 50 mg by mouth daily.   No current facility-administered medications for this visit. (Other)   REVIEW OF SYSTEMS: ROS   Positive for: Gastrointestinal, Eyes, Respiratory Negative for: Constitutional, Neurological, Skin, Genitourinary, Musculoskeletal, HENT, Endocrine, Cardiovascular, Psychiatric, Allergic/Imm, Heme/Lymph Last edited by Theodore Demark, COA on 07/25/2022 10:15 AM.     ALLERGIES Allergies  Allergen Reactions   Levaquin [Levofloxacin] Other (See Comments)    Seizures   PAST MEDICAL HISTORY Past Medical History:  Diagnosis Date   Essential hypertension    GERD (gastroesophageal reflux disease)    History of cardiac catheterization 2013   Mild coronary atherosclerosis - [facility]  Hyperlipidemia    Hypertensive retinopathy    Hypothyroidism    Past Surgical History:  Procedure Laterality Date   APPENDECTOMY     CARPAL TUNNEL RELEASE     CATARACT EXTRACTION W/PHACO Left 12/28/2013   Procedure: CATARACT EXTRACTION PHACO AND INTRAOCULAR LENS PLACEMENT ([place]);  Surgeon: Tonny Branch, MD;  Location: [facility];  Service: Ophthalmology;  Laterality: Left;  CDE 18.64   CATARACT EXTRACTION W/PHACO Right 01/07/2014   Procedure: CATARACT EXTRACTION PHACO AND INTRAOCULAR LENS PLACEMENT (IOC);  Surgeon: Tonny Branch, MD;   Location: [facility];  Service: Ophthalmology;  Laterality: Right;  CDE:11.01   COLONOSCOPY     ESOPHAGOGASTRODUODENOSCOPY N/A 10/11/2014   RMR:non critical schatzkis ring/HH   EYE SURGERY     LUMBAR LAMINECTOMY     X 2   TONSILLECTOMY     FAMILY HISTORY Family History  Problem Relation Age of Onset   Colon cancer Other        no first degree relatives   Renal cancer Father    Heart attack Father    Glaucoma Mother    Heart failure Mother    SOCIAL HISTORY Social History   Tobacco Use   Smoking status: Never   Smokeless tobacco: Never  Vaping Use   Vaping Use: Never used  Substance Use Topics   Alcohol use: No   Drug use: No       OPHTHALMIC EXAM: Base Eye Exam     Visual Acuity (Snellen - Linear)       Right Left   Dist cc 20/20 20/20    Correction: Glasses         Tonometry (Tonopen, 10:12 AM)       Right Left   Pressure 13 13         Pupils       Dark Light Shape React APD   Right 3 2 Round Brisk None   Left 3 2 Round Brisk None         Visual Fields (Counting fingers)       Left Right    Full Full         Extraocular Movement       Right Left    Full, Ortho Full, Ortho         Neuro/Psych     Oriented x3: Yes   Mood/Affect: Normal         Dilation     Both eyes: 1.0% Mydriacyl, 2.5% Phenylephrine @ 10:12 AM           Slit Lamp and Fundus Exam     Slit Lamp Exam       Right Left   Lids/Lashes Dermatochalasis - upper lid, Ptosis Dermatochalasis - upper lid, Ptosis   Conjunctiva/Sclera mild temporal Pinguecula mild nasal Temporal Pinguecula   Cornea Arcus, 1+ finePunctate epithelial erosions, Well healed cataract wounds, fine endo pigment Arcus, trace inferior Punctate epithelial erosions, Well healed cataract wounds, mild endo pigment   Anterior Chamber Deep and quiet Deep and quiet   Iris Round and dilated Round and moderately dilated to 4.35m   Lens PC IOL in good position with open PC PC IOL in good position  with open PC   Anterior Vitreous Vitreous syneresis, Posterior vitreous detachment mild Vitreous syneresis, Posterior vitreous detachment, vitreous condensations inferiorly         Fundus Exam       Right Left   Disc Pink and Sharp Pink and Sharp   C/D Ratio 0.3  0.3   Macula Blunted foveal reflex, trace cystic changes/edema - slightly increased; focal cluster of MA/IRH temporal fovea--increased, no exudates Flat, good foveal reflex, mild RPE mottling, trace Epiretinal membrane, rare MA, No edema   Vessels Vascular attenuation, mild tortuosity Vascular attenuation, mild tortuosity   Periphery Attached, no heme Attached, no heme           Refraction     Wearing Rx       Sphere Cylinder Add   Right -0.25 Sphere +2.25   Left -0.25 Sphere +2.25    Type: PAL           IMAGING AND PROCEDURES  Imaging and Procedures for _0 @  OCT, Retina - OU - Both Eyes       Right Eye Quality was good. Central Foveal Thickness: 286. Progression has worsened. Findings include no SRF, abnormal foveal contour, intraretinal hyper-reflective material, intraretinal fluid (Mild interval increase in IRF / cystic changes temporal fovea, persistent focal IRHM ).   Left Eye Quality was good. Central Foveal Thickness: 270. Progression has been stable. Findings include normal foveal contour, no IRF, no SRF.   Notes *Images captured and stored on drive  Diagnosis / Impression:  OD: Mild interval increase in IRF / cystic changes temporal fovea, persistent focal IRHM  OS: NFP, no IRF/SRF   Clinical management:  See below  Abbreviations: NFP - Normal foveal profile. CME - cystoid macular edema. PED - pigment epithelial detachment. IRF - intraretinal fluid. SRF - subretinal fluid. EZ - ellipsoid zone. ERM - epiretinal membrane. ORA - outer retinal atrophy. ORT - outer retinal tubulation. SRHM - subretinal hyper-reflective material            ASSESSMENT/PLAN:    ICD-10-CM   1.  Hypertensive retinopathy of both eyes  H35.033 OCT, Retina - OU - Both Eyes    2. Essential hypertension  I10     3. Retinal hemorrhage of right eye  H35.61     4. Pseudophakia of both eyes  Z96.1       1-3. Hypertensive retinopathy OU  - focal intraretinal hemes in macula OD -- slightly worse today  - OCT shows slight increase in cystic changes OD, temporal fovea -- ?mild old BRVO  - BCVA remains 20/20 OU  - FA (05.05.21) shows focal MA in macula with late leakage OU    - BP in office today  162/74 04.19.23 185/71  03.05.2021  167/72 R arm and 147/67 L arm  11.06.2020  147/67  07.06.2020 169/73  - meds adjusted by PCP and cardiology -- BP not checked by patient   - discussed importance of tight BP control  - monitor for now  - discussed potential for focal laser OD if vision decreases  - f/u 6-8 weeks, sooner prn, DFE/OCT, possible injection  4. Pseudophakia OU  - s/p CE/IOL OU - Dr. Tonny Branch (2015)  - beautiful surgeries, doing well  - continue to monitor  Ophthalmic Meds Ordered this visit:  No orders of the defined types were placed in this encounter.    Return in about 7 weeks (around 09/12/2022) for f/u HTN Ret OU, DFE, OCT, Possible, IVA.  There are no Patient Instructions on file for this visit.    This document serves as a record of services personally performed by Gardiner Sleeper, MD, PhD. It was created on their behalf by Renaldo Reel, [place] an ophthalmic technician. The creation of this record is the provider's dictation and/or activities during the visit.  Electronically signed by:  Renaldo Reel, COT  10.18.23 12:42 PM   Gardiner Sleeper, M.D., Ph.D. Diseases & Surgery of the Retina and Vitreous [facility]  I have reviewed the above documentation for accuracy and completeness, and I agree with the above. Gardiner Sleeper, M.D., Ph.D. 07/25/22 12:42 PM   Abbreviations: M myopia (nearsighted); A astigmatism; H  hyperopia (farsighted); P presbyopia; Mrx spectacle prescription;  CTL contact lenses; OD right eye; OS left eye; OU both eyes  XT exotropia; ET esotropia; PEK punctate epithelial keratitis; PEE punctate epithelial erosions; DES dry eye syndrome; MGD meibomian gland dysfunction; ATs artificial tears; PFAT's preservative free artificial tears; [place] nuclear sclerotic cataract; PSC posterior subcapsular cataract; ERM epi-retinal membrane; PVD posterior vitreous detachment; RD retinal detachment; DM diabetes mellitus; DR diabetic retinopathy; NPDR non-proliferative diabetic retinopathy; PDR proliferative diabetic retinopathy; CSME clinically significant macular edema; DME diabetic macular edema; dbh dot blot hemorrhages; CWS cotton wool spot; POAG primary open angle glaucoma; C/D cup-to-disc ratio; HVF humphrey visual field; GVF goldmann visual field; OCT optical coherence tomography; IOP intraocular pressure; BRVO Branch retinal vein occlusion; CRVO central retinal vein occlusion; CRAO central retinal artery occlusion; BRAO branch retinal artery occlusion; RT retinal tear; SB scleral buckle; PPV pars plana vitrectomy; VH Vitreous hemorrhage; PRP panretinal laser photocoagulation; IVK intravitreal kenalog; VMT vitreomacular traction; MH Macular hole;  NVD neovascularization of the disc; NVE neovascularization elsewhere; AREDS age related eye disease study; ARMD age related macular degeneration; POAG primary open angle glaucoma; EBMD epithelial/anterior basement membrane dystrophy; ACIOL anterior chamber intraocular lens; IOL intraocular lens; PCIOL posterior chamber intraocular lens; Phaco/IOL phacoemulsification with intraocular lens placement; [place] photorefractive keratectomy; LASIK laser assisted in situ keratomileusis; HTN hypertension; DM diabetes mellitus; COPD chronic obstructive pulmonary disease

## 2022-08-15 ENCOUNTER — Telehealth: Payer: Self-pay | Admitting: "Endocrinology

## 2022-08-15 NOTE — Telephone Encounter (Signed)
Patient is calling saying she is having of hair loss, dry, skin is dry, constipated, fatigue, can she go ahead and do labs and see you sooner than feb?

## 2022-08-16 DIAGNOSIS — E890 Postprocedural hypothyroidism: Secondary | ICD-10-CM | POA: Diagnosis not present

## 2022-08-16 DIAGNOSIS — E871 Hypo-osmolality and hyponatremia: Secondary | ICD-10-CM | POA: Diagnosis not present

## 2022-08-17 LAB — COMPREHENSIVE METABOLIC PANEL
ALT: 17 IU/L (ref 0–32)
AST: 19 IU/L (ref 0–40)
Albumin/Globulin Ratio: 2.3 — ABNORMAL HIGH (ref 1.2–2.2)
Albumin: 4.1 g/dL (ref 3.7–4.7)
Alkaline Phosphatase: 94 IU/L (ref 44–121)
BUN/Creatinine Ratio: 25 (ref 12–28)
BUN: 21 mg/dL (ref 8–27)
Bilirubin Total: <0.2 mg/dL (ref 0.0–1.2)
CO2: 26 mmol/L (ref 20–29)
Calcium: 9.3 mg/dL (ref 8.7–10.3)
Chloride: 102 mmol/L (ref 96–106)
Creatinine, Ser: 0.84 mg/dL (ref 0.57–1.00)
Globulin, Total: 1.8 g/dL (ref 1.5–4.5)
Glucose: 98 mg/dL (ref 70–99)
Potassium: 4.4 mmol/L (ref 3.5–5.2)
Sodium: 140 mmol/L (ref 134–144)
Total Protein: 5.9 g/dL — ABNORMAL LOW (ref 6.0–8.5)
eGFR: 68 mL/min/{1.73_m2} (ref >59)

## 2022-08-17 LAB — T4, FREE: Free T4: 1.45 ng/dL (ref 0.82–1.77)

## 2022-08-17 LAB — TSH: TSH: 5.49 u[IU]/mL — ABNORMAL HIGH (ref 0.450–4.500)

## 2022-08-20 ENCOUNTER — Ambulatory Visit: Payer: Medicare Other | Admitting: "Endocrinology

## 2022-08-20 ENCOUNTER — Encounter: Payer: Self-pay | Admitting: "Endocrinology

## 2022-08-20 VITALS — BP 140/66 | HR 64 | Ht 62.0 in | Wt 133.2 lb

## 2022-08-20 DIAGNOSIS — E871 Hypo-osmolality and hyponatremia: Secondary | ICD-10-CM | POA: Diagnosis not present

## 2022-08-20 DIAGNOSIS — E890 Postprocedural hypothyroidism: Secondary | ICD-10-CM

## 2022-08-20 NOTE — Progress Notes (Signed)
08/20/2022, 6:27 PM  Endocrinology follow-up note   Subjective:    Patient ID: Kaitlyn Sanchez, female    DOB: 01-08-1935, PCP [name], MD   Past Medical History:  Diagnosis Date   Essential hypertension    GERD (gastroesophageal reflux disease)    History of cardiac catheterization 2013   Mild coronary atherosclerosis - [place]   Hyperlipidemia    Hypertensive retinopathy    Hypothyroidism    Past Surgical History:  Procedure Laterality Date   APPENDECTOMY     CARPAL TUNNEL RELEASE     CATARACT EXTRACTION W/PHACO Left 12/28/2013   Procedure: CATARACT EXTRACTION PHACO AND INTRAOCULAR LENS PLACEMENT ([place]);  Surgeon: Tonny Branch, MD;  Location: [facility];  Service: Ophthalmology;  Laterality: Left;  CDE 18.64   CATARACT EXTRACTION W/PHACO Right 01/07/2014   Procedure: CATARACT EXTRACTION PHACO AND INTRAOCULAR LENS PLACEMENT ([facility]);  Surgeon: Tonny Branch, MD;  Location: [facility];  Service: Ophthalmology;  Laterality: Right;  CDE:11.01   COLONOSCOPY     ESOPHAGOGASTRODUODENOSCOPY N/A 10/11/2014   RMR:non critical schatzkis ring/HH   EYE SURGERY     LUMBAR LAMINECTOMY     X 2   TONSILLECTOMY     Social History   Socioeconomic History   Marital status: Widowed    Spouse name: Not on file   Number of children: 2   Years of education: Not on file   Highest education level: Not on file  Occupational History   Occupation: retired    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 [place]  Tobacco Use   Smoking status: Never   Smokeless tobacco: Never  Vaping Use   Vaping Use: Never used  Substance and Sexual Activity   Alcohol use: No   Drug use: No   Sexual activity: Not Currently  Other Topics Concern   Not on file  Social History Narrative   Not on file   Social Determinants of Health   Financial Resource Strain: Not on file  Food Insecurity: No Food Insecurity (01/16/2022)   Hunger Vital Sign    Worried About Running Out  of Food in the Last Year: Never true    Ran Out of Food in the Last Year: Never true  Transportation Needs: No Transportation Needs (01/16/2022)   PRAPARE - Hydrologist (Medical): No    Lack of Transportation (Non-Medical): No  Physical Activity: Not on file  Stress: Not on file  Social Connections: Not on file   Family History  Problem Relation Age of Onset   Colon cancer Other        no first degree relatives   Renal cancer Father    Heart attack Father    Glaucoma Mother    Heart failure Mother    Outpatient Encounter Medications as of 08/20/2022  Medication Sig   amLODipine (NORVASC) 10 MG tablet Take 10 mg by mouth every evening.    aspirin EC 81 MG tablet Take 243 mg by mouth at bedtime.   Calcium Carbonate-Vit D-Min (CALTRATE 600+D PLUS PO) Take 1 tablet by mouth daily.   Chelated Potassium 99 MG TABS Take 1 tablet by mouth daily.   Cholecalciferol (VITAMIN D3) 125 MCG (5000 UT) CAPS Take  1 capsule by mouth daily.   docusate sodium (COLACE) 100 MG capsule Take 300 mg by mouth at bedtime.    Flaxseed, Linseed, (FLAXSEED OIL) 1000 MG CAPS Take 1,000 mg by mouth daily.    glucosamine-chondroitin 500-400 MG tablet Take 1 tablet by mouth daily.   labetalol (NORMODYNE) 100 MG tablet Take 1 tablet by mouth 3 (three) times daily.   levothyroxine (SYNTHROID) 100 MCG tablet TAKE 1 TABLET BY MOUTH ONCE A DAY.   LORazepam (ATIVAN) 1 MG tablet Take 0.5 mg by mouth 2 (two) times daily.   multivitamin-iron-minerals-folic acid (CENTRUM) chewable tablet Chew 1 tablet by mouth daily.   olmesartan (BENICAR) 40 MG tablet Take 40 mg by mouth daily.   Omega 3 1200 MG CAPS Take 1,200 mg by mouth daily.    vitamin B-12 (CYANOCOBALAMIN) 1000 MCG tablet Take 1,000 mcg by mouth daily.   vitamin C (ASCORBIC ACID) 500 MG tablet Take 500 mg by mouth daily.   zinc gluconate 50 MG tablet Take 50 mg by mouth daily.   No facility-administered encounter medications on file as  of 08/20/2022.   ALLERGIES: Allergies  Allergen Reactions   Levaquin [Levofloxacin] Other (See Comments)    Seizures    VACCINATION STATUS:  There is no immunization history on file for this patient.  HPI Kaitlyn Sanchez is 87 y.o. female who presents today for a follow-up in the management of hypothyroidism and hyponatremia.  She is known to have longstanding hypothyroidism and hyponatremia associated with SIADH.    She continues to do well on fluid restrictions, currently drinking approximately 800 cc daily.  Her previsit labs showed stable sodium at 140.   She has no new complaints today.    -She denies craving for salt.  She denies any prior adrenal, pituitary, or pancreatic problems.   She denies any history of head injury, nor any history of malignancy.   -She reports reports compliance with her levothyroxine, currently on 100 mcg p.o. daily before breakfast.     -Her hypothyroidism is related to her remote past  radioactive iodine ablation approximately at age 89.   Her previsit thyroid function tests are consistent with appropriate replacement.    She has a steady weight.  She has hypertension better controlled than last visit.  She is on 3 medications including amlodipine, Benicar and labetalol.    Review of Systems Limited as above.  Objective:       08/20/2022    1:37 PM 06/21/2022    9:22 AM 04/25/2022    3:05 PM  Vitals with BMI  Height '5\' 2"'$  '5\' 2"'$  '5\' 2"'$   Weight 133 lbs 3 oz 129 lbs 6 oz 125 lbs  BMI 24.36 16.10 96.04  Systolic 540 981   Diastolic 66 64   Pulse 64 64     BP (!) 140/66   Pulse 64   Ht '5\' 2"'$  (1.575 m)   Wt 133 lb 3.2 oz (60.4 kg)   BMI 24.36 kg/m   Wt Readings from Last 3 Encounters:  08/20/22 133 lb 3.2 oz (60.4 kg)  06/21/22 129 lb 6.4 oz (58.7 kg)  04/25/22 125 lb (56.7 kg)    Physical Exam   CMP ( most recent) CMP     Component Value Date/Time   NA 140 08/16/2022 1030   K 4.4 08/16/2022 1030   CL 102 08/16/2022 1030   CO2  26 08/16/2022 1030   GLUCOSE 98 08/16/2022 1030   GLUCOSE 93 03/19/2020 0727  BUN 21 08/16/2022 1030   CREATININE 0.84 08/16/2022 1030   CREATININE 0.62 08/19/2015 1237   CALCIUM 9.3 08/16/2022 1030   PROT 5.9 (L) 08/16/2022 1030   ALBUMIN 4.1 08/16/2022 1030   AST 19 08/16/2022 1030   ALT 17 08/16/2022 1030   ALKPHOS 94 08/16/2022 1030   BILITOT <0.2 08/16/2022 1030   GFRNONAA 72 06/07/2020 0833   GFRAA 83 06/07/2020 0833     Lab Results  Component Value Date   TSH 5.490 (H) 08/16/2022   TSH 7.070 (H) 06/13/2022   TSH 3.120 03/21/2022   TSH 1.680 12/11/2021   TSH 1.840 06/09/2021   TSH 1.530 12/06/2020   TSH 1.370 06/07/2020   TSH 0.764 03/18/2020   TSH 12.285 (H) 02/24/2019   FREET4 1.45 08/16/2022   FREET4 1.59 06/13/2022   FREET4 1.48 03/21/2022   FREET4 1.88 (H) 12/11/2021   FREET4 1.75 06/09/2021   FREET4 1.71 12/06/2020   FREET4 1.74 06/07/2020      Assessment & Plan:   1. Hyponatremia/SIADH 2. Hypothyroidism following radioiodine therapy  -Her previsit CMP showed sodium stable at between 140 and 145.    Serum and urine osmolality is appropriate at this time. -Review of her current and prior hospital records indicate high likelihood of SIADH as a cause of her hyponatremia.  -Treatment approach was discussed again in detail with her and her daughter.    -I have reemphasized that mainstay of therapy is still free water restrictions.    -  I advised her to continue on water restrictions, total daily fluid intake of 800 cc.      She is allowed to consume half of her fluid in the form of free water, and  other half in various liquid forms including electrolytes, juice, milk, coffee, etc. she may add another 100 cc of water for the warmer season.  -Patients with subacute hyponatremia from SIADH tend  to have a lower hypothalamic setpoint for osmostat, hence,  correction of sodium into the 140s is unnecessary.  - They generally tend to do well with sodium in the  low normal range between 130-135 mmol/L.  She is allowed to consume ten more  ounce of fluid daily during warm seasons.  -She is advised to call clinic if she started to have symptoms including lightheadedness, seizures.  -Regarding her hypothyroidism: Her previsit thyroid function tests are consistent with appropriate replacement from the point of view of free T4.  Her current dose of levothyroxine at 100 mcg is safe and adequate.  Her TSH is improving to 5.4 while free T4 is stable at 1.45.   In this patient, priority will be to avoid overtreatment.  She is advised to continue levothyroxine 100 mcg p.o. daily before breakfast.  - We discussed about the correct intake of her thyroid hormone, on empty stomach at fasting, with water, separated by at least 30 minutes from breakfast and other medications,  and separated by more than 4 hours from calcium, iron, multivitamins, acid reflux medications (PPIs). -Patient is made aware of the fact that thyroid hormone replacement is needed for life, dose to be adjusted by periodic monitoring of thyroid function tests.   - I did not initiate any new prescriptions today. - she is advised to maintain close follow up with [name], MD for primary care needs.   I spent 21 minutes in the care of the patient today including review of labs from Thyroid Function, CMP, and other relevant labs ; imaging/biopsy records (current and  previous including abstractions from other facilities); face-to-face time discussing  her lab results and symptoms, medications doses, her options of short and long term treatment based on the latest standards of care / guidelines;   and documenting the encounter.  Kaitlyn Sanchez  participated in the discussions, expressed understanding, and voiced agreement with the above plans.  All questions were answered to her satisfaction. she is encouraged to contact clinic should she have any questions or concerns prior to her return  visit.   Follow up plan: Return for Keep Reg. Appt. with Pre-visit Labs.   Glade Lloyd, MD [facility] [address] [place], [place] [address] Phone: [phone]  Fax: [phone]     08/20/2022, 6:27 PM  This note was partially dictated with voice recognition software. Similar sounding words can be transcribed inadequately or may not  be corrected upon review.

## 2022-09-10 ENCOUNTER — Other Ambulatory Visit: Payer: Self-pay | Admitting: "Endocrinology

## 2022-09-10 DIAGNOSIS — B351 Tinea unguium: Secondary | ICD-10-CM | POA: Diagnosis not present

## 2022-09-10 DIAGNOSIS — Z1283 Encounter for screening for malignant neoplasm of skin: Secondary | ICD-10-CM | POA: Diagnosis not present

## 2022-09-10 DIAGNOSIS — Z85828 Personal history of other malignant neoplasm of skin: Secondary | ICD-10-CM | POA: Diagnosis not present

## 2022-09-10 DIAGNOSIS — Z08 Encounter for follow-up examination after completed treatment for malignant neoplasm: Secondary | ICD-10-CM | POA: Diagnosis not present

## 2022-09-10 DIAGNOSIS — D225 Melanocytic nevi of trunk: Secondary | ICD-10-CM | POA: Diagnosis not present

## 2022-09-10 DIAGNOSIS — E890 Postprocedural hypothyroidism: Secondary | ICD-10-CM

## 2022-09-11 NOTE — Progress Notes (Signed)
[facility] Note  09/17/2022     CHIEF COMPLAINT Patient presents for Retina Follow Up    HISTORY OF PRESENT ILLNESS: Kaitlyn Sanchez is a 87 y.o. female who presents to the clinic today for:   HPI     Retina Follow Up   Patient presents with  Other.  In both eyes.  This started 7 months ago.  Duration of 7.  I, the attending physician,  performed the HPI with the patient and updated documentation appropriately.        Comments   7 week retina follow up HTN ret and IVA Pt states no vision changes noticed       Last edited by Bernarda Caffey, MD on 09/17/2022  1:25 PM.      Referring physician: [name], MD [address] [place],  [place] [address]  HISTORICAL INFORMATION:   Selected notes from the MEDICAL RECORD NUMBER Referred by Dr. Madelin Headings for concern of diabetic retinopathy LEE: 03.11.20 (M. 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 (Ophthalmic Drugs)   No current facility-administered medications for this visit. (Ophthalmic Drugs)   Current Outpatient Medications (Other)  Medication Sig   amLODipine (NORVASC) 10 MG tablet Take 10 mg by mouth every evening.    aspirin EC 81 MG tablet Take 243 mg by mouth at bedtime.   Calcium Carbonate-Vit D-Min (CALTRATE 600+D PLUS PO) Take 1 tablet by mouth daily.   Chelated Potassium 99 MG TABS Take 1 tablet by mouth daily.   Cholecalciferol (VITAMIN D3) 125 MCG (5000 UT) CAPS Take 1 capsule by mouth daily.   docusate sodium (COLACE) 100 MG capsule Take 300 mg by mouth at bedtime.    Flaxseed, Linseed, (FLAXSEED OIL) 1000 MG CAPS Take 1,000 mg by mouth daily.    glucosamine-chondroitin 500-400 MG tablet Take 1 tablet by mouth daily.   labetalol (NORMODYNE) 100 MG tablet Take 1 tablet by mouth 3 (three) times daily.    levothyroxine (SYNTHROID) 100 MCG tablet TAKE 1 TABLET BY MOUTH ONCE A DAY.   LORazepam (ATIVAN) 1 MG tablet Take 0.5 mg by mouth 2 (two) times daily.   multivitamin-iron-minerals-folic acid (CENTRUM) chewable tablet Chew 1 tablet by mouth daily.   olmesartan (BENICAR) 40 MG tablet Take 40 mg by mouth daily.   Omega 3 1200 MG CAPS Take 1,200 mg by mouth daily.    vitamin B-12 (CYANOCOBALAMIN) 1000 MCG tablet Take 1,000 mcg by mouth daily.   vitamin C (ASCORBIC ACID) 500 MG tablet Take 500 mg by mouth daily.   zinc gluconate 50 MG tablet Take 50 mg by mouth daily.   No current facility-administered medications for this visit. (Other)   REVIEW OF SYSTEMS:   ALLERGIES Allergies  Allergen Reactions   Levaquin [Levofloxacin] Other (See Comments)    Seizures   PAST MEDICAL HISTORY Past Medical History:  Diagnosis Date   Essential hypertension    GERD (gastroesophageal reflux disease)    History of cardiac catheterization 2013   Mild coronary atherosclerosis - [place]   Hyperlipidemia    Hypertensive retinopathy    Hypothyroidism    Past Surgical History:  Procedure Laterality Date   APPENDECTOMY     CARPAL TUNNEL RELEASE     CATARACT EXTRACTION W/PHACO Left 12/28/2013   Procedure: CATARACT EXTRACTION PHACO AND INTRAOCULAR LENS PLACEMENT ([place]);  Surgeon:  Tonny Branch, MD;  Location: [facility];  Service: Ophthalmology;  Laterality: Left;  CDE 18.64   CATARACT EXTRACTION W/PHACO Right 01/07/2014   Procedure: CATARACT EXTRACTION PHACO AND INTRAOCULAR LENS PLACEMENT ([facility]);  Surgeon: Tonny Branch, MD;  Location: [facility];  Service: Ophthalmology;  Laterality: Right;  CDE:11.01   COLONOSCOPY     ESOPHAGOGASTRODUODENOSCOPY N/A 10/11/2014   RMR:non critical schatzkis ring/HH   EYE SURGERY     LUMBAR LAMINECTOMY     X 2   TONSILLECTOMY     FAMILY HISTORY Family History  Problem Relation Age of Onset   Colon cancer Other        no first degree relatives   Renal cancer Father    Heart  attack Father    Glaucoma Mother    Heart failure Mother    SOCIAL HISTORY Social History   Tobacco Use   Smoking status: Never   Smokeless tobacco: Never  Vaping Use   Vaping Use: Never used  Substance Use Topics   Alcohol use: No   Drug use: No       OPHTHALMIC EXAM: Base Eye Exam     Visual Acuity (Snellen - Linear)       Right Left   Dist [place] 20/25 20/25   Dist ph [place] 20/20 20/20         Tonometry (Tonopen, 10:00 AM)       Right Left   Pressure 13 15         Pupils       Pupils Dark Light Shape React APD   Right PERRL 3 2 Round Brisk None   Left PERRL 3 2 Round Brisk None         Visual Fields       Left Right    Full Full         Extraocular Movement       Right Left    Full, Ortho Full, Ortho         Neuro/Psych     Oriented x3: Yes   Mood/Affect: Normal         Dilation     Both eyes:            Slit Lamp and Fundus Exam     Slit Lamp Exam       Right Left   Lids/Lashes Dermatochalasis - upper lid, Ptosis Dermatochalasis - upper lid, Ptosis   Conjunctiva/Sclera mild temporal Pinguecula mild nasal Temporal Pinguecula   Cornea Arcus, trace Punctate epithelial erosions, Well healed cataract wounds Arcus, Well healed cataract wounds, tear film debris   Anterior Chamber Deep and quiet Deep and quiet   Iris Round and dilated Round and moderately dilated to 4.76m   Lens PC IOL in good position with open PC PC IOL in good position with open PC   Anterior Vitreous Vitreous syneresis, Posterior vitreous detachment mild Vitreous syneresis, Posterior vitreous detachment, vitreous condensations inferiorly         Fundus Exam       Right Left   Disc Pink and Sharp Pink and Sharp   C/D Ratio 0.3 0.4   Macula Blunted foveal reflex, trace cystic changes/edema - slightly increased; focal cluster of MA with IRH and exudate temporal fovea Flat, good foveal reflex, mild RPE mottling, trace Epiretinal membrane, rare MA, No edema    Vessels Vascular attenuation, mild tortuosity Vascular attenuation, mild tortuosity   Periphery Attached, no heme Attached, no heme  Refraction     Wearing Rx       Sphere Cylinder Add   Right -0.25 Sphere +2.25   Left -0.25 Sphere +2.25    Type: PAL           IMAGING AND PROCEDURES  Imaging and Procedures for _0 @  OCT, Retina - OU - Both Eyes       Right Eye Quality was good. Central Foveal Thickness: 284. Progression has been stable. Findings include no SRF, abnormal foveal contour, intraretinal hyper-reflective material, intraretinal fluid (persistent IRF / cystic changes temporal fovea, persistent focal IRHM ).   Left Eye Quality was good. Central Foveal Thickness: 266. Progression has been stable. Findings include normal foveal contour, no IRF, no SRF.   Notes *Images captured and stored on drive  Diagnosis / Impression:  OD: persistent IRF / cystic changes temporal fovea, persistent focal IRHM  OS: NFP, no IRF/SRF   Clinical management:  See below  Abbreviations: NFP - Normal foveal profile. CME - cystoid macular edema. PED - pigment epithelial detachment. IRF - intraretinal fluid. SRF - subretinal fluid. EZ - ellipsoid zone. ERM - epiretinal membrane. ORA - outer retinal atrophy. ORT - outer retinal tubulation. SRHM - subretinal hyper-reflective material            ASSESSMENT/PLAN:    ICD-10-CM   1. Hypertensive retinopathy of both eyes  H35.033 OCT, Retina - OU - Both Eyes    2. Essential hypertension  I10     3. Retinal hemorrhage of right eye  H35.61     4. Pseudophakia of both eyes  Z96.1      1-3. Hypertensive retinopathy OU  - focal intraretinal hemes in macula OD -- slightly worse today  - OCT shows persistent cystic changes OD, temporal fovea -- ?mild old BRVO  - BCVA remains 20/20 OU  - FA (05.05.21) shows focal MA in macula with late leakage OU    - BP  10.18.23 162/74 04.19.23 185/71  03.05.2021  167/72 R arm and  147/67 L arm  11.06.2020  147/67  07.06.2020 169/73  - meds adjusted by PCP and cardiology -- BP not checked by patient   - discussed importance of tight BP control  - monitor for now  - discussed potential for injections / focal laser OD if vision decreases  - f/u 3 months, sooner prn, DFE/OCT, possible injection  4. Pseudophakia OU  - s/p CE/IOL OU - Dr. Tonny Branch (2015)  - beautiful surgeries, doing well  - continue to monitor  Ophthalmic Meds Ordered this visit:  No orders of the defined types were placed in this encounter.    Return in about 3 months (around 12/17/2022) for f/u HTN ret OD, DFE, OCT.  There are no Patient Instructions on file for this visit.    This document serves as a record of services personally performed by Gardiner Sleeper, MD, PhD. It was created on their behalf by Roselee Nova, COMT. The creation of this record is the provider's dictation and/or activities during the visit.  Electronically signed by: Roselee Nova, COMT 09/17/22 1:25 PM  This document serves as a record of services personally performed by Gardiner Sleeper, MD, PhD. It was created on their behalf by San Jetty. Owens Shark, OA an ophthalmic technician. The creation of this record is the provider's dictation and/or activities during the visit.    Electronically signed by: San Jetty. Owens Shark, OA 12.11.2023 1:25 PM  Gardiner Sleeper, M.D., Ph.D. Diseases & Surgery of  the Retina and Vitreous [facility]  I have reviewed the above documentation for accuracy and completeness, and I agree with the above. Gardiner Sleeper, M.D., Ph.D. 09/17/22 1:26 PM   Abbreviations: M myopia (nearsighted); A astigmatism; H hyperopia (farsighted); P presbyopia; Mrx spectacle prescription;  CTL contact lenses; OD right eye; OS left eye; OU both eyes  XT exotropia; ET esotropia; PEK punctate epithelial keratitis; PEE punctate epithelial erosions; DES dry eye syndrome; MGD meibomian gland dysfunction;  ATs artificial tears; PFAT's preservative free artificial tears; [place] nuclear sclerotic cataract; PSC posterior subcapsular cataract; ERM epi-retinal membrane; PVD posterior vitreous detachment; RD retinal detachment; DM diabetes mellitus; DR diabetic retinopathy; NPDR non-proliferative diabetic retinopathy; PDR proliferative diabetic retinopathy; CSME clinically significant macular edema; DME diabetic macular edema; dbh dot blot hemorrhages; CWS cotton wool spot; POAG primary open angle glaucoma; C/D cup-to-disc ratio; HVF humphrey visual field; GVF goldmann visual field; OCT optical coherence tomography; IOP intraocular pressure; BRVO Branch retinal vein occlusion; CRVO central retinal vein occlusion; CRAO central retinal artery occlusion; BRAO branch retinal artery occlusion; RT retinal tear; SB scleral buckle; PPV pars plana vitrectomy; VH Vitreous hemorrhage; PRP panretinal laser photocoagulation; IVK intravitreal kenalog; VMT vitreomacular traction; MH Macular hole;  NVD neovascularization of the disc; NVE neovascularization elsewhere; AREDS age related eye disease study; ARMD age related macular degeneration; POAG primary open angle glaucoma; EBMD epithelial/anterior basement membrane dystrophy; ACIOL anterior chamber intraocular lens; IOL intraocular lens; PCIOL posterior chamber intraocular lens; Phaco/IOL phacoemulsification with intraocular lens placement; Energy photorefractive keratectomy; LASIK laser assisted in situ keratomileusis; HTN hypertension; DM diabetes mellitus; COPD chronic obstructive pulmonary disease

## 2022-09-12 ENCOUNTER — Encounter (INDEPENDENT_AMBULATORY_CARE_PROVIDER_SITE_OTHER): Payer: Medicare Other | Admitting: Ophthalmology

## 2022-09-17 ENCOUNTER — Ambulatory Visit (INDEPENDENT_AMBULATORY_CARE_PROVIDER_SITE_OTHER): Payer: Medicare Other | Admitting: Ophthalmology

## 2022-09-17 ENCOUNTER — Encounter (INDEPENDENT_AMBULATORY_CARE_PROVIDER_SITE_OTHER): Payer: Self-pay | Admitting: Ophthalmology

## 2022-09-17 DIAGNOSIS — I10 Essential (primary) hypertension: Secondary | ICD-10-CM

## 2022-09-17 DIAGNOSIS — H3561 Retinal hemorrhage, right eye: Secondary | ICD-10-CM | POA: Diagnosis not present

## 2022-09-17 DIAGNOSIS — H35033 Hypertensive retinopathy, bilateral: Secondary | ICD-10-CM | POA: Diagnosis not present

## 2022-09-17 DIAGNOSIS — Z961 Presence of intraocular lens: Secondary | ICD-10-CM | POA: Diagnosis not present

## 2022-10-09 DIAGNOSIS — M9902 Segmental and somatic dysfunction of thoracic region: Secondary | ICD-10-CM | POA: Diagnosis not present

## 2022-10-09 DIAGNOSIS — M542 Cervicalgia: Secondary | ICD-10-CM | POA: Diagnosis not present

## 2022-10-09 DIAGNOSIS — M47812 Spondylosis without myelopathy or radiculopathy, cervical region: Secondary | ICD-10-CM | POA: Diagnosis not present

## 2022-11-16 DIAGNOSIS — Z6824 Body mass index (BMI) 24.0-24.9, adult: Secondary | ICD-10-CM | POA: Diagnosis not present

## 2022-11-16 DIAGNOSIS — E871 Hypo-osmolality and hyponatremia: Secondary | ICD-10-CM | POA: Diagnosis not present

## 2022-11-16 DIAGNOSIS — E039 Hypothyroidism, unspecified: Secondary | ICD-10-CM | POA: Diagnosis not present

## 2022-11-16 DIAGNOSIS — H3560 Retinal hemorrhage, unspecified eye: Secondary | ICD-10-CM | POA: Diagnosis not present

## 2022-12-06 ENCOUNTER — Encounter: Payer: Self-pay | Admitting: Radiology

## 2022-12-12 NOTE — Progress Notes (Signed)
[facility] Note  12/17/2022     CHIEF COMPLAINT Patient presents for Retina Follow Up    HISTORY OF PRESENT ILLNESS: Kaitlyn Sanchez is a 88 y.o. female who presents to the clinic today for:   HPI     Retina Follow Up   In both eyes.  This started 3 months ago.  Duration of 3 months.  Since onset it is stable.  I, the attending physician,  performed the HPI with the patient and updated documentation appropriately.        Comments   3 month retina follow up pt states no vision changes noticed she has floaters at times but denies flashes of light       Last edited by Bernarda Caffey, MD on 12/17/2022 11:50 AM.    Pt states her BP is usually around 130 at home, pt states she has been having to take salt tablets this week bc of her hyponatremia   Referring physician: [name], [address] [place],  [place] [address]  HISTORICAL INFORMATION:   Selected notes from the MEDICAL RECORD NUMBER Referred by Dr. Madelin Headings for concern of diabetic retinopathy LEE: 03.11.20 Jerilynn Mages. 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 (Ophthalmic Drugs)   No current facility-administered medications for this visit. (Ophthalmic Drugs)   Current Outpatient Medications (Other)  Medication Sig   amLODipine (NORVASC) 10 MG tablet Take 10 mg by mouth every evening.    aspirin EC 81 MG tablet Take 243 mg by mouth at bedtime.   Calcium Carbonate-Vit D-Min (CALTRATE 600+D PLUS PO) Take 1 tablet by mouth daily.   Chelated Potassium 99 MG TABS Take 1 tablet by mouth daily.   Cholecalciferol (VITAMIN D3) 125 MCG (5000 UT) CAPS Take 1 capsule by mouth daily.   docusate sodium (COLACE) 100 MG capsule Take 300 mg by mouth at bedtime.    Flaxseed, Linseed, (FLAXSEED OIL) 1000 MG CAPS Take 1,000 mg by  mouth daily.    glucosamine-chondroitin 500-400 MG tablet Take 1 tablet by mouth daily.   labetalol (NORMODYNE) 100 MG tablet Take 1 tablet by mouth 3 (three) times daily.   levothyroxine (SYNTHROID) 100 MCG tablet TAKE 1 TABLET BY MOUTH ONCE A DAY.   LORazepam (ATIVAN) 1 MG tablet Take 0.5 mg by mouth 2 (two) times daily.   multivitamin-iron-minerals-folic acid (CENTRUM) chewable tablet Chew 1 tablet by mouth daily.   olmesartan (BENICAR) 40 MG tablet Take 40 mg by mouth daily.   Omega 3 1200 MG CAPS Take 1,200 mg by mouth daily.    vitamin B-12 (CYANOCOBALAMIN) 1000 MCG tablet Take 1,000 mcg by mouth daily.   vitamin C (ASCORBIC ACID) 500 MG tablet Take 500 mg by mouth daily.   zinc gluconate 50 MG tablet Take 50 mg by mouth daily.   No current facility-administered medications for this visit. (Other)   REVIEW OF SYSTEMS: ROS   Positive for: Gastrointestinal, Eyes, Respiratory Negative for: Constitutional, Neurological, Skin, Genitourinary, Musculoskeletal, HENT, Endocrine, Cardiovascular, Psychiatric, Allergic/Imm, Heme/Lymph Last edited by Parthenia Ames, COT on 12/17/2022 10:01 AM.      ALLERGIES Allergies  Allergen Reactions   Levaquin [Levofloxacin] Other (See Comments)    Seizures   PAST MEDICAL HISTORY Past Medical History:  Diagnosis Date   Essential hypertension    GERD (gastroesophageal reflux disease)  History of cardiac catheterization 2013   Mild coronary atherosclerosis - [place]   Hyperlipidemia    Hypertensive retinopathy    Hypothyroidism    Past Surgical History:  Procedure Laterality Date   APPENDECTOMY     CARPAL TUNNEL RELEASE     CATARACT EXTRACTION W/PHACO Left 12/28/2013   Procedure: CATARACT EXTRACTION PHACO AND INTRAOCULAR LENS PLACEMENT ([place]);  Surgeon: Tonny Branch, MD;  Location: [facility];  Service: Ophthalmology;  Laterality: Left;  CDE 18.64   CATARACT EXTRACTION W/PHACO Right 01/07/2014   Procedure: CATARACT EXTRACTION PHACO AND  INTRAOCULAR LENS PLACEMENT ([facility]);  Surgeon: Tonny Branch, MD;  Location: [facility];  Service: Ophthalmology;  Laterality: Right;  CDE:11.01   COLONOSCOPY     ESOPHAGOGASTRODUODENOSCOPY N/A 10/11/2014   RMR:non critical schatzkis ring/HH   EYE SURGERY     LUMBAR LAMINECTOMY     X 2   TONSILLECTOMY     FAMILY HISTORY Family History  Problem Relation Age of Onset   Colon cancer Other        no first degree relatives   Renal cancer Father    Heart attack Father    Glaucoma Mother    Heart failure Mother    SOCIAL HISTORY Social History   Tobacco Use   Smoking status: Never   Smokeless tobacco: Never  Vaping Use   Vaping Use: Never used  Substance Use Topics   Alcohol use: No   Drug use: No       OPHTHALMIC EXAM: Base Eye Exam     Visual Acuity (Snellen - Linear)       Right Left   Dist [place] 20/20 -1 20/25 -1    Correction: Glasses         Tonometry (Tonopen, 10:05 AM)       Right Left   Pressure 12 12         Pupils       Pupils Dark Light Shape React APD   Right PERRL 3 2 Round Brisk None   Left PERRL 3 2 Round Brisk None         Visual Fields       Left Right    Full Full         Extraocular Movement       Right Left    Full, Ortho Full, Ortho         Neuro/Psych     Oriented x3: Yes   Mood/Affect: Normal           Slit Lamp and Fundus Exam     Slit Lamp Exam       Right Left   Lids/Lashes Dermatochalasis - upper lid, Ptosis Dermatochalasis - upper lid, Ptosis   Conjunctiva/Sclera mild temporal Pinguecula mild nasal Temporal Pinguecula   Cornea Arcus, 1+ fine Punctate epithelial erosions, Well healed cataract wounds Arcus, Well healed cataract wounds, trace PEE   Anterior Chamber deep and clear deep and clear   Iris Round and dilated Round and dilated   Lens PC IOL in good position with open PC PC IOL in good position with open PC   Anterior Vitreous syneresis, Posterior vitreous detachment Vitreous syneresis, Posterior  vitreous detachment, vitreous condensations inferiorly         Fundus Exam       Right Left   Disc Pink and Sharp Pink and Sharp   C/D Ratio 0.3 0.4   Macula Blunted foveal reflex, trace cystic changes/edema - slightly improved; focal cluster of MA, IRH  and exudate temporal fovea Flat, good foveal reflex, mild RPE mottling, trace Epiretinal membrane, rare MA, No edema   Vessels attenuated, mild tortuosity attenuated, mild tortuosity   Periphery Attached, rare MA Attached, no heme           Refraction     Wearing Rx       Sphere Cylinder Add   Right -0.25 Sphere +2.25   Left -0.25 Sphere +2.25    Type: PAL           IMAGING AND PROCEDURES  Imaging and Procedures for '@TODAY'$ @  OCT, Retina - OU - Both Eyes       Right Eye Quality was good. Central Foveal Thickness: 281. Progression has improved. Findings include no SRF, abnormal foveal contour, intraretinal hyper-reflective material, intraretinal fluid (Mild interval improvement in IRF / cystic changes and focal IRHM temporal fovea).   Left Eye Quality was good. Central Foveal Thickness: 265. Progression has been stable. Findings include normal foveal contour, no IRF, no SRF.   Notes *Images captured and stored on drive  Diagnosis / Impression:  OD: Mild interval improvement in IRF / cystic changes and focal IRHM temporal fovea OS: NFP, no IRF/SRF   Clinical management:  See below  Abbreviations: NFP - Normal foveal profile. CME - cystoid macular edema. PED - pigment epithelial detachment. IRF - intraretinal fluid. SRF - subretinal fluid. EZ - ellipsoid zone. ERM - epiretinal membrane. ORA - outer retinal atrophy. ORT - outer retinal tubulation. SRHM - subretinal hyper-reflective material            ASSESSMENT/PLAN:    ICD-10-CM   1. Hypertensive retinopathy of both eyes  H35.033 OCT, Retina - OU - Both Eyes    2. Essential hypertension  I10     3. Retinal hemorrhage of right eye  H35.61     4.  Pseudophakia of both eyes  Z96.1      1-3. Hypertensive retinopathy OU  - persistent focal intraretinal hemes in macula OD -- slightly improved today  - OCT shows persistent cystic changes OD, temporal fovea -- slightly improved -- ?mild old BRVO  - BCVA remains 20/20 OU  - FA (05.05.21) shows focal MA in macula with late leakage OU    - BP  03.11.24 176/74 10.18.23 162/74 04.19.23 185/71  03.05.2021  167/72 R arm and 147/67 L arm  11.06.2020  147/67  07.06.2020 169/73  - meds adjusted by PCP and cardiology -- BP not checked by patient   - discussed importance of tight BP control  - monitor for now  - discussed potential for injections / focal laser OD if vision decreases  - f/u 4-6 months, sooner prn, DFE/OCT, possible injection  4. Pseudophakia OU  - s/p CE/IOL OU - Dr. Tonny Branch (2015)  - beautiful surgeries, doing well  - continue to monitor  Ophthalmic Meds Ordered this visit:  No orders of the defined types were placed in this encounter.    Return for f/u 4-6 months, HTN ret OU, DFE, OCT.  There are no Patient Instructions on file for this visit.    This document serves as a record of services personally performed by Gardiner Sleeper, MD, PhD. It was created on their behalf by Roselee Nova, COMT. The creation of this record is the provider's dictation and/or activities during the visit.  Electronically signed by: Roselee Nova, COMT 12/17/22 11:51 AM  This document serves as a record of services personally performed by Gardiner Sleeper, MD, PhD.  It was created on their behalf by San Jetty. Owens Shark, OA an ophthalmic technician. The creation of this record is the provider's dictation and/or activities during the visit.    Electronically signed by: San Jetty. Owens Shark, [place] 03.11.2024 11:51 AM  Gardiner Sleeper, M.D., Ph.D. Diseases & Surgery of the Retina and Vitreous [facility]  I have reviewed the above documentation for accuracy and completeness, and I  agree with the above. Gardiner Sleeper, M.D., Ph.D. 12/17/22 11:53 AM  Abbreviations: M myopia (nearsighted); A astigmatism; H hyperopia (farsighted); P presbyopia; Mrx spectacle prescription;  CTL contact lenses; OD right eye; OS left eye; OU both eyes  XT exotropia; ET esotropia; PEK punctate epithelial keratitis; PEE punctate epithelial erosions; DES dry eye syndrome; MGD meibomian gland dysfunction; ATs artificial tears; PFAT's preservative free artificial tears; [place] nuclear sclerotic cataract; PSC posterior subcapsular cataract; ERM epi-retinal membrane; PVD posterior vitreous detachment; RD retinal detachment; DM diabetes mellitus; DR diabetic retinopathy; NPDR non-proliferative diabetic retinopathy; PDR proliferative diabetic retinopathy; CSME clinically significant macular edema; DME diabetic macular edema; dbh dot blot hemorrhages; CWS cotton wool spot; POAG primary open angle glaucoma; C/D cup-to-disc ratio; HVF humphrey visual field; GVF goldmann visual field; OCT optical coherence tomography; IOP intraocular pressure; BRVO Branch retinal vein occlusion; CRVO central retinal vein occlusion; CRAO central retinal artery occlusion; BRAO branch retinal artery occlusion; RT retinal tear; SB scleral buckle; PPV pars plana vitrectomy; VH Vitreous hemorrhage; PRP panretinal laser photocoagulation; IVK intravitreal kenalog; VMT vitreomacular traction; MH Macular hole;  NVD neovascularization of the disc; NVE neovascularization elsewhere; AREDS age related eye disease study; ARMD age related macular degeneration; POAG primary open angle glaucoma; EBMD epithelial/anterior basement membrane dystrophy; ACIOL anterior chamber intraocular lens; IOL intraocular lens; PCIOL posterior chamber intraocular lens; Phaco/IOL phacoemulsification with intraocular lens placement; [place] photorefractive keratectomy; LASIK laser assisted in situ keratomileusis; HTN hypertension; DM diabetes mellitus; COPD chronic obstructive  pulmonary disease

## 2022-12-17 ENCOUNTER — Encounter (INDEPENDENT_AMBULATORY_CARE_PROVIDER_SITE_OTHER): Payer: Self-pay | Admitting: Ophthalmology

## 2022-12-17 ENCOUNTER — Ambulatory Visit (INDEPENDENT_AMBULATORY_CARE_PROVIDER_SITE_OTHER): Payer: Medicare Other | Admitting: Ophthalmology

## 2022-12-17 VITALS — BP 176/74 | HR 62

## 2022-12-17 DIAGNOSIS — Z961 Presence of intraocular lens: Secondary | ICD-10-CM

## 2022-12-17 DIAGNOSIS — H3561 Retinal hemorrhage, right eye: Secondary | ICD-10-CM

## 2022-12-17 DIAGNOSIS — I10 Essential (primary) hypertension: Secondary | ICD-10-CM

## 2022-12-17 DIAGNOSIS — H35033 Hypertensive retinopathy, bilateral: Secondary | ICD-10-CM

## 2022-12-20 ENCOUNTER — Ambulatory Visit: Payer: Medicare Other | Admitting: "Endocrinology

## 2022-12-26 DIAGNOSIS — E039 Hypothyroidism, unspecified: Secondary | ICD-10-CM | POA: Diagnosis not present

## 2023-03-12 ENCOUNTER — Ambulatory Visit (HOSPITAL_COMMUNITY)
Admission: RE | Admit: 2023-03-12 | Discharge: 2023-03-12 | Disposition: A | Discharge status: Home / Self Care | Payer: Medicare Other | Source: Ambulatory Visit | Attending: Family Medicine | Admitting: Family Medicine

## 2023-03-12 ENCOUNTER — Other Ambulatory Visit (HOSPITAL_COMMUNITY): Payer: Self-pay | Admitting: Family Medicine

## 2023-03-12 DIAGNOSIS — M8588 Other specified disorders of bone density and structure, other site: Secondary | ICD-10-CM | POA: Diagnosis not present

## 2023-03-12 DIAGNOSIS — M5136 Other intervertebral disc degeneration, lumbar region: Secondary | ICD-10-CM | POA: Diagnosis not present

## 2023-03-12 DIAGNOSIS — Z6824 Body mass index (BMI) 24.0-24.9, adult: Secondary | ICD-10-CM | POA: Diagnosis not present

## 2023-03-14 ENCOUNTER — Other Ambulatory Visit (HOSPITAL_COMMUNITY): Payer: Self-pay | Admitting: Family Medicine

## 2023-03-14 DIAGNOSIS — Z1231 Encounter for screening mammogram for malignant neoplasm of breast: Secondary | ICD-10-CM

## 2023-03-26 ENCOUNTER — Other Ambulatory Visit (HOSPITAL_COMMUNITY): Payer: Self-pay | Admitting: Family Medicine

## 2023-03-26 DIAGNOSIS — M5136 Other intervertebral disc degeneration, lumbar region: Secondary | ICD-10-CM

## 2023-03-26 DIAGNOSIS — M545 Low back pain, unspecified: Secondary | ICD-10-CM

## 2023-04-01 ENCOUNTER — Ambulatory Visit: Payer: Self-pay | Admitting: *Deleted

## 2023-04-01 NOTE — Patient Outreach (Signed)
  Care Coordination   04/01/2023  Name: Kaitlyn Sanchez MRN: [id number] DOB: 1935-08-13   Care Coordination Outreach Attempts:  An unsuccessful telephone outreach was attempted today to offer the patient information about available care coordination services.HIPAA compliant messages left on voicemail, providing contact information for CSW, encouraging patient to return CSW's call at her earliest convenience.  Follow Up Plan:  Additional outreach attempts will be made to offer the patient care coordination information and services.   Encounter Outcome:  No Answer.   Care Coordination Interventions:  No, not indicated.    Danford Bad, BSW, MSW, LCSW  Licensed Restaurant manager, [employer] food [facility]  Mailing [place] [address] [place], [place] [address] Physical Address[address] [place], [place] [address] Toll Free Main # [phone] Fax # [phone] Cell # [phone] [email or web address]

## 2023-04-04 ENCOUNTER — Ambulatory Visit: Payer: Self-pay | Admitting: *Deleted

## 2023-04-04 ENCOUNTER — Encounter: Payer: Self-pay | Admitting: *Deleted

## 2023-04-04 NOTE — Patient Outreach (Signed)
Care Coordination   Initial Visit Note   04/04/2023  Name: Kaitlyn Sanchez MRN: [id number] DOB: 1935-01-02  Kaitlyn Sanchez is a 88 y.o. year old female who sees Assunta Found, MD for primary care. I spoke with Kaitlyn Sanchez by phone today.  What matters to the patients health and wellness today?  No interventions identified. Patient denies need for social work involvement at this time.   Goals Addressed             This Visit's Progress    COMPLETED: Segmented List Assessment of Needs.   On track    Care Coordination Interventions:  Interventions Today    Flowsheet Row Most Recent Value  Chronic Disease   Chronic disease during today's visit Chronic Obstructive Pulmonary Disease (COPD), Hypertension (HTN), Other  [Chronic Back Pain]  General Interventions   General Interventions Discussed/Reviewed General Interventions Discussed, Labs, Vaccines, Doctor Visits, General Interventions Reviewed, Annual Eye Exam, Durable Medical Equipment (DME), [employer], Level of Care, Communication with, Lipid Profile, Health Screening, Annual Foot Exam  [Encouraged]  Labs Kidney Function, Hgb A1c annually  [Encouraged]  Vaccines COVID-19, Flu, Pneumonia, RSV, Shingles, Tetanus/Pertussis/Diphtheria  [Encouraged]  Doctor Visits Discussed/Reviewed PCP, Annual Wellness Visits, Doctor Visits Discussed, Specialist, Doctor Visits Reviewed  [Encouraged]  Health Screening Bone Density, Colonoscopy, Mammogram  [Encouraged]  Durable Medical Equipment (DME) BP Cuff, Other  [Cane, Eyeglasses, Blood Pressure Cuff]  PCP/Specialist Visits Compliance with follow-up visit  [Encouraged]  Communication with PCP/Specialists  [Encouraged]  Level of Care Adult Daycare, Assisted Living, Personal Care Services, Applications  [Encouraged]  Applications Medicaid, Personal Care Services  [Encouraged]  Exercise Interventions   Exercise Discussed/Reviewed Exercise Discussed, Assistive device use and maintanence,  Exercise Reviewed, Physical Activity, Weight Managment  [Encouraged]  Physical Activity Discussed/Reviewed Physical Activity Discussed, Home Exercise Program (HEP), Physical Activity Reviewed, Types of exercise, Gym  [Encouraged]  Weight Management Weight maintenance  [Encouraged]  Education Interventions   Education Provided Provided Education  [Encouraged]  Provided Verbal Education On Nutrition, Mental Health/Coping with Illness, When to see the doctor, Foot Care, Eye Care, Applications, Exercise, Medication, Insurance Plans, [employer], Labs  [Encouraged]  [employer], Personal Care Services  [Encouraged]  Mental Health Interventions   Mental Health Discussed/Reviewed Mental Health Discussed, Anxiety, Mental Health Reviewed, Grief and Loss, Depression, Substance Abuse, Coping Strategies, Crisis, Suicide, Other  [Domestic Violence]  Nutrition Interventions   Nutrition Discussed/Reviewed Nutrition Discussed, Adding fruits and vegetables, Increasing proteins, Decreasing fats, Decreasing salt, Supplemental nutrition, Decreasing sugar intake, Portion sizes, Carbohydrate meal planning, Nutrition Reviewed, Fluid intake  [Encouraged]  Pharmacy Interventions   Pharmacy Dicussed/Reviewed Pharmacy Topics Discussed, Pharmacy Topics Reviewed, Medication Adherence, Affording Medications  [Encouraged]  Safety Interventions   Safety Discussed/Reviewed Safety Discussed, Safety Reviewed  [Encouraged]  Advanced Directive Interventions   Advanced Directives Discussed/Reviewed Advanced Directives Discussed  [Completed]     Danford Bad, BSW, MSW, LCSW  Licensed Clinical Social Worker  [facility]  Mailing [place] [address], [place], [place] [address] Physical Address[address] [place], [place] [address] Toll Free Main # [phone] Fax # [phone] Cell # [phone] [email or web address]        SDOH  assessments and interventions completed:  Yes.  SDOH Interventions Today    Flowsheet Row Most Recent Value  SDOH Interventions   Food Insecurity Interventions Intervention Not Indicated  Housing Interventions Intervention Not Indicated  Transportation Interventions Intervention Not Indicated, Patient Resources (Friends/Family)  Utilities Interventions Intervention Not Indicated  Alcohol Usage Interventions Intervention Not Indicated (Score <7)  Financial Strain Interventions Intervention Not Indicated  Physical Activity Interventions Intervention Not Indicated  Stress Interventions Intervention Not Indicated  Social Connections Interventions Intervention Not Indicated     Care Coordination Interventions:  No, not indicated.   Follow up plan: No further intervention required.   Encounter Outcome:  Pt. Visit Completed.   Danford Bad, BSW, MSW, LCSW  Licensed Restaurant manager, fast food [facility]  Mailing [place] [address] [place], [place] [address] Physical Address[address] [place], [place] [address] Toll Free Main # [phone] Fax # [phone] Cell # [phone] [email or web address]

## 2023-04-04 NOTE — Patient Instructions (Signed)
Visit Information  Thank you for taking time to visit with me today. Please don't hesitate to contact me if I can be of assistance to you.   Following are the goals we discussed today:   Goals Addressed             This Visit's Progress    COMPLETED: Segmented List Assessment of Needs.   On track    Care Coordination Interventions:  Interventions Today    Flowsheet Row Most Recent Value  Chronic Disease   Chronic disease during today's visit Chronic Obstructive Pulmonary Disease (COPD), Hypertension (HTN), Other  [Chronic Back Pain]  General Interventions   General Interventions Discussed/Reviewed General Interventions Discussed, Labs, Vaccines, Doctor Visits, General Interventions Reviewed, Annual Eye Exam, Durable Medical Equipment (DME), [employer], Level of Care, Communication with, Lipid Profile, Health Screening, Annual Foot Exam  [Encouraged]  Labs Kidney Function, Hgb A1c annually  [Encouraged]  Vaccines COVID-19, Flu, Pneumonia, RSV, Shingles, Tetanus/Pertussis/Diphtheria  [Encouraged]  Doctor Visits Discussed/Reviewed PCP, Annual Wellness Visits, Doctor Visits Discussed, Specialist, Doctor Visits Reviewed  [Encouraged]  Health Screening Bone Density, Colonoscopy, Mammogram  [Encouraged]  Durable Medical Equipment (DME) BP Cuff, Other  [Cane, Eyeglasses, Blood Pressure Cuff]  PCP/Specialist Visits Compliance with follow-up visit  [Encouraged]  Communication with PCP/Specialists  [Encouraged]  Level of Care Adult Daycare, Assisted Living, Personal Care Services, Applications  [Encouraged]  Applications Medicaid, Personal Care Services  [Encouraged]  Exercise Interventions   Exercise Discussed/Reviewed Exercise Discussed, Assistive device use and maintanence, Exercise Reviewed, Physical Activity, Weight Managment  [Encouraged]  Physical Activity Discussed/Reviewed Physical Activity Discussed, Home Exercise Program (HEP), Physical Activity Reviewed, Types of exercise,  Gym  [Encouraged]  Weight Management Weight maintenance  [Encouraged]  Education Interventions   Education Provided Provided Education  [Encouraged]  Provided Verbal Education On Nutrition, Mental Health/Coping with Illness, When to see the doctor, Foot Care, Eye Care, Applications, Exercise, Medication, Insurance Plans, [employer], Labs  [Encouraged]  [employer], Personal Care Services  [Encouraged]  Mental Health Interventions   Mental Health Discussed/Reviewed Mental Health Discussed, Anxiety, Mental Health Reviewed, Grief and Loss, Depression, Substance Abuse, Coping Strategies, Crisis, Suicide, Other  [Domestic Violence]  Nutrition Interventions   Nutrition Discussed/Reviewed Nutrition Discussed, Adding fruits and vegetables, Increasing proteins, Decreasing fats, Decreasing salt, Supplemental nutrition, Decreasing sugar intake, Portion sizes, Carbohydrate meal planning, Nutrition Reviewed, Fluid intake  [Encouraged]  Pharmacy Interventions   Pharmacy Dicussed/Reviewed Pharmacy Topics Discussed, Pharmacy Topics Reviewed, Medication Adherence, Affording Medications  [Encouraged]  Safety Interventions   Safety Discussed/Reviewed Safety Discussed, Safety Reviewed  [Encouraged]  Advanced Directive Interventions   Advanced Directives Discussed/Reviewed Advanced Directives Discussed  [Completed]     Danford Bad, BSW, MSW, LCSW  Licensed Clinical Social Worker  [facility]  Mailing [place] [address], [place], [place] [address] Physical Address[address] [place], [place] [address] Toll Free Main # [phone] Fax # [phone] Cell # [phone] [email or web address]      Please call the care guide team at [phone] if you need to cancel or reschedule your appointment.   If you are experiencing a Mental Health or Behavioral Health Crisis or need someone to talk to, please call the Suicide and  Crisis Lifeline: 988 call the [place] National Suicide Prevention Lifeline: (203)323-6128 or TTY: 458 286 6842 TTY 306-689-4877) to talk to a trained counselor call 1-800-273-TALK (toll free, 24 hour hotline) go to [facility] [address], [place] [phone]) call the [facility] Crisis Line:  [phone] call 911  Patient verbalizes understanding of instructions and care plan provided today and agrees to view in MyChart. Active MyChart status and patient understanding of how to access instructions and care plan via MyChart confirmed with patient.     No further follow up required.  Danford Bad, BSW, MSW, LCSW  Licensed Restaurant manager, fast food [facility]  Mailing [place] [address] [place], [place] [address] Physical Address[address] [place], [place] [address] Toll Free Main # [phone] Fax # [phone] Cell # [phone] [email or web address]

## 2023-04-17 NOTE — Progress Notes (Signed)
[facility] - Clinic Note  04/22/2023     CHIEF COMPLAINT Patient presents for Retina Follow Up    HISTORY OF PRESENT ILLNESS: Kaitlyn Sanchez is a 88 y.o. female who presents to the clinic today for:   HPI     Retina Follow Up   Patient presents with  Other.  In both eyes.  This started 4 months ago.  I, the attending physician,  performed the HPI with the patient and updated documentation appropriately.        Comments   Patient here for 9 month retina follow up for Htn Ret OU. Patient states vision doing ok. No eye pain. Has pain left brow area. Then went away. Uses OTC allergy and AT everyday.      Last edited by Rennis Chris, MD on 04/22/2023 12:25 PM.     Pt states everything with her health and vision seems to be okay   Referring physician: Assunta Found, MD [address] [place],  [place] [address]  HISTORICAL INFORMATION:   Selected notes from the MEDICAL RECORD NUMBER Referred by Dr. Daisy Lazar for concern of diabetic retinopathy LEE: 03.11.20 (M. Cotter) [BCVA: OD: 20/20- OS: 20/20-] Ocular Hx-pseudo OU (Dr. Gemma Payor, 2015), HTN ret, vitreous degeneration PMH-HTN, HLD, hypothyroidism    CURRENT MEDICATIONS: No current outpatient medications on file. (Ophthalmic Drugs)   No current facility-administered medications for this visit. (Ophthalmic Drugs)   Current Outpatient Medications (Other)  Medication Sig   amLODipine (NORVASC) 10 MG tablet Take 10 mg by mouth every evening.    aspirin EC 81 MG tablet Take 243 mg by mouth at bedtime.   Calcium Carbonate-Vit D-Min (CALTRATE 600+D PLUS PO) Take 1 tablet by mouth daily.   Chelated Potassium 99 MG TABS Take 1 tablet by mouth daily.   Cholecalciferol (VITAMIN D3) 125 MCG (5000 UT) CAPS Take 1 capsule by mouth daily.   docusate sodium (COLACE) 100 MG capsule Take 300 mg by mouth at bedtime.    Flaxseed, Linseed, (FLAXSEED OIL) 1000 MG CAPS Take 1,000 mg by mouth daily.     glucosamine-chondroitin 500-400 MG tablet Take 1 tablet by mouth daily.   labetalol (NORMODYNE) 100 MG tablet Take 1 tablet by mouth 3 (three) times daily.   levothyroxine (SYNTHROID) 100 MCG tablet TAKE 1 TABLET BY MOUTH ONCE A DAY. (Patient taking differently: Take 110 mcg by mouth daily.)   LORazepam (ATIVAN) 1 MG tablet Take 0.5 mg by mouth 2 (two) times daily.   multivitamin-iron-minerals-folic acid (CENTRUM) chewable tablet Chew 1 tablet by mouth daily.   olmesartan (BENICAR) 40 MG tablet Take 40 mg by mouth daily.   Omega 3 1200 MG CAPS Take 1,200 mg by mouth daily.    vitamin B-12 (CYANOCOBALAMIN) 1000 MCG tablet Take 1,000 mcg by mouth daily.   vitamin C (ASCORBIC ACID) 500 MG tablet Take 500 mg by mouth daily.   zinc gluconate 50 MG tablet Take 50 mg by mouth daily.   No current facility-administered medications for this visit. (Other)   REVIEW OF SYSTEMS: ROS   Positive for: Gastrointestinal, Eyes, Respiratory Negative for: Constitutional, Neurological, Skin, Genitourinary, Musculoskeletal, HENT, Endocrine, Cardiovascular, Psychiatric, Allergic/Imm, Heme/Lymph Last edited by Laddie Aquas, COA on 04/22/2023  9:56 AM.     ALLERGIES Allergies  Allergen Reactions   Levaquin [Levofloxacin] Other (See Comments)    Seizures   PAST MEDICAL HISTORY Past Medical History:  Diagnosis Date   Essential hypertension    GERD (gastroesophageal reflux  disease)    History of cardiac catheterization 2013   Mild coronary atherosclerosis - [facility]   Hyperlipidemia    Hypertensive retinopathy    Hypothyroidism    Past Surgical History:  Procedure Laterality Date   APPENDECTOMY     CARPAL TUNNEL RELEASE     CATARACT EXTRACTION W/PHACO Left 12/28/2013   Procedure: CATARACT EXTRACTION PHACO AND INTRAOCULAR LENS PLACEMENT (IOC);  Surgeon: Gemma Payor, MD;  Location: [facility];  Service: Ophthalmology;  Laterality: Left;  CDE 18.64   CATARACT EXTRACTION W/PHACO Right 01/07/2014   Procedure:  CATARACT EXTRACTION PHACO AND INTRAOCULAR LENS PLACEMENT (IOC);  Surgeon: Gemma Payor, MD;  Location: [facility];  Service: Ophthalmology;  Laterality: Right;  CDE:11.01   COLONOSCOPY     ESOPHAGOGASTRODUODENOSCOPY N/A 10/11/2014   RMR:non critical schatzkis ring/HH   EYE SURGERY     LUMBAR LAMINECTOMY     X 2   TONSILLECTOMY     FAMILY HISTORY Family History  Problem Relation Age of Onset   Colon cancer Other        no first degree relatives   Renal cancer Father    Heart attack Father    Glaucoma Mother    Heart failure Mother    SOCIAL HISTORY Social History   Tobacco Use   Smoking status: Never    Passive exposure: Never   Smokeless tobacco: Never  Vaping Use   Vaping status: Never Used  Substance Use Topics   Alcohol use: No   Drug use: No       OPHTHALMIC EXAM: Base Eye Exam     Visual Acuity (Snellen - Linear)       Right Left   Dist [place] 20/20 20/20         Tonometry (Tonopen, 9:53 AM)       Right Left   Pressure 15 16         Pupils       Dark Light Shape React APD   Right 3 2 Round Brisk None   Left 3 2 Round Brisk None         Visual Fields (Counting fingers)       Left Right    Full Full         Extraocular Movement       Right Left    Full, Ortho Full, Ortho         Neuro/Psych     Oriented x3: Yes   Mood/Affect: Normal         Dilation     Both eyes: 1.0% Mydriacyl, 2.5% Phenylephrine @ 9:53 AM           Slit Lamp and Fundus Exam     Slit Lamp Exam       Right Left   Lids/Lashes Dermatochalasis - upper lid, Ptosis Dermatochalasis - upper lid, Ptosis   Conjunctiva/Sclera mild temporal Pinguecula mild nasal Temporal Pinguecula   Cornea Arcus, 1-2+ fine Punctate epithelial erosions, Well healed cataract wounds Arcus, Well healed cataract wounds, 1+ fine PEE   Anterior Chamber deep and clear deep and clear   Iris Round and moderately dilated Round and dilated   Lens PC IOL in good position with open PC PC IOL  in good position with open PC   Anterior Vitreous syneresis, Posterior vitreous detachment Vitreous syneresis, Posterior vitreous detachment, vitreous condensations inferiorly         Fundus Exam       Right Left   Disc Pink and Sharp  Pink and Sharp   C/D Ratio 0.4 0.4   Macula Blunted foveal, focal cluster of MA and exudate temporal fovea with interval increase in edema Flat, good foveal reflex, mild RPE mottling, trace Epiretinal membrane, rare MA, No edema   Vessels attenuated, mild tortuosity attenuated, mild tortuosity   Periphery Attached, No heme Attached, no heme           Refraction     Wearing Rx       Sphere Cylinder Add   Right -0.25 Sphere +2.25   Left -0.25 Sphere +2.25    Type: PAL           IMAGING AND PROCEDURES  Imaging and Procedures for @TODAY @  OCT, Retina - OU - Both Eyes       Right Eye Quality was good. Central Foveal Thickness: 305. Progression has worsened. Findings include no SRF, abnormal foveal contour, intraretinal hyper-reflective material, intraretinal fluid (interval increase in IRF / cystic changes and focal IRHM IT fovea and mac).   Left Eye Quality was good. Central Foveal Thickness: 265. Progression has been stable. Findings include normal foveal contour, no IRF, no SRF.   Notes *Images captured and stored on drive  Diagnosis / Impression:  OD: Mild interval increase in IRF / cystic changes and focal IRHM IT fovea and mac OS: NFP, no IRF/SRF   Clinical management:  See below  Abbreviations: NFP - Normal foveal profile. CME - cystoid macular edema. PED - pigment epithelial detachment. IRF - intraretinal fluid. SRF - subretinal fluid. EZ - ellipsoid zone. ERM - epiretinal membrane. ORA - outer retinal atrophy. ORT - outer retinal tubulation. SRHM - subretinal hyper-reflective material             ASSESSMENT/PLAN:    ICD-10-CM   1. Hypertensive retinopathy of both eyes  H35.033 OCT, Retina - OU - Both Eyes    2.  Essential hypertension  I10     3. Retinal hemorrhage of right eye  H35.61 OCT, Retina - OU - Both Eyes    4. Pseudophakia of both eyes  Z96.1       1-3. Hypertensive retinopathy OU  - persistent focal intraretinal hemes in macula OD -- slightly increased today  - OCT shows interval increase in cystic changes OD IT fovea and mac -- ?mild old BRVO -- 9 mos since last exam  - BCVA remains 20/20 OU  - FA (05.05.21) shows focal MA in macula with late leakage OU    - BP  03.11.24 176/74 10.18.23 162/74 04.19.23 185/71  03.05.2021  167/72 R arm and 147/67 L arm  11.06.2020  147/67  07.06.2020 169/73  - meds adjusted by PCP and cardiology -- BP not checked by patient   - discussed importance of tight BP control  - monitor for now  - discussed potential for injections / focal laser OD if vision decreases  - f/u 3-4 months, sooner prn, DFE/OCT, possible injection  4. Pseudophakia OU  - s/p CE/IOL OU - Dr. Gemma Payor (2015)  - beautiful surgeries, doing well  - continue to monitor  Ophthalmic Meds Ordered this visit:  No orders of the defined types were placed in this encounter.    Return for f/u 3-4 months HTN ret OU, DFE, OCT.  There are no Patient Instructions on file for this visit.    This document serves as a record of services personally performed by Karie Chimera, MD, PhD. It was created on their behalf by Annalee Genta, COMT.  The creation of this record is the provider's dictation and/or activities during the visit.  Electronically signed by: Annalee Genta, COMT 04/22/23 12:26 PM  This document serves as a record of services personally performed by Karie Chimera, MD, PhD. It was created on their behalf by Glee Arvin. Manson Passey, OA an ophthalmic technician. The creation of this record is the provider's dictation and/or activities during the visit.    Electronically signed by: Glee Arvin. Manson Passey, OA 04/22/23 12:26 PM   Karie Chimera, M.D., Ph.D. Diseases & Surgery of the  Retina and Vitreous [facility]  I have reviewed the above documentation for accuracy and completeness, and I agree with the above. Karie Chimera, M.D., Ph.D. 04/22/23 12:27 PM   Abbreviations: M myopia (nearsighted); A astigmatism; H hyperopia (farsighted); P presbyopia; Mrx spectacle prescription;  CTL contact lenses; OD right eye; OS left eye; OU both eyes  XT exotropia; ET esotropia; PEK punctate epithelial keratitis; PEE punctate epithelial erosions; DES dry eye syndrome; MGD meibomian gland dysfunction; ATs artificial tears; PFAT's preservative free artificial tears; NSC nuclear sclerotic cataract; PSC posterior subcapsular cataract; ERM epi-retinal membrane; PVD posterior vitreous detachment; RD retinal detachment; DM diabetes mellitus; DR diabetic retinopathy; NPDR non-proliferative diabetic retinopathy; PDR proliferative diabetic retinopathy; CSME clinically significant macular edema; DME diabetic macular edema; dbh dot blot hemorrhages; CWS cotton wool spot; POAG primary open angle glaucoma; C/D cup-to-disc ratio; HVF humphrey visual field; GVF goldmann visual field; OCT optical coherence tomography; IOP intraocular pressure; BRVO Branch retinal vein occlusion; CRVO central retinal vein occlusion; CRAO central retinal artery occlusion; BRAO branch retinal artery occlusion; RT retinal tear; SB scleral buckle; PPV pars plana vitrectomy; VH Vitreous hemorrhage; PRP panretinal laser photocoagulation; IVK intravitreal kenalog; VMT vitreomacular traction; MH Macular hole;  NVD neovascularization of the disc; NVE neovascularization elsewhere; AREDS age related eye disease study; ARMD age related macular degeneration; POAG primary open angle glaucoma; EBMD epithelial/anterior basement membrane dystrophy; ACIOL anterior chamber intraocular lens; IOL intraocular lens; PCIOL posterior chamber intraocular lens; Phaco/IOL phacoemulsification with intraocular lens placement; PRK  photorefractive keratectomy; LASIK laser assisted in situ keratomileusis; HTN hypertension; DM diabetes mellitus; COPD chronic obstructive pulmonary disease

## 2023-04-22 ENCOUNTER — Ambulatory Visit (INDEPENDENT_AMBULATORY_CARE_PROVIDER_SITE_OTHER): Payer: Medicare Other | Admitting: Ophthalmology

## 2023-04-22 ENCOUNTER — Ambulatory Visit (HOSPITAL_COMMUNITY)
Admission: RE | Admit: 2023-04-22 | Discharge: 2023-04-22 | Disposition: A | Discharge status: Home / Self Care | Payer: Medicare Other | Source: Ambulatory Visit | Attending: Family Medicine | Admitting: Family Medicine

## 2023-04-22 ENCOUNTER — Encounter (INDEPENDENT_AMBULATORY_CARE_PROVIDER_SITE_OTHER): Payer: Self-pay | Admitting: Ophthalmology

## 2023-04-22 DIAGNOSIS — M5136 Other intervertebral disc degeneration, lumbar region: Secondary | ICD-10-CM | POA: Diagnosis not present

## 2023-04-22 DIAGNOSIS — H35033 Hypertensive retinopathy, bilateral: Secondary | ICD-10-CM

## 2023-04-22 DIAGNOSIS — M545 Low back pain, unspecified: Secondary | ICD-10-CM | POA: Insufficient documentation

## 2023-04-22 DIAGNOSIS — Z961 Presence of intraocular lens: Secondary | ICD-10-CM | POA: Diagnosis not present

## 2023-04-22 DIAGNOSIS — H3561 Retinal hemorrhage, right eye: Secondary | ICD-10-CM | POA: Diagnosis not present

## 2023-04-22 DIAGNOSIS — I10 Essential (primary) hypertension: Secondary | ICD-10-CM

## 2023-04-22 DIAGNOSIS — M5126 Other intervertebral disc displacement, lumbar region: Secondary | ICD-10-CM | POA: Diagnosis not present

## 2023-04-22 DIAGNOSIS — M4317 Spondylolisthesis, lumbosacral region: Secondary | ICD-10-CM | POA: Diagnosis not present

## 2023-04-22 DIAGNOSIS — M48061 Spinal stenosis, lumbar region without neurogenic claudication: Secondary | ICD-10-CM | POA: Diagnosis not present

## 2023-04-23 DIAGNOSIS — H6120 Impacted cerumen, unspecified ear: Secondary | ICD-10-CM | POA: Diagnosis not present

## 2023-04-23 DIAGNOSIS — E871 Hypo-osmolality and hyponatremia: Secondary | ICD-10-CM | POA: Diagnosis not present

## 2023-04-23 DIAGNOSIS — I10 Essential (primary) hypertension: Secondary | ICD-10-CM | POA: Diagnosis not present

## 2023-04-23 DIAGNOSIS — Z0001 Encounter for general adult medical examination with abnormal findings: Secondary | ICD-10-CM | POA: Diagnosis not present

## 2023-04-23 DIAGNOSIS — E7849 Other hyperlipidemia: Secondary | ICD-10-CM | POA: Diagnosis not present

## 2023-04-23 DIAGNOSIS — E782 Mixed hyperlipidemia: Secondary | ICD-10-CM | POA: Diagnosis not present

## 2023-04-23 DIAGNOSIS — E039 Hypothyroidism, unspecified: Secondary | ICD-10-CM | POA: Diagnosis not present

## 2023-04-23 DIAGNOSIS — K219 Gastro-esophageal reflux disease without esophagitis: Secondary | ICD-10-CM | POA: Diagnosis not present

## 2023-04-23 DIAGNOSIS — Z6823 Body mass index (BMI) 23.0-23.9, adult: Secondary | ICD-10-CM | POA: Diagnosis not present

## 2023-04-24 ENCOUNTER — Ambulatory Visit (HOSPITAL_COMMUNITY): Payer: Medicare Other

## 2023-04-29 ENCOUNTER — Ambulatory Visit (HOSPITAL_COMMUNITY)
Admission: RE | Admit: 2023-04-29 | Discharge: 2023-04-29 | Disposition: A | Discharge status: Home / Self Care | Payer: Medicare Other | Source: Ambulatory Visit | Attending: Family Medicine | Admitting: Family Medicine

## 2023-04-29 ENCOUNTER — Encounter (HOSPITAL_COMMUNITY): Payer: Self-pay

## 2023-04-29 DIAGNOSIS — Z1231 Encounter for screening mammogram for malignant neoplasm of breast: Secondary | ICD-10-CM | POA: Insufficient documentation

## 2023-06-23 ENCOUNTER — Other Ambulatory Visit: Payer: Self-pay

## 2023-06-23 ENCOUNTER — Emergency Department (HOSPITAL_COMMUNITY)
Admission: EM | Admit: 2023-06-23 | Discharge: 2023-06-24 | Disposition: A | Discharge status: Home / Self Care | Payer: Medicare Other | Attending: Emergency Medicine | Admitting: Emergency Medicine

## 2023-06-23 ENCOUNTER — Encounter (HOSPITAL_COMMUNITY): Payer: Self-pay | Admitting: Emergency Medicine

## 2023-06-23 DIAGNOSIS — Z7982 Long term (current) use of aspirin: Secondary | ICD-10-CM | POA: Insufficient documentation

## 2023-06-23 DIAGNOSIS — Z79899 Other long term (current) drug therapy: Secondary | ICD-10-CM | POA: Diagnosis not present

## 2023-06-23 DIAGNOSIS — R42 Dizziness and giddiness: Secondary | ICD-10-CM | POA: Insufficient documentation

## 2023-06-23 DIAGNOSIS — J449 Chronic obstructive pulmonary disease, unspecified: Secondary | ICD-10-CM | POA: Diagnosis not present

## 2023-06-23 DIAGNOSIS — R6889 Other general symptoms and signs: Secondary | ICD-10-CM | POA: Diagnosis not present

## 2023-06-23 DIAGNOSIS — Z743 Need for continuous supervision: Secondary | ICD-10-CM | POA: Diagnosis not present

## 2023-06-23 DIAGNOSIS — R531 Weakness: Secondary | ICD-10-CM | POA: Diagnosis not present

## 2023-06-23 DIAGNOSIS — E039 Hypothyroidism, unspecified: Secondary | ICD-10-CM | POA: Insufficient documentation

## 2023-06-23 DIAGNOSIS — I10 Essential (primary) hypertension: Secondary | ICD-10-CM | POA: Diagnosis not present

## 2023-06-23 LAB — CBC
HCT: 39.2 % (ref 36.0–46.0)
Hemoglobin: 12.7 g/dL (ref 12.0–15.0)
MCH: 32.2 pg (ref 26.0–34.0)
MCHC: 32.4 g/dL (ref 30.0–36.0)
MCV: 99.5 fL (ref 80.0–100.0)
Platelets: 198 10*3/uL (ref 150–400)
RBC: 3.94 MIL/uL (ref 3.87–5.11)
RDW: 13.1 % (ref 11.5–15.5)
WBC: 6.5 10*3/uL (ref 4.0–10.5)
nRBC: 0 % (ref 0.0–0.2)

## 2023-06-23 LAB — CBG MONITORING, ED: Glucose-Capillary: 166 mg/dL — ABNORMAL HIGH (ref 70–99)

## 2023-06-23 LAB — URINALYSIS, ROUTINE W REFLEX MICROSCOPIC
Bilirubin Urine: NEGATIVE
Glucose, UA: 150 mg/dL — AB
Hgb urine dipstick: NEGATIVE
Ketones, ur: NEGATIVE mg/dL
Leukocytes,Ua: NEGATIVE
Nitrite: NEGATIVE
Protein, ur: NEGATIVE mg/dL
Specific Gravity, Urine: 1.006 (ref 1.005–1.030)
pH: 8 (ref 5.0–8.0)

## 2023-06-23 LAB — BASIC METABOLIC PANEL
Anion gap: 8 (ref 5–15)
BUN: 21 mg/dL (ref 8–23)
CO2: 28 mmol/L (ref 22–32)
Calcium: 8.9 mg/dL (ref 8.9–10.3)
Chloride: 100 mmol/L (ref 98–111)
Creatinine, Ser: 0.72 mg/dL (ref 0.44–1.00)
GFR, Estimated: >60 mL/min (ref >60)
Glucose, Bld: 161 mg/dL — ABNORMAL HIGH (ref 70–99)
Potassium: 3.6 mmol/L (ref 3.5–5.1)
Sodium: 136 mmol/L (ref 135–145)

## 2023-06-23 LAB — TROPONIN I (HIGH SENSITIVITY): Troponin I (High Sensitivity): 8 ng/L (ref <18)

## 2023-06-23 MED ORDER — SODIUM CHLORIDE 0.9 % IV BOLUS
500.0000 mL | Freq: Once | INTRAVENOUS | Status: AC
  Administered 2023-06-23: 500 mL via INTRAVENOUS

## 2023-06-23 MED ORDER — MECLIZINE HCL 12.5 MG PO TABS: 12.5000 mg | ORAL_TABLET | Freq: Two times a day (BID) | ORAL | 0 refills | Status: AC | PRN

## 2023-06-23 NOTE — ED Provider Notes (Signed)
[place] [facility] AT [facility] Provider Note  CSN: [id number] Arrival date & time: 06/23/23 2059  Chief Complaint(s) Weakness, Dizziness, and Nausea  HPI Kaitlyn Sanchez is a 88 y.o. female with past medical history as below, significant for hypertension, GERD, HLD, hyponatremia, COPD, vertigo who presents to the ED with complaint of dizziness, fatigue, generalized weakness  Patient reports sensation of dizziness, fatigue, felt her sodium was low around 4 to 5 PM this evening.  Patient reports very similar symptoms in the past associated with low sodium.  She took 2 salt tablets, potassium tablet and Dramamine and her symptoms have improved significantly.  She denies any unilateral weakness, no difficulty with her speech, no slurred speech or word finding difficulty.  No chest pain, palpitations, difficulty breathing, no abdominal pain nausea or vomiting.  No recent diet or medication changes  Past Medical History Past Medical History:  Diagnosis Date   Essential hypertension    GERD (gastroesophageal reflux disease)    History of cardiac catheterization 2013   Mild coronary atherosclerosis - [facility]   Hyperlipidemia    Hypertensive retinopathy    Hypothyroidism    Patient Active Problem List   Diagnosis Date Noted   Hypothyroidism following radioiodine therapy 05/11/2020   Hyponatremia--very symptomatic with seizures 03/17/2020   Essential hypertension    GERD (gastroesophageal reflux disease)    Hypothyroidism    Hyperlipidemia    Hypocalcemia    Seizure--in the setting of low sodium and Levaquin use    Constipation 08/19/2015   Abdominal pain 08/19/2015   Mucosal abnormality of esophagus    Dyspepsia 10/07/2014   Venous hum 11/23/2012   COPD (chronic obstructive pulmonary disease) (HCC) 10/30/2012   Dizziness 10/30/2012   Hypertensive emergency 06/07/2012   H N P-LUMBAR 09/01/2008   Displacement of lumbar intervertebral disc 09/01/2008   LOW BACK  PAIN 01/28/2008   SCIATICA 01/28/2008   Home Medication(s) Prior to Admission medications   Medication Sig Start Date End Date Taking? Authorizing Provider  meclizine (ANTIVERT) 12.5 MG tablet Take 1 tablet (12.5 mg total) by mouth 2 (two) times daily as needed for dizziness. 06/23/23  Yes Tanda Rockers A, DO  amLODipine (NORVASC) 10 MG tablet Take 10 mg by mouth every evening.  03/14/20   [provider]  aspirin EC 81 MG tablet Take 243 mg by mouth at bedtime.    [provider]  Calcium Carbonate-Vit D-Min (CALTRATE 600+D PLUS PO) Take 1 tablet by mouth daily.    [provider]  Chelated Potassium 99 MG TABS Take 1 tablet by mouth daily.    [provider]  Cholecalciferol (VITAMIN D3) 125 MCG (5000 UT) CAPS Take 1 capsule by mouth daily.    [provider]  docusate sodium (COLACE) 100 MG capsule Take 300 mg by mouth at bedtime.     [provider]  Flaxseed, Linseed, (FLAXSEED OIL) 1000 MG CAPS Take 1,000 mg by mouth daily.     [provider]  glucosamine-chondroitin 500-400 MG tablet Take 1 tablet by mouth daily.    [provider]  labetalol (NORMODYNE) 100 MG tablet Take 1 tablet by mouth 3 (three) times daily. 02/22/20   [provider]  levothyroxine (SYNTHROID) 100 MCG tablet TAKE 1 TABLET BY MOUTH ONCE A DAY. Patient taking differently: Take 110 mcg by mouth daily. 09/11/22   Roma Kayser, MD  LORazepam (ATIVAN) 1 MG tablet Take 0.5 mg by mouth 2 (two) times daily.  [provider]  multivitamin-iron-minerals-folic acid (CENTRUM) chewable tablet Chew 1 tablet by mouth daily.    [provider]  olmesartan (BENICAR) 40 MG tablet Take 40 mg by mouth daily. 02/08/20   [provider]  Omega 3 1200 MG CAPS Take 1,200 mg by mouth daily.     [provider]  vitamin B-12 (CYANOCOBALAMIN) 1000 MCG tablet Take 1,000 mcg by mouth daily.    [provider]   vitamin C (ASCORBIC ACID) 500 MG tablet Take 500 mg by mouth daily.    [provider]  zinc gluconate 50 MG tablet Take 50 mg by mouth daily.    [provider]                                                                                                                                    Past Surgical History Past Surgical History:  Procedure Laterality Date   APPENDECTOMY     BREAST CYST ASPIRATION Right    unsure when   CARPAL TUNNEL RELEASE     CATARACT EXTRACTION W/PHACO Left 12/28/2013   Procedure: CATARACT EXTRACTION PHACO AND INTRAOCULAR LENS PLACEMENT (IOC);  Surgeon: Gemma Payor, MD;  Location: [facility];  Service: Ophthalmology;  Laterality: Left;  CDE 18.64   CATARACT EXTRACTION W/PHACO Right 01/07/2014   Procedure: CATARACT EXTRACTION PHACO AND INTRAOCULAR LENS PLACEMENT (IOC);  Surgeon: Gemma Payor, MD;  Location: [facility];  Service: Ophthalmology;  Laterality: Right;  CDE:11.01   COLONOSCOPY     ESOPHAGOGASTRODUODENOSCOPY N/A 10/11/2014   RMR:non critical schatzkis ring/HH   EYE SURGERY     LUMBAR LAMINECTOMY     X 2   TONSILLECTOMY     Family History Family History  Problem Relation Age of Onset   Colon cancer Other        no first degree relatives   Renal cancer Father    Heart attack Father    Glaucoma Mother    Heart failure Mother     Social History Social History   Tobacco Use   Smoking status: Never    Passive exposure: Never   Smokeless tobacco: Never  Vaping Use   Vaping status: Never Used  Substance Use Topics   Alcohol use: No   Drug use: No   Allergies Levaquin [levofloxacin]  Review of Systems Review of Systems  Constitutional:  Positive for fatigue.  HENT:  Negative for trouble swallowing.   Eyes:  Negative for visual disturbance.  Respiratory:  Negative for chest tightness, shortness of breath and wheezing.   Cardiovascular:  Negative for chest pain and palpitations.  Gastrointestinal:  Negative for abdominal pain,  nausea and vomiting.  Genitourinary:  Negative for difficulty urinating and dysuria.  Skin:  Negative for wound.  Neurological:  Positive for dizziness and light-headedness. Negative for tremors, seizures, syncope, speech difficulty, numbness and headaches.  All other systems reviewed and are negative.   Physical Exam  Vital Signs  I have reviewed the triage vital signs BP (!) 122/107   Pulse (!) 56   Temp 98.2 F (36.8 C) (Oral)   Resp 15   Ht 5\' 2"  (1.575 m)   Wt 60.4 kg   SpO2 93%   BMI 24.35 kg/m  Physical Exam Vitals and nursing note reviewed.  Constitutional:      General: She is not in acute distress.    Appearance: Normal appearance.  HENT:     Head: Normocephalic and atraumatic.     Right Ear: External ear normal.     Left Ear: External ear normal.     Nose: Nose normal.     Mouth/Throat:     Mouth: Mucous membranes are moist.  Eyes:     General: No scleral icterus.       Right eye: No discharge.        Left eye: No discharge.     Extraocular Movements: Extraocular movements intact.     Pupils: Pupils are equal, round, and reactive to light.  Cardiovascular:     Rate and Rhythm: Normal rate and regular rhythm.     Pulses: Normal pulses.     Heart sounds: Normal heart sounds.  Pulmonary:     Effort: Pulmonary effort is normal. No respiratory distress.     Breath sounds: Normal breath sounds. No stridor.  Abdominal:     General: Abdomen is flat. There is no distension.     Palpations: Abdomen is soft.     Tenderness: There is no abdominal tenderness.  Musculoskeletal:     Cervical back: Normal range of motion. No rigidity.     Right lower leg: No edema.     Left lower leg: No edema.  Skin:    General: Skin is warm and dry.     Capillary Refill: Capillary refill takes less than 2 seconds.  Neurological:     Mental Status: She is alert and oriented to person, place, and time.     GCS: GCS eye subscore is 4. GCS verbal subscore is 5. GCS motor subscore is  6.     Cranial Nerves: Cranial nerves 2-12 are intact. No dysarthria or facial asymmetry.     Sensory: Sensation is intact.     Motor: Motor function is intact. No tremor or pronator drift.     Coordination: Coordination is intact. Finger-Nose-Finger Test normal.     Gait: Gait is intact.     Comments: Strength 5/5 bilateral upper and lower extremities  Psychiatric:        Mood and Affect: Mood normal.        Behavior: Behavior normal. Behavior is cooperative.     ED Results and Treatments Labs (all labs ordered are listed, but only abnormal results are displayed) Labs Reviewed  BASIC METABOLIC PANEL - Abnormal; Notable for the following components:      Result Value   Glucose, Bld 161 (*)    All other components within normal limits  URINALYSIS, ROUTINE W REFLEX MICROSCOPIC - Abnormal; Notable for the following components:   Color, Urine COLORLESS (*)    Glucose, UA 150 (*)    All other components within normal limits  CBG MONITORING, ED - Abnormal; Notable for the following components:   Glucose-Capillary 166 (*)    All other components within normal limits  CBC  TROPONIN I (HIGH SENSITIVITY)  TROPONIN I (HIGH SENSITIVITY)  Radiology No results found.  Pertinent labs & imaging results that were available during my care of the patient were reviewed by me and considered in my medical decision making (see MDM for details).  Medications Ordered in ED Medications  sodium chloride 0.9 % bolus 500 mL (0 mLs Intravenous Stopped 06/23/23 2311)                                                                                                                                     Procedures Procedures  (including critical care time)  Medical Decision Making / ED Course    Medical Decision Making:    KATHELENE MONTESANO is a 88 y.o. female with past medical history  as below, significant for hypertension, GERD, HLD, hyponatremia, COPD, vertigo who presents to the ED with complaint of dizziness, fatigue, generalized weakness. The complaint involves an extensive differential diagnosis and also carries with it a high risk of complications and morbidity.  Serious etiology was considered. Ddx includes but is not limited to: Vertigo peripheral versus central, metabolic derangement, medication effect, dehydration, ACS, etc.  Complete initial physical exam performed, notably the patient  was no acute distress, neurologic exam is nonfocal, she has no chest pain or dyspnea.  Symptoms greatly improved since the onset.    Reviewed and confirmed nursing documentation for past medical history, family history, social history.  Vital signs reviewed.    Clinical Course as of 06/23/23 2337  Wynelle Link Jun 23, 2023  2322 Troponin I (High Sensitivity): 8 Onset of pain >4 hours PTA, no chest pain; EKG non-ischemic, rpt trop not needed  [SG]    Clinical Course User Index [SG] Sloan Leiter, DO   Unable to formally interpret EKG 2/2 error in MUSE, EKG today 06/23/2023 21:16 with NSR, similar to prior tracing, no ischemia changes, no STEMI, intervals stable.   Labs stable, she is feeling better after interventions taken at home.  Neuroexam is nonfocal.  Gait steady.  Tolerating p.o. intake. Feeling greatly improved overall. Family will stay with her tonight  Patient presents with apparent peripheral vertigo. On initial evaluation patient appears in no acute distress, afebrile with normal vital signs. Vertigo most suggestive of peripheral cause. Neuro intact without sign of CNS ischemia or other serious etiology. DC on Meclizine with close PCP F/U. Warnings discussed.   Patient in no distress and overall condition improved here in the ED. Detailed discussions were had with the patient regarding current findings, and need for close f/u with PCP or on call doctor. The patient has been  instructed to return immediately if the symptoms worsen in any way for re-evaluation. Patient verbalized understanding and is in agreement with current care plan. All questions answered prior to discharge.              Additional history obtained: -Additional history obtained from family -External records from outside source obtained and reviewed including: Chart review including  previous notes, labs, imaging, consultation notes including  Primary care documentation, prior labs and imaging, home medications   Lab Tests: -I ordered, reviewed, and interpreted labs.   The pertinent results include:   Labs Reviewed  BASIC METABOLIC PANEL - Abnormal; Notable for the following components:      Result Value   Glucose, Bld 161 (*)    All other components within normal limits  URINALYSIS, ROUTINE W REFLEX MICROSCOPIC - Abnormal; Notable for the following components:   Color, Urine COLORLESS (*)    Glucose, UA 150 (*)    All other components within normal limits  CBG MONITORING, ED - Abnormal; Notable for the following components:   Glucose-Capillary 166 (*)    All other components within normal limits  CBC  TROPONIN I (HIGH SENSITIVITY)  TROPONIN I (HIGH SENSITIVITY)    Notable for labs stable  EKG   EKG Interpretation Date/Time:    Ventricular Rate:    PR Interval:    QRS Duration:    QT Interval:    QTC Calculation:   R Axis:      Text Interpretation:           Imaging Studies ordered: na   Medicines ordered and prescription drug management: Meds ordered this encounter  Medications   sodium chloride 0.9 % bolus 500 mL   meclizine (ANTIVERT) 12.5 MG tablet    Sig: Take 1 tablet (12.5 mg total) by mouth 2 (two) times daily as needed for dizziness.    Dispense:  10 tablet    Refill:  0    -I have reviewed the patients home medicines and have made adjustments as needed   Consultations Obtained: na   Cardiac Monitoring: The patient was maintained on  a cardiac monitor.  I personally viewed and interpreted the cardiac monitored which showed an underlying rhythm of: NSR Continuous pulse oximetry interpreted by myself, 94% on RA.    Social Determinants of Health:  Diagnosis or treatment significantly limited by social determinants of health: na   Reevaluation: After the interventions noted above, I reevaluated the patient and found that they have resolved  Co morbidities that complicate the patient evaluation  Past Medical History:  Diagnosis Date   Essential hypertension    GERD (gastroesophageal reflux disease)    History of cardiac catheterization 2013   Mild coronary atherosclerosis - [facility]   Hyperlipidemia    Hypertensive retinopathy    Hypothyroidism       Dispostion: Disposition decision including need for hospitalization was considered, and patient discharged from emergency department.    Final Clinical Impression(s) / ED Diagnoses Final diagnoses:  Vertigo  Generalized weakness        Sloan Leiter, DO 06/23/23 2337

## 2023-06-23 NOTE — ED Triage Notes (Addendum)
Pt bib EMS after she had a sudden onset of dizziness, weakness, and nausea. States she thinks her Potassium and Sodium are low as she had "had this happen before". Per EMS, pt took Sodium Tabs PTA. EMS reports CBG of 180.

## 2023-06-23 NOTE — Discharge Instructions (Addendum)
Be sure to drink plenty of fluids over the next few days and get plenty of rest. I sent you a prescription for antivert/meclizine which is similar to dramamine but may work somewhat better for your dizziness/vertigo  It was a pleasure caring for you today in the emergency department.  Please return to the emergency department for any worsening or worrisome symptoms.

## 2023-06-24 LAB — TROPONIN I (HIGH SENSITIVITY): Troponin I (High Sensitivity): 10 ng/L (ref <18)

## 2023-07-16 DIAGNOSIS — Z23 Encounter for immunization: Secondary | ICD-10-CM | POA: Diagnosis not present

## 2023-07-16 DIAGNOSIS — E871 Hypo-osmolality and hyponatremia: Secondary | ICD-10-CM | POA: Diagnosis not present

## 2023-07-16 DIAGNOSIS — Z6824 Body mass index (BMI) 24.0-24.9, adult: Secondary | ICD-10-CM | POA: Diagnosis not present

## 2023-07-22 ENCOUNTER — Encounter (INDEPENDENT_AMBULATORY_CARE_PROVIDER_SITE_OTHER): Payer: Medicare Other | Admitting: Ophthalmology

## 2023-07-22 DIAGNOSIS — H3561 Retinal hemorrhage, right eye: Secondary | ICD-10-CM

## 2023-07-22 DIAGNOSIS — I10 Essential (primary) hypertension: Secondary | ICD-10-CM

## 2023-07-22 DIAGNOSIS — H35033 Hypertensive retinopathy, bilateral: Secondary | ICD-10-CM

## 2023-07-22 DIAGNOSIS — Z961 Presence of intraocular lens: Secondary | ICD-10-CM

## 2023-07-22 NOTE — Progress Notes (Signed)
[facility] - Clinic Note  07/30/2023     CHIEF COMPLAINT Patient presents for Retina Follow Up  HISTORY OF PRESENT ILLNESS: Kaitlyn Sanchez is a 88 y.o. female who presents to the clinic today for:   HPI     Retina Follow Up   In both eyes.  This started 3 months ago.  Duration of 3 months.  Since onset it is stable.  I, the attending physician,  performed the HPI with the patient and updated documentation appropriately.        Comments   3 month retina follow up HTN ret ou pt is reporting no vision changes noticed she has floaters that are without changes she denies any flashes of light pt has vertigo for the past 2 weeks       Last edited by Rennis Chris, MD on 07/31/2023  8:48 PM.    Pt states vision is stable, no new concerns, she has had vertigo for the past 2 weeks  Referring physician: Assunta Found, MD [address] [place],  [place] [address]  HISTORICAL INFORMATION:   Selected notes from the MEDICAL RECORD NUMBER Referred by Dr. Daisy Lazar for concern of diabetic retinopathy LEE: 03.11.20 Judie Petit. Cotter) [BCVA: OD: 20/20- OS: 20/20-] Ocular Hx-pseudo OU (Dr. Gemma Payor, 2015), HTN ret, vitreous degeneration PMH-HTN, HLD, hypothyroidism    CURRENT MEDICATIONS: No current outpatient medications on file. (Ophthalmic Drugs)   No current facility-administered medications for this visit. (Ophthalmic Drugs)   Current Outpatient Medications (Other)  Medication Sig   amLODipine (NORVASC) 10 MG tablet Take 10 mg by mouth every evening.    aspirin EC 81 MG tablet Take 243 mg by mouth at bedtime.   Calcium Carbonate-Vit D-Min (CALTRATE 600+D PLUS PO) Take 1 tablet by mouth daily.   Chelated Potassium 99 MG TABS Take 1 tablet by mouth daily.   Cholecalciferol (VITAMIN D3) 125 MCG (5000 UT) CAPS Take 1 capsule by mouth daily.   docusate sodium (COLACE) 100 MG capsule Take 300 mg by mouth at bedtime.    Flaxseed, Linseed, (FLAXSEED OIL) 1000 MG  CAPS Take 1,000 mg by mouth daily.    glucosamine-chondroitin 500-400 MG tablet Take 1 tablet by mouth daily.   labetalol (NORMODYNE) 100 MG tablet Take 1 tablet by mouth 3 (three) times daily.   levothyroxine (SYNTHROID) 100 MCG tablet TAKE 1 TABLET BY MOUTH ONCE A DAY. (Patient taking differently: Take 110 mcg by mouth daily.)   LORazepam (ATIVAN) 1 MG tablet Take 0.5 mg by mouth 2 (two) times daily.   meclizine (ANTIVERT) 12.5 MG tablet Take 1 tablet (12.5 mg total) by mouth 2 (two) times daily as needed for dizziness.   multivitamin-iron-minerals-folic acid (CENTRUM) chewable tablet Chew 1 tablet by mouth daily.   olmesartan (BENICAR) 40 MG tablet Take 40 mg by mouth daily.   Omega 3 1200 MG CAPS Take 1,200 mg by mouth daily.    vitamin B-12 (CYANOCOBALAMIN) 1000 MCG tablet Take 1,000 mcg by mouth daily.   vitamin C (ASCORBIC ACID) 500 MG tablet Take 500 mg by mouth daily.   zinc gluconate 50 MG tablet Take 50 mg by mouth daily.   No current facility-administered medications for this visit. (Other)   REVIEW OF SYSTEMS: ROS   Positive for: Gastrointestinal, Eyes, Respiratory Negative for: Constitutional, Neurological, Skin, Genitourinary, Musculoskeletal, HENT, Endocrine, Cardiovascular, Psychiatric, Allergic/Imm, Heme/Lymph Last edited by Etheleen Mayhew, COT on 07/30/2023 10:09 AM.     ALLERGIES Allergies  Allergen Reactions  Levaquin [Levofloxacin] Other (See Comments)    Seizures   PAST MEDICAL HISTORY Past Medical History:  Diagnosis Date   Essential hypertension    GERD (gastroesophageal reflux disease)    History of cardiac catheterization 2013   Mild coronary atherosclerosis - [facility]   Hyperlipidemia    Hypertensive retinopathy    Hypothyroidism    Past Surgical History:  Procedure Laterality Date   APPENDECTOMY     BREAST CYST ASPIRATION Right    unsure when   CARPAL TUNNEL RELEASE     CATARACT EXTRACTION W/PHACO Left 12/28/2013   Procedure: CATARACT  EXTRACTION PHACO AND INTRAOCULAR LENS PLACEMENT (IOC);  Surgeon: Gemma Payor, MD;  Location: [facility];  Service: Ophthalmology;  Laterality: Left;  CDE 18.64   CATARACT EXTRACTION W/PHACO Right 01/07/2014   Procedure: CATARACT EXTRACTION PHACO AND INTRAOCULAR LENS PLACEMENT (IOC);  Surgeon: Gemma Payor, MD;  Location: [facility];  Service: Ophthalmology;  Laterality: Right;  CDE:11.01   COLONOSCOPY     ESOPHAGOGASTRODUODENOSCOPY N/A 10/11/2014   RMR:non critical schatzkis ring/HH   EYE SURGERY     LUMBAR LAMINECTOMY     X 2   TONSILLECTOMY     FAMILY HISTORY Family History  Problem Relation Age of Onset   Colon cancer Other        no first degree relatives   Renal cancer Father    Heart attack Father    Glaucoma Mother    Heart failure Mother    SOCIAL HISTORY Social History   Tobacco Use   Smoking status: Never    Passive exposure: Never   Smokeless tobacco: Never  Vaping Use   Vaping status: Never Used  Substance Use Topics   Alcohol use: No   Drug use: No       OPHTHALMIC EXAM: Base Eye Exam     Visual Acuity (Snellen - Linear)       Right Left   Dist [place] 20/20 20/20 -1         Tonometry (Tonopen, 10:12 AM)       Right Left   Pressure 13 15         Pupils       Pupils Dark Light Shape React APD   Right PERRL 3 2 Round Brisk None   Left PERRL 3 2 Round Brisk None         Visual Fields       Left Right    Full Full         Extraocular Movement       Right Left    Full, Ortho Full, Ortho         Neuro/Psych     Oriented x3: Yes   Mood/Affect: Normal         Dilation     Both eyes: 2.5% Phenylephrine @ 10:10 AM           Slit Lamp and Fundus Exam     Slit Lamp Exam       Right Left   Lids/Lashes Dermatochalasis - upper lid, Ptosis Dermatochalasis - upper lid, Ptosis   Conjunctiva/Sclera mild temporal Pinguecula mild nasal and Temporal Pinguecula   Cornea Arcus, Well healed cataract wounds, tear film debris, trace PEE Arcus,  Well healed cataract wounds, trace PEE, trace tear film debris   Anterior Chamber deep and clear deep and clear   Iris Round and dilated Round and dilated   Lens PC IOL in good position with open PC PC IOL in good  position with open PC   Anterior Vitreous syneresis, Posterior vitreous detachment Vitreous syneresis, Posterior vitreous detachment, vitreous condensations inferiorly         Fundus Exam       Right Left   Disc Pink and Sharp Pink and Sharp   C/D Ratio 0.4 0.4   Macula Blunted foveal, focal cluster of MA and exudate temporal fovea with interval improvement in edema / cystic changes Flat, good foveal reflex, mild RPE mottling, trace Epiretinal membrane, rare MA, No edema   Vessels attenuated, mild tortuosity attenuated, mild tortuosity   Periphery Attached, No heme Attached, no heme           Refraction     Wearing Rx       Sphere Cylinder Add   Right -0.25 Sphere +2.25   Left -0.25 Sphere +2.25    Type: PAL           IMAGING AND PROCEDURES  Imaging and Procedures for @TODAY @  OCT, Retina - OU - Both Eyes       Right Eye Quality was good. Central Foveal Thickness: 293. Progression has improved. Findings include no SRF, abnormal foveal contour, intraretinal hyper-reflective material, intraretinal fluid (persistent IRF / cystic changes and focal IRHM IT fovea and mac -- slightly improved).   Left Eye Quality was good. Central Foveal Thickness: 261. Progression has been stable. Findings include normal foveal contour, no IRF, no SRF.   Notes *Images captured and stored on drive  Diagnosis / Impression:  OD: persistent IRF / cystic changes and focal IRHM IT fovea and mac -- slightly improved OS: NFP, no IRF/SRF   Clinical management:  See below  Abbreviations: NFP - Normal foveal profile. CME - cystoid macular edema. PED - pigment epithelial detachment. IRF - intraretinal fluid. SRF - subretinal fluid. EZ - ellipsoid zone. ERM - epiretinal membrane.  ORA - outer retinal atrophy. ORT - outer retinal tubulation. SRHM - subretinal hyper-reflective material            ASSESSMENT/PLAN:    ICD-10-CM   1. Hypertensive retinopathy of both eyes  H35.033 OCT, Retina - OU - Both Eyes    2. Essential hypertension  I10     3. Retinal hemorrhage of right eye  H35.61     4. Pseudophakia of both eyes  Z96.1      1-3. Hypertensive retinopathy OU  - persistent focal intraretinal hemes in macula OD -- slightly increased today  - OCT shows persistent IRF / cystic changes and focal IRHM IT fovea and mac -- slightly improved -- ?mild old BRVO -- 3 mos since last exam  - BCVA remains 20/20 OU  - FA (05.05.21) shows focal MA in macula with late leakage OU    - BP  03.11.24 176/74 10.18.23 162/74 04.19.23 185/71  03.05.2021  167/72 R arm and 147/67 L arm  11.06.2020  147/67  07.06.2020 169/73  - meds adjusted by PCP and cardiology -- BP not checked by patient   - discussed importance of tight BP control  - monitor for now  - discussed potential for injections / focal laser OD if vision decreases  - f/u 4 months, sooner prn, DFE/OCT, possible injection  4. Pseudophakia OU  - s/p CE/IOL OU - Dr. Gemma Payor (2015)  - beautiful surgeries, doing well  - continue to monitor  Ophthalmic Meds Ordered this visit:  No orders of the defined types were placed in this encounter.    Return in about 4 months (  around 11/30/2023) for f/u HTN ret OU, DFE, OCT.  There are no Patient Instructions on file for this visit.   This document serves as a record of services personally performed by Karie Chimera, MD, PhD. It was created on their behalf by Charlette Caffey, COT an ophthalmic technician. The creation of this record is the provider's dictation and/or activities during the visit.    Electronically signed by:  Charlette Caffey, COT  07/31/23 8:49 PM  Karie Chimera, M.D., Ph.D. Diseases & Surgery of the Retina and Vitreous [facility]  I have reviewed the above documentation for accuracy and completeness, and I agree with the above. Karie Chimera, M.D., Ph.D. 07/31/23 8:51 PM  Abbreviations: M myopia (nearsighted); A astigmatism; H hyperopia (farsighted); P presbyopia; Mrx spectacle prescription;  CTL contact lenses; OD right eye; OS left eye; OU both eyes  XT exotropia; ET esotropia; PEK punctate epithelial keratitis; PEE punctate epithelial erosions; DES dry eye syndrome; MGD meibomian gland dysfunction; ATs artificial tears; PFAT's preservative free artificial tears; NSC nuclear sclerotic cataract; PSC posterior subcapsular cataract; ERM epi-retinal membrane; PVD posterior vitreous detachment; RD retinal detachment; DM diabetes mellitus; DR diabetic retinopathy; NPDR non-proliferative diabetic retinopathy; PDR proliferative diabetic retinopathy; CSME clinically significant macular edema; DME diabetic macular edema; dbh dot blot hemorrhages; CWS cotton wool spot; POAG primary open angle glaucoma; C/D cup-to-disc ratio; HVF humphrey visual field; GVF goldmann visual field; OCT optical coherence tomography; IOP intraocular pressure; BRVO Branch retinal vein occlusion; CRVO central retinal vein occlusion; CRAO central retinal artery occlusion; BRAO branch retinal artery occlusion; RT retinal tear; SB scleral buckle; PPV pars plana vitrectomy; VH Vitreous hemorrhage; PRP panretinal laser photocoagulation; IVK intravitreal kenalog; VMT vitreomacular traction; MH Macular hole;  NVD neovascularization of the disc; NVE neovascularization elsewhere; AREDS age related eye disease study; ARMD age related macular degeneration; POAG primary open angle glaucoma; EBMD epithelial/anterior basement membrane dystrophy; ACIOL anterior chamber intraocular lens; IOL intraocular lens; PCIOL posterior chamber intraocular lens; Phaco/IOL phacoemulsification with intraocular lens placement; PRK photorefractive keratectomy; LASIK laser  assisted in situ keratomileusis; HTN hypertension; DM diabetes mellitus; COPD chronic obstructive pulmonary disease

## 2023-07-30 ENCOUNTER — Ambulatory Visit (INDEPENDENT_AMBULATORY_CARE_PROVIDER_SITE_OTHER): Payer: Medicare Other | Admitting: Ophthalmology

## 2023-07-30 ENCOUNTER — Encounter (INDEPENDENT_AMBULATORY_CARE_PROVIDER_SITE_OTHER): Payer: Self-pay | Admitting: Ophthalmology

## 2023-07-30 DIAGNOSIS — I10 Essential (primary) hypertension: Secondary | ICD-10-CM | POA: Diagnosis not present

## 2023-07-30 DIAGNOSIS — H35033 Hypertensive retinopathy, bilateral: Secondary | ICD-10-CM | POA: Diagnosis not present

## 2023-07-30 DIAGNOSIS — Z961 Presence of intraocular lens: Secondary | ICD-10-CM | POA: Diagnosis not present

## 2023-07-30 DIAGNOSIS — H3561 Retinal hemorrhage, right eye: Secondary | ICD-10-CM | POA: Diagnosis not present

## 2023-07-31 ENCOUNTER — Encounter (INDEPENDENT_AMBULATORY_CARE_PROVIDER_SITE_OTHER): Payer: Self-pay | Admitting: Ophthalmology

## 2023-10-15 ENCOUNTER — Other Ambulatory Visit (HOSPITAL_COMMUNITY): Payer: Self-pay | Admitting: Family Medicine

## 2023-10-15 DIAGNOSIS — Z6823 Body mass index (BMI) 23.0-23.9, adult: Secondary | ICD-10-CM | POA: Diagnosis not present

## 2023-10-15 DIAGNOSIS — R1012 Left upper quadrant pain: Secondary | ICD-10-CM | POA: Diagnosis not present

## 2023-10-24 ENCOUNTER — Ambulatory Visit (HOSPITAL_COMMUNITY)
Admission: RE | Admit: 2023-10-24 | Discharge: 2023-10-24 | Disposition: A | Discharge status: Home / Self Care | Payer: Medicare Other | Source: Ambulatory Visit | Attending: Family Medicine | Admitting: Family Medicine

## 2023-10-24 DIAGNOSIS — R1012 Left upper quadrant pain: Secondary | ICD-10-CM | POA: Diagnosis not present

## 2023-10-24 DIAGNOSIS — R1032 Left lower quadrant pain: Secondary | ICD-10-CM | POA: Diagnosis not present

## 2023-11-27 ENCOUNTER — Encounter (INDEPENDENT_AMBULATORY_CARE_PROVIDER_SITE_OTHER): Payer: Medicare Other | Admitting: Ophthalmology

## 2023-12-03 ENCOUNTER — Encounter (INDEPENDENT_AMBULATORY_CARE_PROVIDER_SITE_OTHER): Payer: Self-pay | Admitting: Ophthalmology

## 2023-12-03 ENCOUNTER — Ambulatory Visit (INDEPENDENT_AMBULATORY_CARE_PROVIDER_SITE_OTHER): Payer: Medicare Other | Admitting: Ophthalmology

## 2023-12-03 VITALS — BP 148/83

## 2023-12-03 DIAGNOSIS — Z961 Presence of intraocular lens: Secondary | ICD-10-CM | POA: Diagnosis not present

## 2023-12-03 DIAGNOSIS — I10 Essential (primary) hypertension: Secondary | ICD-10-CM | POA: Diagnosis not present

## 2023-12-03 DIAGNOSIS — H35033 Hypertensive retinopathy, bilateral: Secondary | ICD-10-CM

## 2023-12-03 DIAGNOSIS — H3561 Retinal hemorrhage, right eye: Secondary | ICD-10-CM

## 2023-12-03 NOTE — Progress Notes (Signed)
 [facility] - Clinic Note  12/03/2023     CHIEF COMPLAINT Patient presents for Retina Follow Up  HISTORY OF PRESENT ILLNESS: Kaitlyn Sanchez is a 89 y.o. female who presents to the clinic today for:   HPI     Retina Follow Up   In both eyes.  This started 4 months ago.  Duration of 4 months.  Since onset it is stable.  I, the attending physician,  performed the HPI with the patient and updated documentation appropriately.        Comments   4 month retina follow up hypertensive retinopathy pt is reporting no vision changes noticed pt denies any flashes has some floaters at times pt is not currently checking her BP       Last edited by Rennis Chris, MD on 12/03/2023  1:16 PM.      Referring physician: Assunta Found, MD [address] [place],  [place] [address]  HISTORICAL INFORMATION:   Selected notes from the MEDICAL RECORD NUMBER Referred by Dr. Daisy Lazar for concern of diabetic retinopathy LEE: 03.11.20 (M. Cotter) [BCVA: OD: 20/20- OS: 20/20-] Ocular Hx-pseudo OU (Dr. Gemma Payor, 2015), HTN ret, vitreous degeneration PMH-HTN, HLD, hypothyroidism    CURRENT MEDICATIONS: No current outpatient medications on file. (Ophthalmic Drugs)   No current facility-administered medications for this visit. (Ophthalmic Drugs)   Current Outpatient Medications (Other)  Medication Sig   amLODipine (NORVASC) 10 MG tablet Take 10 mg by mouth every evening.    aspirin EC 81 MG tablet Take 243 mg by mouth at bedtime.   Calcium Carbonate-Vit D-Min (CALTRATE 600+D PLUS PO) Take 1 tablet by mouth daily.   Chelated Potassium 99 MG TABS Take 1 tablet by mouth daily.   Cholecalciferol (VITAMIN D3) 125 MCG (5000 UT) CAPS Take 1 capsule by mouth daily.   docusate sodium (COLACE) 100 MG capsule Take 300 mg by mouth at bedtime.    Flaxseed, Linseed, (FLAXSEED OIL) 1000 MG CAPS Take 1,000 mg by mouth daily.    glucosamine-chondroitin 500-400 MG tablet Take 1 tablet by  mouth daily.   labetalol (NORMODYNE) 100 MG tablet Take 1 tablet by mouth 3 (three) times daily.   levothyroxine (SYNTHROID) 100 MCG tablet TAKE 1 TABLET BY MOUTH ONCE A DAY. (Patient taking differently: Take 110 mcg by mouth daily.)   LORazepam (ATIVAN) 1 MG tablet Take 0.5 mg by mouth 2 (two) times daily.   meclizine (ANTIVERT) 12.5 MG tablet Take 1 tablet (12.5 mg total) by mouth 2 (two) times daily as needed for dizziness.   multivitamin-iron-minerals-folic acid (CENTRUM) chewable tablet Chew 1 tablet by mouth daily.   olmesartan (BENICAR) 40 MG tablet Take 40 mg by mouth daily.   Omega 3 1200 MG CAPS Take 1,200 mg by mouth daily.    vitamin B-12 (CYANOCOBALAMIN) 1000 MCG tablet Take 1,000 mcg by mouth daily.   vitamin C (ASCORBIC ACID) 500 MG tablet Take 500 mg by mouth daily.   zinc gluconate 50 MG tablet Take 50 mg by mouth daily.   No current facility-administered medications for this visit. (Other)   REVIEW OF SYSTEMS: ROS   Positive for: Gastrointestinal, Eyes, Respiratory Negative for: Constitutional, Neurological, Skin, Genitourinary, Musculoskeletal, HENT, Endocrine, Cardiovascular, Psychiatric, Allergic/Imm, Heme/Lymph Last edited by Etheleen Mayhew, COT on 12/03/2023  9:22 AM.     ALLERGIES Allergies  Allergen Reactions   Levaquin [Levofloxacin] Other (See Comments)    Seizures   PAST MEDICAL HISTORY Past Medical History:  Diagnosis  Date   Essential hypertension    GERD (gastroesophageal reflux disease)    History of cardiac catheterization 2013   Mild coronary atherosclerosis - [facility]   Hyperlipidemia    Hypertensive retinopathy    Hypothyroidism    Past Surgical History:  Procedure Laterality Date   APPENDECTOMY     BREAST CYST ASPIRATION Right    unsure when   CARPAL TUNNEL RELEASE     CATARACT EXTRACTION W/PHACO Left 12/28/2013   Procedure: CATARACT EXTRACTION PHACO AND INTRAOCULAR LENS PLACEMENT (IOC);  Surgeon: Gemma Payor, MD;  Location: [facility];   Service: Ophthalmology;  Laterality: Left;  CDE 18.64   CATARACT EXTRACTION W/PHACO Right 01/07/2014   Procedure: CATARACT EXTRACTION PHACO AND INTRAOCULAR LENS PLACEMENT (IOC);  Surgeon: Gemma Payor, MD;  Location: [facility];  Service: Ophthalmology;  Laterality: Right;  CDE:11.01   COLONOSCOPY     ESOPHAGOGASTRODUODENOSCOPY N/A 10/11/2014   RMR:non critical schatzkis ring/HH   EYE SURGERY     LUMBAR LAMINECTOMY     X 2   TONSILLECTOMY     FAMILY HISTORY Family History  Problem Relation Age of Onset   Colon cancer Other        no first degree relatives   Renal cancer Father    Heart attack Father    Glaucoma Mother    Heart failure Mother    SOCIAL HISTORY Social History   Tobacco Use   Smoking status: Never    Passive exposure: Never   Smokeless tobacco: Never  Vaping Use   Vaping status: Never Used  Substance Use Topics   Alcohol use: No   Drug use: No       OPHTHALMIC EXAM: Base Eye Exam     Visual Acuity (Snellen - Linear)       Right Left   Dist [address] 20/20 20/20         Tonometry (Tonopen, 9:25 AM)       Right Left   Pressure 15 16         Pupils       Pupils Dark Light Shape React APD   Right PERRL 3 2 Round Brisk None   Left PERRL 3 2 Round Brisk None         Visual Fields       Left Right    Full Full         Neuro/Psych     Oriented x3: Yes   Mood/Affect: Normal         Dilation     Both eyes: 2.5% Phenylephrine @ 9:25 AM           Slit Lamp and Fundus Exam     Slit Lamp Exam       Right Left   Lids/Lashes Dermatochalasis - upper lid, Ptosis Dermatochalasis - upper lid, Ptosis   Conjunctiva/Sclera mild temporal Pinguecula mild nasal and Temporal Pinguecula   Cornea Arcus, Well healed cataract wounds, tear film debris, trace PEE Arcus, Well healed cataract wounds, trace PEE, trace tear film debris   Anterior Chamber deep and clear deep and clear   Iris Round and dilated Round and moderately dilated   Lens PC IOL in  good position with open PC PC IOL in good position with open PC   Anterior Vitreous syneresis, Posterior vitreous detachment Vitreous syneresis, Posterior vitreous detachment, vitreous condensations inferiorly         Fundus Exam       Right Left   Disc Pink and Sharp  Pink and Sharp   C/D Ratio 0.4 0.4   Macula Blunted foveal reflex, focal cluster of MA and exudate temporal fovea with interval improvement in edema / cystic changes Flat, good foveal reflex, mild RPE mottling, trace Epiretinal membrane, rare MA, No edema   Vessels attenuated, Tortuous attenuated, mild tortuosity   Periphery Attached, No heme Attached, no heme           Refraction     Wearing Rx       Sphere Cylinder Add   Right -0.25 Sphere +2.25   Left -0.25 Sphere +2.25    Type: PAL           IMAGING AND PROCEDURES  Imaging and Procedures for @TODAY @  OCT, Retina - OU - Both Eyes       Right Eye Quality was good. Central Foveal Thickness: 284. Progression has improved. Findings include no SRF, abnormal foveal contour, intraretinal hyper-reflective material, intraretinal fluid (persistent IRF / cystic changes and focal IRHM IT fovea and mac -- slightly improved).   Left Eye Quality was good. Central Foveal Thickness: 261. Progression has been stable. Findings include normal foveal contour, no IRF, no SRF.   Notes *Images captured and stored on drive  Diagnosis / Impression:  OD: persistent IRF / cystic changes and focal IRHM IT fovea and mac -- slightly improved OS: NFP, no IRF/SRF   Clinical management:  See below  Abbreviations: NFP - Normal foveal profile. CME - cystoid macular edema. PED - pigment epithelial detachment. IRF - intraretinal fluid. SRF - subretinal fluid. EZ - ellipsoid zone. ERM - epiretinal membrane. ORA - outer retinal atrophy. ORT - outer retinal tubulation. SRHM - subretinal hyper-reflective material             ASSESSMENT/PLAN:    ICD-10-CM   1. Hypertensive  retinopathy of both eyes  H35.033 OCT, Retina - OU - Both Eyes    2. Essential hypertension  I10     3. Retinal hemorrhage of right eye  H35.61     4. Pseudophakia of both eyes  Z96.1      1-3. Hypertensive retinopathy OU  - persistent focal intraretinal hemes in macula OD -- slightly improved today  - OCT shows persistent IRF / cystic changes and focal IRHM IT fovea and mac -- slightly improved -- ?mild old BRVO -- 4 mos since last exam  - BCVA remains 20/20 OU  - FA (05.05.21) showed focal MA in macula with late leakage OU    - BP  02.25.25 148/83 03.11.24 176/74 10.18.23 162/74 04.19.23 185/71  03.05.2021  167/72 R arm and 147/67 L arm  11.06.2020  147/67  07.06.2020 169/73  - meds adjusted by PCP and cardiology -- BP not regularly checked by patient   - discussed importance of tight BP control  - monitor for now  - discussed potential for injections / focal laser OD if vision decreases  - f/u 4 months, sooner prn, DFE/OCT, possible injection  4. Pseudophakia OU  - s/p CE/IOL OU - Dr. Gemma Payor (2015)  - beautiful surgeries, doing well  - continue to monitor  Ophthalmic Meds Ordered this visit:  No orders of the defined types were placed in this encounter.    Return in about 4 months (around 04/01/2024) for retinal heme w/ macular edema OD, Dilated Exam, OCT.  There are no Patient Instructions on file for this visit.   This document serves as a record of services personally performed by Karie Chimera, MD,  PhD. It was created on their behalf by Glee Arvin. Manson Passey, OA an ophthalmic technician. The creation of this record is the provider's dictation and/or activities during the visit.    Electronically signed by: Glee Arvin. Manson Passey, OA 12/03/23 1:18 PM  Karie Chimera, M.D., Ph.D. Diseases & Surgery of the Retina and Vitreous [facility]  I have reviewed the above documentation for accuracy and completeness, and I agree with the above. Karie Chimera, M.D., Ph.D. 12/03/23 1:20 PM   Abbreviations: M myopia (nearsighted); A astigmatism; H hyperopia (farsighted); P presbyopia; Mrx spectacle prescription;  CTL contact lenses; OD right eye; OS left eye; OU both eyes  XT exotropia; ET esotropia; PEK punctate epithelial keratitis; PEE punctate epithelial erosions; DES dry eye syndrome; MGD meibomian gland dysfunction; ATs artificial tears; PFAT's preservative free artificial tears; NSC nuclear sclerotic cataract; PSC posterior subcapsular cataract; ERM epi-retinal membrane; PVD posterior vitreous detachment; RD retinal detachment; DM diabetes mellitus; DR diabetic retinopathy; NPDR non-proliferative diabetic retinopathy; PDR proliferative diabetic retinopathy; CSME clinically significant macular edema; DME diabetic macular edema; dbh dot blot hemorrhages; CWS cotton wool spot; POAG primary open angle glaucoma; C/D cup-to-disc ratio; HVF humphrey visual field; GVF goldmann visual field; OCT optical coherence tomography; IOP intraocular pressure; BRVO Branch retinal vein occlusion; CRVO central retinal vein occlusion; CRAO central retinal artery occlusion; BRAO branch retinal artery occlusion; RT retinal tear; SB scleral buckle; PPV pars plana vitrectomy; VH Vitreous hemorrhage; PRP panretinal laser photocoagulation; IVK intravitreal kenalog; VMT vitreomacular traction; MH Macular hole;  NVD neovascularization of the disc; NVE neovascularization elsewhere; AREDS age related eye disease study; ARMD age related macular degeneration; POAG primary open angle glaucoma; EBMD epithelial/anterior basement membrane dystrophy; ACIOL anterior chamber intraocular lens; IOL intraocular lens; PCIOL posterior chamber intraocular lens; Phaco/IOL phacoemulsification with intraocular lens placement; PRK photorefractive keratectomy; LASIK laser assisted in situ keratomileusis; HTN hypertension; DM diabetes mellitus; COPD chronic obstructive pulmonary disease

## 2023-12-10 ENCOUNTER — Encounter (INDEPENDENT_AMBULATORY_CARE_PROVIDER_SITE_OTHER): Payer: Medicare Other | Admitting: Ophthalmology

## 2024-03-03 DIAGNOSIS — L255 Unspecified contact dermatitis due to plants, except food: Secondary | ICD-10-CM | POA: Diagnosis not present

## 2024-03-03 DIAGNOSIS — C44722 Squamous cell carcinoma of skin of right lower limb, including hip: Secondary | ICD-10-CM | POA: Diagnosis not present

## 2024-03-19 ENCOUNTER — Other Ambulatory Visit (HOSPITAL_COMMUNITY): Payer: Self-pay | Admitting: Family Medicine

## 2024-03-19 DIAGNOSIS — Z1231 Encounter for screening mammogram for malignant neoplasm of breast: Secondary | ICD-10-CM

## 2024-03-24 DIAGNOSIS — C44722 Squamous cell carcinoma of skin of right lower limb, including hip: Secondary | ICD-10-CM | POA: Diagnosis not present

## 2024-03-24 DIAGNOSIS — X32XXXD Exposure to sunlight, subsequent encounter: Secondary | ICD-10-CM | POA: Diagnosis not present

## 2024-03-24 DIAGNOSIS — L570 Actinic keratosis: Secondary | ICD-10-CM | POA: Diagnosis not present

## 2024-03-24 DIAGNOSIS — C44729 Squamous cell carcinoma of skin of left lower limb, including hip: Secondary | ICD-10-CM | POA: Diagnosis not present

## 2024-03-26 NOTE — Progress Notes (Signed)
 [facility] - Clinic Note  03/31/2024     CHIEF COMPLAINT Patient presents for Retina Follow Up  HISTORY OF PRESENT ILLNESS: Kaitlyn Sanchez is a 89 y.o. female who presents to the clinic today for:   HPI     Retina Follow Up   Patient presents with  Other.  In both eyes.  This started 4 months ago.  I, the attending physician,  performed the HPI with the patient and updated documentation appropriately.        Comments   Patient here for 4 months retina follow up for HTN RET OU. Patient states vision doing ok. Still seeing. On occasion has a shooting pain.       Last edited by Valdemar Rogue, MD on 04/04/2024 12:51 AM.    Pt feels like she is doing just fine, she sees a floater in the left eye every now and then   Referring physician: Marvine Rush, MD [address] [place],  [place] [address]  HISTORICAL INFORMATION:   Selected notes from the MEDICAL RECORD NUMBER Referred by Dr. Oneil Kawasaki for concern of diabetic retinopathy LEE: 03.11.20 (M. Cotter) [BCVA: OD: 20/20- OS: 20/20-] Ocular Hx-pseudo OU (Dr. Cherene Mania, 2015), HTN ret, vitreous degeneration PMH-HTN, HLD, hypothyroidism    CURRENT MEDICATIONS: No current outpatient medications on file. (Ophthalmic Drugs)   No current facility-administered medications for this visit. (Ophthalmic Drugs)   Current Outpatient Medications (Other)  Medication Sig   amLODipine  (NORVASC ) 10 MG tablet Take 10 mg by mouth every evening.    aspirin  EC 81 MG tablet Take 243 mg by mouth at bedtime.   Calcium  Carbonate-Vit D-Min (CALTRATE 600+D PLUS PO) Take 1 tablet by mouth daily.   Chelated Potassium 99 MG TABS Take 1 tablet by mouth daily.   Cholecalciferol (VITAMIN D3) 125 MCG (5000 UT) CAPS Take 1 capsule by mouth daily.   docusate sodium  (COLACE) 100 MG capsule Take 300 mg by mouth at bedtime.    Flaxseed, Linseed, (FLAXSEED OIL) 1000 MG CAPS Take 1,000 mg by mouth daily.    glucosamine-chondroitin  500-400 MG tablet Take 1 tablet by mouth daily.   labetalol  (NORMODYNE ) 100 MG tablet Take 1 tablet by mouth 3 (three) times daily.   levothyroxine  (SYNTHROID ) 100 MCG tablet TAKE 1 TABLET BY MOUTH ONCE A DAY. (Patient taking differently: Take 110 mcg by mouth daily.)   LORazepam  (ATIVAN ) 1 MG tablet Take 0.5 mg by mouth 2 (two) times daily.   meclizine  (ANTIVERT ) 12.5 MG tablet Take 1 tablet (12.5 mg total) by mouth 2 (two) times daily as needed for dizziness.   multivitamin-iron-minerals-folic acid (CENTRUM) chewable tablet Chew 1 tablet by mouth daily.   olmesartan (BENICAR) 40 MG tablet Take 40 mg by mouth daily.   Omega 3 1200 MG CAPS Take 1,200 mg by mouth daily.    vitamin B-12 (CYANOCOBALAMIN) 1000 MCG tablet Take 1,000 mcg by mouth daily.   vitamin C (ASCORBIC ACID) 500 MG tablet Take 500 mg by mouth daily.   zinc gluconate 50 MG tablet Take 50 mg by mouth daily.   No current facility-administered medications for this visit. (Other)   REVIEW OF SYSTEMS: ROS   Positive for: Gastrointestinal, Eyes, Respiratory Negative for: Constitutional, Neurological, Skin, Genitourinary, Musculoskeletal, HENT, Endocrine, Cardiovascular, Psychiatric, Allergic/Imm, Heme/Lymph Last edited by Orval Asberry RAMAN, COA on 03/31/2024  9:53 AM.      ALLERGIES Allergies  Allergen Reactions   Levaquin [Levofloxacin] Other (See Comments)    Seizures  PAST MEDICAL HISTORY Past Medical History:  Diagnosis Date   Essential hypertension    GERD (gastroesophageal reflux disease)    History of cardiac catheterization 2013   Mild coronary atherosclerosis - [facility]   Hyperlipidemia    Hypertensive retinopathy    Hypothyroidism    Past Surgical History:  Procedure Laterality Date   APPENDECTOMY     BREAST CYST ASPIRATION Right    unsure when   CARPAL TUNNEL RELEASE     CATARACT EXTRACTION W/PHACO Left 12/28/2013   Procedure: CATARACT EXTRACTION PHACO AND INTRAOCULAR LENS PLACEMENT (IOC);  Surgeon:  Cherene Mania, MD;  Location: [facility];  Service: Ophthalmology;  Laterality: Left;  CDE 18.64   CATARACT EXTRACTION W/PHACO Right 01/07/2014   Procedure: CATARACT EXTRACTION PHACO AND INTRAOCULAR LENS PLACEMENT (IOC);  Surgeon: Cherene Mania, MD;  Location: [facility];  Service: Ophthalmology;  Laterality: Right;  CDE:11.01   COLONOSCOPY     ESOPHAGOGASTRODUODENOSCOPY N/A 10/11/2014   RMR:non critical schatzkis ring/HH   EYE SURGERY     LUMBAR LAMINECTOMY     X 2   TONSILLECTOMY     FAMILY HISTORY Family History  Problem Relation Age of Onset   Colon cancer Other        no first degree relatives   Renal cancer Father    Heart attack Father    Glaucoma Mother    Heart failure Mother    SOCIAL HISTORY Social History   Tobacco Use   Smoking status: Never    Passive exposure: Never   Smokeless tobacco: Never  Vaping Use   Vaping status: Never Used  Substance Use Topics   Alcohol use: No   Drug use: No       OPHTHALMIC EXAM: Base Eye Exam     Visual Acuity (Snellen - Linear)       Right Left   Dist [place] 20/20 20/20         Tonometry (Tonopen, 9:51 AM)       Right Left   Pressure 13 15         Pupils       Dark Light Shape React APD   Right 3 2 Round Brisk None   Left 3 2 Round Brisk None         Visual Fields (Counting fingers)       Left Right    Full Full         Extraocular Movement       Right Left    Full, Ortho Full, Ortho         Neuro/Psych     Oriented x3: Yes   Mood/Affect: Normal         Dilation     Both eyes: 1.0% Mydriacyl, 2.5% Phenylephrine  @ 9:50 AM           Slit Lamp and Fundus Exam     Slit Lamp Exam       Right Left   Lids/Lashes Dermatochalasis - upper lid, Ptosis Dermatochalasis - upper lid, Ptosis   Conjunctiva/Sclera mild temporal Pinguecula mild nasal and Temporal Pinguecula   Cornea Arcus, Well healed cataract wounds, tear film debris, trace PEE Arcus, Well healed cataract wounds, trace PEE, trace tear  film debris   Anterior Chamber deep and clear deep and clear   Iris Round and dilated Round and moderately dilated   Lens PC IOL in good position with open PC PC IOL in good position with open PC   Anterior Vitreous syneresis, Posterior  vitreous detachment Vitreous syneresis, Posterior vitreous detachment, vitreous condensations inferiorly         Fundus Exam       Right Left   Disc Pink and Sharp Pink and Sharp   C/D Ratio 0.4 0.4   Macula Blunted foveal reflex, focal cluster of DBH temporal to fovea with interval improvement in edema / cystic changes, no exudates Flat, good foveal reflex, mild RPE mottling, trace Epiretinal membrane, rare MA, No edema   Vessels attenuated, Tortuous attenuated, mild tortuosity   Periphery Attached, No heme Attached, no heme           Refraction     Wearing Rx       Sphere Cylinder Add   Right -0.25 Sphere +2.25   Left -0.25 Sphere +2.25    Type: PAL           IMAGING AND PROCEDURES  Imaging and Procedures for @TODAY @  OCT, Retina - OU - Both Eyes       Right Eye Quality was good. Central Foveal Thickness: 284. Progression has improved. Findings include no SRF, abnormal foveal contour, intraretinal hyper-reflective material, intraretinal fluid (persistent IRF / cystic changes and focal IRHM IT fovea and mac -- slightly improved).   Left Eye Quality was good. Central Foveal Thickness: 262. Progression has been stable. Findings include normal foveal contour, no IRF, no SRF.   Notes *Images captured and stored on drive  Diagnosis / Impression:  OD: persistent IRF / cystic changes and focal IRHM IT fovea and mac -- slightly improved OS: NFP, no IRF/SRF   Clinical management:  See below  Abbreviations: NFP - Normal foveal profile. CME - cystoid macular edema. PED - pigment epithelial detachment. IRF - intraretinal fluid. SRF - subretinal fluid. EZ - ellipsoid zone. ERM - epiretinal membrane. ORA - outer retinal atrophy. ORT -  outer retinal tubulation. SRHM - subretinal hyper-reflective material            ASSESSMENT/PLAN:    ICD-10-CM   1. Hypertensive retinopathy of both eyes  H35.033 OCT, Retina - OU - Both Eyes    2. Essential hypertension  I10     3. Retinal hemorrhage of right eye  H35.61     4. Pseudophakia of both eyes  Z96.1      1-3. Hypertensive retinopathy OU  - persistent focal intraretinal hemes in macula OD -- slightly improved today  - OCT shows persistent IRF / cystic changes and focal IRHM IT fovea and mac -- slightly improved -- ?mild old BRVO -- 4 mos since last exam  - BCVA remains 20/20 OU  - FA (05.05.21) showed focal MA in macula with late leakage OU    - BP  02.25.25 148/83 03.11.24 176/74 10.18.23 162/74 04.19.23 185/71  03.05.2021  167/72 R arm and 147/67 L arm  11.06.2020  147/67  07.06.2020 169/73  - meds adjusted by PCP and cardiology -- BP not regularly checked by patient   - discussed importance of tight BP control  - monitor for now  - discussed potential for injections / focal laser OD if vision decreases  - f/u 4-6 months, sooner prn, DFE/OCT, possible injection  4. Pseudophakia OU  - s/p CE/IOL OU - Dr. Cherene Mania (2015)  - beautiful surgeries, doing well  - continue to monitor  Ophthalmic Meds Ordered this visit:  No orders of the defined types were placed in this encounter.    Return for f/u 4-6 months, HTN ret OU, DFE, OCT.  There are no Patient Instructions on file for this visit.   This document serves as a record of services personally performed by Redell JUDITHANN Hans, MD, PhD. It was created on their behalf by Alan PARAS. Delores, OA an ophthalmic technician. The creation of this record is the provider's dictation and/or activities during the visit.    Electronically signed by: Alan PARAS. Delores, OA 04/04/24 12:52 AM  Redell JUDITHANN Hans, M.D., Ph.D. Diseases & Surgery of the Retina and Vitreous [facility]  I have reviewed the  above documentation for accuracy and completeness, and I agree with the above. Redell JUDITHANN Hans, M.D., Ph.D. 04/04/24 12:52 AM   Abbreviations: M myopia (nearsighted); A astigmatism; H hyperopia (farsighted); P presbyopia; Mrx spectacle prescription;  CTL contact lenses; OD right eye; OS left eye; OU both eyes  XT exotropia; ET esotropia; PEK punctate epithelial keratitis; PEE punctate epithelial erosions; DES dry eye syndrome; MGD meibomian gland dysfunction; ATs artificial tears; PFAT's preservative free artificial tears; NSC nuclear sclerotic cataract; PSC posterior subcapsular cataract; ERM epi-retinal membrane; PVD posterior vitreous detachment; RD retinal detachment; DM diabetes mellitus; DR diabetic retinopathy; NPDR non-proliferative diabetic retinopathy; PDR proliferative diabetic retinopathy; CSME clinically significant macular edema; DME diabetic macular edema; dbh dot blot hemorrhages; CWS cotton wool spot; POAG primary open angle glaucoma; C/D cup-to-disc ratio; HVF humphrey visual field; GVF goldmann visual field; OCT optical coherence tomography; IOP intraocular pressure; BRVO Branch retinal vein occlusion; CRVO central retinal vein occlusion; CRAO central retinal artery occlusion; BRAO branch retinal artery occlusion; RT retinal tear; SB scleral buckle; PPV pars plana vitrectomy; VH Vitreous hemorrhage; PRP panretinal laser photocoagulation; IVK intravitreal kenalog; VMT vitreomacular traction; MH Macular hole;  NVD neovascularization of the disc; NVE neovascularization elsewhere; AREDS age related eye disease study; ARMD age related macular degeneration; POAG primary open angle glaucoma; EBMD epithelial/anterior basement membrane dystrophy; ACIOL anterior chamber intraocular lens; IOL intraocular lens; PCIOL posterior chamber intraocular lens; Phaco/IOL phacoemulsification with intraocular lens placement; PRK photorefractive keratectomy; LASIK laser assisted in situ keratomileusis; HTN  hypertension; DM diabetes mellitus; COPD chronic obstructive pulmonary disease

## 2024-03-31 ENCOUNTER — Encounter (INDEPENDENT_AMBULATORY_CARE_PROVIDER_SITE_OTHER): Payer: Self-pay | Admitting: Ophthalmology

## 2024-03-31 ENCOUNTER — Ambulatory Visit (INDEPENDENT_AMBULATORY_CARE_PROVIDER_SITE_OTHER): Payer: Medicare Other | Admitting: Ophthalmology

## 2024-03-31 DIAGNOSIS — Z961 Presence of intraocular lens: Secondary | ICD-10-CM

## 2024-03-31 DIAGNOSIS — H3561 Retinal hemorrhage, right eye: Secondary | ICD-10-CM

## 2024-03-31 DIAGNOSIS — I10 Essential (primary) hypertension: Secondary | ICD-10-CM

## 2024-03-31 DIAGNOSIS — H35033 Hypertensive retinopathy, bilateral: Secondary | ICD-10-CM | POA: Diagnosis not present

## 2024-04-04 ENCOUNTER — Encounter (INDEPENDENT_AMBULATORY_CARE_PROVIDER_SITE_OTHER): Payer: Self-pay | Admitting: Ophthalmology

## 2024-04-15 DIAGNOSIS — Z08 Encounter for follow-up examination after completed treatment for malignant neoplasm: Secondary | ICD-10-CM | POA: Diagnosis not present

## 2024-04-15 DIAGNOSIS — L928 Other granulomatous disorders of the skin and subcutaneous tissue: Secondary | ICD-10-CM | POA: Diagnosis not present

## 2024-04-15 DIAGNOSIS — Z85828 Personal history of other malignant neoplasm of skin: Secondary | ICD-10-CM | POA: Diagnosis not present

## 2024-04-23 DIAGNOSIS — R7309 Other abnormal glucose: Secondary | ICD-10-CM | POA: Diagnosis not present

## 2024-04-23 DIAGNOSIS — Z6823 Body mass index (BMI) 23.0-23.9, adult: Secondary | ICD-10-CM | POA: Diagnosis not present

## 2024-04-23 DIAGNOSIS — I10 Essential (primary) hypertension: Secondary | ICD-10-CM | POA: Diagnosis not present

## 2024-04-23 DIAGNOSIS — E559 Vitamin D deficiency, unspecified: Secondary | ICD-10-CM | POA: Diagnosis not present

## 2024-04-23 DIAGNOSIS — T461X5A Adverse effect of calcium-channel blockers, initial encounter: Secondary | ICD-10-CM | POA: Diagnosis not present

## 2024-04-23 DIAGNOSIS — E7849 Other hyperlipidemia: Secondary | ICD-10-CM | POA: Diagnosis not present

## 2024-04-23 DIAGNOSIS — E782 Mixed hyperlipidemia: Secondary | ICD-10-CM | POA: Diagnosis not present

## 2024-04-23 DIAGNOSIS — E039 Hypothyroidism, unspecified: Secondary | ICD-10-CM | POA: Diagnosis not present

## 2024-04-23 DIAGNOSIS — Z0001 Encounter for general adult medical examination with abnormal findings: Secondary | ICD-10-CM | POA: Diagnosis not present

## 2024-05-01 DIAGNOSIS — L03115 Cellulitis of right lower limb: Secondary | ICD-10-CM | POA: Diagnosis not present

## 2024-05-01 DIAGNOSIS — Z6823 Body mass index (BMI) 23.0-23.9, adult: Secondary | ICD-10-CM | POA: Diagnosis not present

## 2024-05-04 ENCOUNTER — Ambulatory Visit (HOSPITAL_COMMUNITY)

## 2024-05-12 ENCOUNTER — Ambulatory Visit (HOSPITAL_COMMUNITY): Attending: Family Medicine | Admitting: Physical Therapy

## 2024-05-12 ENCOUNTER — Other Ambulatory Visit: Payer: Self-pay

## 2024-05-12 DIAGNOSIS — M79662 Pain in left lower leg: Secondary | ICD-10-CM | POA: Diagnosis not present

## 2024-05-12 DIAGNOSIS — S81801S Unspecified open wound, right lower leg, sequela: Secondary | ICD-10-CM | POA: Diagnosis not present

## 2024-05-12 DIAGNOSIS — R600 Localized edema: Secondary | ICD-10-CM | POA: Insufficient documentation

## 2024-05-12 DIAGNOSIS — M79661 Pain in right lower leg: Secondary | ICD-10-CM | POA: Diagnosis not present

## 2024-05-12 DIAGNOSIS — S81802S Unspecified open wound, left lower leg, sequela: Secondary | ICD-10-CM | POA: Diagnosis not present

## 2024-05-12 NOTE — Therapy (Signed)
 OUTPATIENT PHYSICAL THERAPY Wound EVALUATION   Patient Name: Kaitlyn Sanchez MRN: [id number] DOB:1934/10/29, 89 y.o., female Today's Date: 05/12/2024   PCP: Marvine Rush, MD REFERRING PROVIDER: Marvine Rush, MD  END OF SESSION:  PT End of Session - 05/12/24 1102     Visit Number 1    Number of Visits 12    Date for PT Re-Evaluation 06/23/24    Authorization Type [employer]: shara put in    Progress Note Due on Visit 10    PT Start Time 1015    PT Stop Time 1055    PT Time Calculation (min) 40 min    Activity Tolerance Patient tolerated treatment well    Behavior During Therapy [facility] for tasks assessed/performed          Past Medical History:  Diagnosis Date   Essential hypertension    GERD (gastroesophageal reflux disease)    History of cardiac catheterization 2013   Mild coronary atherosclerosis - [facility]   Hyperlipidemia    Hypertensive retinopathy    Hypothyroidism    Past Surgical History:  Procedure Laterality Date   APPENDECTOMY     BREAST CYST ASPIRATION Right    unsure when   CARPAL TUNNEL RELEASE     CATARACT EXTRACTION W/PHACO Left 12/28/2013   Procedure: CATARACT EXTRACTION PHACO AND INTRAOCULAR LENS PLACEMENT (IOC);  Surgeon: Cherene Mania, MD;  Location: [facility];  Service: Ophthalmology;  Laterality: Left;  CDE 18.64   CATARACT EXTRACTION W/PHACO Right 01/07/2014   Procedure: CATARACT EXTRACTION PHACO AND INTRAOCULAR LENS PLACEMENT (IOC);  Surgeon: Cherene Mania, MD;  Location: [facility];  Service: Ophthalmology;  Laterality: Right;  CDE:11.01   COLONOSCOPY     ESOPHAGOGASTRODUODENOSCOPY N/A 10/11/2014   RMR:non critical schatzkis ring/HH   EYE SURGERY     LUMBAR LAMINECTOMY     X 2   TONSILLECTOMY     Patient Active Problem List   Diagnosis Date Noted   Hypothyroidism following radioiodine therapy 05/11/2020   Hyponatremia--very symptomatic with seizures 03/17/2020   Essential hypertension    GERD (gastroesophageal reflux disease)     Hypothyroidism    Hyperlipidemia    Hypocalcemia    Seizure--in the setting of low sodium and Levaquin use    Constipation 08/19/2015   Abdominal pain 08/19/2015   Mucosal abnormality of esophagus    Dyspepsia 10/07/2014   Venous hum 11/23/2012   COPD (chronic obstructive pulmonary disease) (HCC) 10/30/2012   Dizziness 10/30/2012   Hypertensive emergency 06/07/2012   H N P-LUMBAR 09/01/2008   Displacement of lumbar intervertebral disc 09/01/2008   LOW BACK PAIN 01/28/2008   SCIATICA 01/28/2008    ONSET DATE: 02/17/24:  Approximate.   REFERRING DIAG: Right leg wound   THERAPY DIAG:  Right leg wound   Rationale for Evaluation and Treatment: Rehabilitation     Wound Therapy - 05/12/24 0001     Subjective Pt states that she had some skin cancers removed from each leg in May of 2025 and had several sites that did not heal.  She went back to the dermatologist once but was in worse shape after the follow up visit therefore she did not go back.    Patient and Family Stated Goals wounds to heal.    Date of Onset 02/17/24    Prior Treatments four antibiotics and self care.  Currently on chepalexin    Pain Scale 0-10    Pain Score 5     Pain Type Acute pain    Pain  Location Leg   wound sites   Pain Orientation Right;Left;Lower    Pain Descriptors / Indicators Sore    Pain Onset Other (Comment)   with pressure   Patients Stated Pain Goal 0    Pain Intervention(s) Other (Comment)    Evaluation and Treatment Procedures Explained to Patient/Family Yes    Evaluation and Treatment Procedures agreed to    Wound Properties Date First Assessed: 05/12/24 Time First Assessed: 1020 Present on Original Admission: Yes Primary Wound Type: Atypical Secondary Wound Type - Atypical: -- , following removal of skin cancers  Location: Pretibial Location Orientation: Right   Wound Image Images linked: 1    Site / Wound Assessment Black    Peri-wound Assessment Edema    Wound Length (cm) 4 cm    Wound  Width (cm) 0.7 cm    Wound Surface Area (cm^2) 2.2 cm^2    Wound Depth (cm) --   unknown   Drainage Amount None    Treatments Cleansed;Site care;Other (Comment)   debridement   Dressing Type Gauze (Comment)    Dressing Changed Changed    Dressing Status Clean    Wound Properties Location: Pretibial Location Orientation: Left;Proximal Wound Outcome: Duplicate Final Assessment Date: 05/12/24 Final Assessment Time: 1118   Wound Properties Date First Assessed: 05/12/24 Time First Assessed: 1030 Present on Original Admission: Yes Primary Wound Type: Atypical Location: Leg Location Orientation: Left   Wound Image Images linked: 2   posterior at this time is fibrile and dry appears to be close to opening but is not opened.   Site / Wound Assessment Dry;Friable    Peri-wound Assessment Edema    Wound Length (cm) 3 cm   posterior lateral is not a wound yet, measures 2.5 x 2   Wound Width (cm) 1.2 cm    Wound Surface Area (cm^2) 2.83 cm^2    Drainage Amount None    Treatments Other (Comment)   no treatment as we have no orders at this time.   Dressing Type Gauze (Comment)    Dressing Changed Changed    Dressing Status Dry;Clean    Wound Therapy - Clinical Statement see below    Wound Therapy - Functional Problem List dressing and bathing    Factors Delaying/Impairing Wound Healing Infection - systemic/local;Vascular compromise    Hydrotherapy Plan Debridement;Dressing change;Electrical stimulation    Wound Therapy - Frequency 2X / week   for six weeks   Wound Therapy - Current Recommendations PT    Wound Plan debridement and dressing change    Dressing  Rt: medihoney to wound bed followed by 4x4 , profore lite compression system and netting    Dressing Lt:  no orders to treat right now therapist faxed order to MD>            PATIENT EDUCATION: Education details: ankle pumps, keep dressing dry if dressing becomes painful remove and replace.  Person educated: Patient Education method:  Explanation Education comprehension: verbalized understanding   HOME EXERCISE PROGRAM: Ankle pumps    GOALS: Goals reviewed with patient? No  SHORT TERM GOALS: Target date: 06/02/24  Wounds to be 100% granulated to decrease risk of infection  Baseline: Goal status: INITIAL  2.  Pain in  legs to be no greater than 2/10 Baseline:  Goal status: INITIAL  3.  Edema will no longer be visible in Rt LE  Baseline:  Goal status: INITIAL   LONG TERM GOALS: Target date: 06/23/24  Wound to be healed  Baseline:  Goal status: INITIAL  2.  PT to have no pain in her legs .  Baseline:  Goal status: INITIAL  3.  Pt to have obtained new compression garments.  Baseline:  Goal status: INITIAL    ASSESSMENT:  CLINICAL IMPRESSION: Patient is an 89 y.o. female who was seen today for physical therapy evaluation and treatment for of a non healing Rt leg wound.  Pt has had multiple wounds on both leg for several since May when she had multiple suspected skin cancers removed from her legs.  She has taken multiple antibiotic and has been self treating without the wound healing.  She has increased pain, increased edema and non healing leg wounds.  She is now being referred to skilled care for wound care..  Ms. Kulick will benefit from skilled PT to create and keep a healing environment for her wound.    OBJECTIVE IMPAIRMENTS: decreased activity tolerance, decreased mobility, difficulty walking, increased edema, pain, and decreased skin integrity.  ACTIVITY LIMITATIONS: carrying, lifting, standing, squatting, bathing, hygiene/grooming, and locomotion level  PARTICIPATION LIMITATIONS: community activity  PERSONAL FACTORS: Age, Fitness, and Time since onset of injury/illness/exacerbation are also affecting patient's functional outcome.   REHAB POTENTIAL: Good  CLINICAL DECISION MAKING: Evolving/moderate complexity  EVALUATION COMPLEXITY: Moderate  PLAN: PT FREQUENCY: 2x/week  PT DURATION:  6 weeks  PLANNED INTERVENTIONS: 97110-Therapeutic exercises, 97535- Self Care, 02859- Manual therapy, and 97597- Wound care (first 20 sq cm)  PLAN FOR NEXT SESSION: Continue with wound care.     Montie Metro, PT CLT [phone]  05/12/2024, 11:22 AM  [employer] Medicare Auth Request Information Treatment Start Date: 05/12/24  Date of referral: 05/07/24 Referring provider: Dr. Marvine  Referring diagnosis (ICD 10)? S81.801S Treatment diagnosis (ICD 10)? (if different than referring diagnosis) S81.801S; S81.802S, M79.661, M79.662, R60.0  What was this (referring dx) caused by? Other: removal of suspected skin cancers  Nature of Condition: Recurrent (multiple episodes of < 3 months)   Laterality: Both   Objective measurements identify impairments when they are compared to normal values, the uninvolved extremity, and prior level of function.  []  Yes  [x]  No  Objective assessment of functional ability: Moderate functional limitations   Briefly describe symptoms: open wounds that will not heal.    How did symptoms start: removal of suspected skin cancers   Average pain intensity:  Last 24 hours: 4  Past week: 4  How often does the pt experience symptoms? Occasionally  How much have the symptoms interfered with usual daily activities? Moderately  How has condition changed since care began at this facility? NA - initial visit  In general, how is the patients overall health? Good

## 2024-05-12 NOTE — Addendum Note (Signed)
 Addended by: NELWYN MONTIE PARAS on: 05/12/2024 04:36 PM   Modules accepted: Orders

## 2024-05-14 ENCOUNTER — Ambulatory Visit (HOSPITAL_COMMUNITY): Admitting: Physical Therapy

## 2024-05-14 DIAGNOSIS — S81801S Unspecified open wound, right lower leg, sequela: Secondary | ICD-10-CM | POA: Diagnosis not present

## 2024-05-14 DIAGNOSIS — S81802S Unspecified open wound, left lower leg, sequela: Secondary | ICD-10-CM

## 2024-05-14 DIAGNOSIS — R600 Localized edema: Secondary | ICD-10-CM

## 2024-05-14 DIAGNOSIS — M79661 Pain in right lower leg: Secondary | ICD-10-CM | POA: Diagnosis not present

## 2024-05-14 DIAGNOSIS — M79662 Pain in left lower leg: Secondary | ICD-10-CM | POA: Diagnosis not present

## 2024-05-14 NOTE — Therapy (Signed)
 OUTPATIENT PHYSICAL THERAPY Wound Treatment   Patient Name: Kaitlyn Sanchez MRN: [id number] DOB:26-Apr-1935, 89 y.o., female Today's Date: 05/14/2024   PCP: Marvine Rush, MD REFERRING PROVIDER: Marvine Rush, MD  END OF SESSION:  PT End of Session - 05/14/24 0901     Visit Number 2    Number of Visits 12    Date for PT Re-Evaluation 06/23/24    Authorization Type [employer]: shara put in    Progress Note Due on Visit 10    PT Start Time 0813    PT Stop Time 0857    PT Time Calculation (min) 44 min    Activity Tolerance Patient tolerated treatment well    Behavior During Therapy [facility] for tasks assessed/performed          Past Medical History:  Diagnosis Date   Essential hypertension    GERD (gastroesophageal reflux disease)    History of cardiac catheterization 2013   Mild coronary atherosclerosis - [facility]   Hyperlipidemia    Hypertensive retinopathy    Hypothyroidism    Past Surgical History:  Procedure Laterality Date   APPENDECTOMY     BREAST CYST ASPIRATION Right    unsure when   CARPAL TUNNEL RELEASE     CATARACT EXTRACTION W/PHACO Left 12/28/2013   Procedure: CATARACT EXTRACTION PHACO AND INTRAOCULAR LENS PLACEMENT (IOC);  Surgeon: Cherene Mania, MD;  Location: [facility];  Service: Ophthalmology;  Laterality: Left;  CDE 18.64   CATARACT EXTRACTION W/PHACO Right 01/07/2014   Procedure: CATARACT EXTRACTION PHACO AND INTRAOCULAR LENS PLACEMENT (IOC);  Surgeon: Cherene Mania, MD;  Location: [facility];  Service: Ophthalmology;  Laterality: Right;  CDE:11.01   COLONOSCOPY     ESOPHAGOGASTRODUODENOSCOPY N/A 10/11/2014   RMR:non critical schatzkis ring/HH   EYE SURGERY     LUMBAR LAMINECTOMY     X 2   TONSILLECTOMY     Patient Active Problem List   Diagnosis Date Noted   Hypothyroidism following radioiodine therapy 05/11/2020   Hyponatremia--very symptomatic with seizures 03/17/2020   Essential hypertension    GERD (gastroesophageal reflux disease)     Hypothyroidism    Hyperlipidemia    Hypocalcemia    Seizure--in the setting of low sodium and Levaquin use    Constipation 08/19/2015   Abdominal pain 08/19/2015   Mucosal abnormality of esophagus    Dyspepsia 10/07/2014   Venous hum 11/23/2012   COPD (chronic obstructive pulmonary disease) (HCC) 10/30/2012   Dizziness 10/30/2012   Hypertensive emergency 06/07/2012   H N P-LUMBAR 09/01/2008   Displacement of lumbar intervertebral disc 09/01/2008   LOW BACK PAIN 01/28/2008   SCIATICA 01/28/2008    ONSET DATE: 02/17/24:  Approximate.   REFERRING DIAG: Right leg wound   THERAPY DIAG:  Right leg wound   Rationale for Evaluation and Treatment: Rehabilitation     Wound Therapy - 05/14/24 0001     Subjective Pt states that the compression dressing was comfortable    Patient and Family Stated Goals wounds to heal.    Date of Onset 02/17/24    Prior Treatments four antibiotics and self care.  Currently on chepalexin    Pain Scale 0-10    Pain Score 2     Pain Type Acute pain    Pain Location Leg    Pain Orientation Left;Right;Lower    Pain Descriptors / Indicators Sore    Pain Onset Other (Comment)   with pressure ie blankets on it   Patients Stated Pain Goal 0  Pain Intervention(s) Emotional support    Evaluation and Treatment Procedures Explained to Patient/Family Yes    Evaluation and Treatment Procedures agreed to    Wound Properties Date First Assessed: 05/12/24 Time First Assessed: 1020 Present on Original Admission: Yes Primary Wound Type: Atypical Secondary Wound Type - Atypical: -- , following removal of skin cancers  Location: Pretibial Location Orientation: Right   Site / Wound Assessment Yellow    Peri-wound Assessment Edema    Drainage Amount None    Treatments Cleansed;Site care;Other (Comment)   manual and debridement of devitialized tissue at site.   Dressing Type Compression wrap    Dressing Changed Changed    Dressing Status Clean    Wound Properties  Date First Assessed: 05/12/24 Time First Assessed: 1030 Present on Original Admission: Yes Primary Wound Type: Atypical Location: Leg Location Orientation: Left   Site / Wound Assessment Dry;Friable    Peri-wound Assessment Edema    Wound Length (cm) --   scar tissue removed with noted 5 small wounds present under area where scar tissue was removed.   Drainage Amount None    Treatments Cleansed;Moisturizing cream;Site care;Other (Comment)   manual decongestive techniques as well as debridement.   Dressing Type Gauze (Comment)    Dressing Changed Changed    Dressing Status Dry;Clean    Wound Therapy - Clinical Statement see below    Wound Therapy - Functional Problem List dressing and bathing    Factors Delaying/Impairing Wound Healing Infection - systemic/local;Vascular compromise    Hydrotherapy Plan Debridement;Dressing change;Electrical stimulation    Wound Therapy - Frequency 2X / week   for six weeks   Wound Therapy - Current Recommendations PT    Wound Plan debridement and dressing change    Dressing  Both LE recievedmedihoney to wound bed followed by 4x4 , profore lite compression system and netting    Manual Therapy decongestive techniques completed to both LE            PATIENT EDUCATION: Education details: ankle pumps, keep dressing dry if dressing becomes painful remove and replace.  Person educated: Patient Education method: Explanation Education comprehension: verbalized understanding   HOME EXERCISE PROGRAM: Ankle pumps    GOALS: Goals reviewed with patient? No  SHORT TERM GOALS: Target date: 06/02/24  Wounds to be 100% granulated to decrease risk of infection  Baseline: Goal status: IN PROGRESS  2.  Pain in  legs to be no greater than 2/10 Baseline:  Goal status: IN PROGRESS  3.  Edema will no longer be visible in Rt LE  Baseline:  Goal status: IN PROGRESS   LONG TERM GOALS: Target date: 06/23/24  Wound to be healed  Baseline:  Goal status: IN  PROGRESS  2.  PT to have no pain in her legs .  Baseline:  Goal status: IN PROGRESS  3.  Pt to have obtained new compression garments.  Baseline:  Goal status: IN PROGRESS    ASSESSMENT:  CLINICAL IMPRESSION: Patient returns with dressing intact stating that the dressing is comfortable.  Written order had not been received for treatment of left wound therefore MD office was called and received verbal phone order to treat the left LE.   Wound areas have noted approximation, profore lite was enough compression to reduce edema in Rt LE.  Ms. Skarzynski will continue to benefit from skilled PT to create and keep a healing environment for her wound.    OBJECTIVE IMPAIRMENTS: decreased activity tolerance, decreased mobility, difficulty walking, increased edema, pain,  and decreased skin integrity.  ACTIVITY LIMITATIONS: carrying, lifting, standing, squatting, bathing, hygiene/grooming, and locomotion level  PARTICIPATION LIMITATIONS: community activity  PERSONAL FACTORS: Age, Fitness, and Time since onset of injury/illness/exacerbation are also affecting patient's functional outcome.   REHAB POTENTIAL: Good  CLINICAL DECISION MAKING: Evolving/moderate complexity  EVALUATION COMPLEXITY: Moderate  PLAN: PT FREQUENCY: 2x/week  PT DURATION: 6 weeks  PLANNED INTERVENTIONS: 97110-Therapeutic exercises, 97535- Self Care, 02859- Manual therapy, and 97597- Wound care (first 20 sq cm)  PLAN FOR NEXT SESSION: Continue with wound care.     Montie Metro, PT CLT [phone]  05/14/2024, 9:08 AM

## 2024-05-18 ENCOUNTER — Ambulatory Visit (HOSPITAL_COMMUNITY): Admitting: Physical Therapy

## 2024-05-18 DIAGNOSIS — S81802S Unspecified open wound, left lower leg, sequela: Secondary | ICD-10-CM | POA: Diagnosis not present

## 2024-05-18 DIAGNOSIS — S81801S Unspecified open wound, right lower leg, sequela: Secondary | ICD-10-CM | POA: Diagnosis not present

## 2024-05-18 DIAGNOSIS — R600 Localized edema: Secondary | ICD-10-CM | POA: Diagnosis not present

## 2024-05-18 DIAGNOSIS — M79661 Pain in right lower leg: Secondary | ICD-10-CM

## 2024-05-18 DIAGNOSIS — M79662 Pain in left lower leg: Secondary | ICD-10-CM | POA: Diagnosis not present

## 2024-05-18 NOTE — Therapy (Signed)
 OUTPATIENT PHYSICAL THERAPY Wound Treatment   Patient Name: Kaitlyn Sanchez MRN: [id number] DOB:1935/09/09, 89 y.o., female Today's Date: 05/18/2024   PCP: Kaitlyn Rush, MD REFERRING PROVIDER: Marvine Rush, MD  END OF SESSION:  PT End of Session - 05/18/24 1228     Visit Number 3    Number of Visits 12    Date for PT Re-Evaluation 06/23/24    Authorization Type [employer]: shara put in    Progress Note Due on Visit 10    PT Start Time 0935    PT Stop Time 1014    PT Time Calculation (min) 39 min    Activity Tolerance Patient tolerated treatment well    Behavior During Therapy [facility] for tasks assessed/performed         .wound  Past Medical History:  Diagnosis Date   Essential hypertension    GERD (gastroesophageal reflux disease)    History of cardiac catheterization 2013   Mild coronary atherosclerosis - [facility]   Hyperlipidemia    Hypertensive retinopathy    Hypothyroidism    Past Surgical History:  Procedure Laterality Date   APPENDECTOMY     BREAST CYST ASPIRATION Right    unsure when   CARPAL TUNNEL RELEASE     CATARACT EXTRACTION W/PHACO Left 12/28/2013   Procedure: CATARACT EXTRACTION PHACO AND INTRAOCULAR LENS PLACEMENT (IOC);  Surgeon: Cherene Mania, MD;  Location: [facility];  Service: Ophthalmology;  Laterality: Left;  CDE 18.64   CATARACT EXTRACTION W/PHACO Right 01/07/2014   Procedure: CATARACT EXTRACTION PHACO AND INTRAOCULAR LENS PLACEMENT (IOC);  Surgeon: Cherene Mania, MD;  Location: [facility];  Service: Ophthalmology;  Laterality: Right;  CDE:11.01   COLONOSCOPY     ESOPHAGOGASTRODUODENOSCOPY N/A 10/11/2014   RMR:non critical schatzkis ring/HH   EYE SURGERY     LUMBAR LAMINECTOMY     X 2   TONSILLECTOMY     Patient Active Problem List   Diagnosis Date Noted   Hypothyroidism following radioiodine therapy 05/11/2020   Hyponatremia--very symptomatic with seizures 03/17/2020   Essential hypertension    GERD (gastroesophageal reflux disease)     Hypothyroidism    Hyperlipidemia    Hypocalcemia    Seizure--in the setting of low sodium and Levaquin use    Constipation 08/19/2015   Abdominal pain 08/19/2015   Mucosal abnormality of esophagus    Dyspepsia 10/07/2014   Venous hum 11/23/2012   COPD (chronic obstructive pulmonary disease) (HCC) 10/30/2012   Dizziness 10/30/2012   Hypertensive emergency 06/07/2012   H N P-LUMBAR 09/01/2008   Displacement of lumbar intervertebral disc 09/01/2008   LOW BACK PAIN 01/28/2008   SCIATICA 01/28/2008    ONSET DATE: 02/17/24:  Approximate.   REFERRING DIAG: Right leg wound   THERAPY DIAG:  Right leg wound   Rationale for Evaluation and Treatment: Rehabilitation     Wound Therapy - 05/18/24 1223     Subjective pt reports dressings were fine.    Patient and Family Stated Goals wounds to heal.    Date of Onset 02/17/24    Prior Treatments four antibiotics and self care.  Currently on chepalexin    Pain Scale 0-10    Pain Score 0-No pain    Evaluation and Treatment Procedures Explained to Patient/Family Yes    Evaluation and Treatment Procedures agreed to    Wound Properties Date First Assessed: 05/12/24 Time First Assessed: 1020 Present on Original Admission: Yes Primary Wound Type: Atypical Secondary Wound Type - Atypical: -- , following removal of skin  cancers  Location: Pretibial Location Orientation: Right   Site / Wound Assessment Yellow    Peri-wound Assessment Edema    Wound Length (cm) --   now broken into 2 small areas, 1X0.6 and 0.5X0.5   Drainage Amount None    Treatments Cleansed;Other (Comment)   debridment   Dressing Type Compression wrap    Dressing Changed Changed    Dressing Status Clean    Wound Properties Date First Assessed: 05/12/24 Time First Assessed: 1030 Present on Original Admission: Yes Primary Wound Type: Atypical Location: Leg Location Orientation: Left   Site / Wound Assessment Dry;Friable    Peri-wound Assessment Edema    Wound Length (cm) 2.5 cm     Wound Width (cm) 1 cm    Wound Surface Area (cm^2) 1.96 cm^2    Drainage Amount None    Treatments Cleansed;Other (Comment)   debridement   Dressing Type Gauze (Comment)    Dressing Status Dry;Clean    Wound Therapy - Clinical Statement see below    Wound Therapy - Functional Problem List dressing and bathing    Factors Delaying/Impairing Wound Healing Infection - systemic/local;Vascular compromise    Hydrotherapy Plan Debridement;Dressing change;Electrical stimulation    Wound Therapy - Frequency 2X / week   for six weeks   Wound Therapy - Current Recommendations PT    Wound Plan debridement and dressing change    Dressing  Both LE recievedmedihoney to wound bed followed by 4x4 , profore lite compression system and netting             PATIENT EDUCATION: Education details: ankle pumps, keep dressing dry if dressing becomes painful remove and replace.  Person educated: Patient Education method: Explanation Education comprehension: verbalized understanding   HOME EXERCISE PROGRAM: Ankle pumps    GOALS: Goals reviewed with patient? No  SHORT TERM GOALS: Target date: 06/02/24  Wounds to be 100% granulated to decrease risk of infection  Baseline: Goal status: IN PROGRESS  2.  Pain in  legs to be no greater than 2/10 Baseline:  Goal status: IN PROGRESS  3.  Edema will no longer be visible in Rt LE  Baseline:  Goal status: IN PROGRESS   LONG TERM GOALS: Target date: 06/23/24  Wound to be healed  Baseline:  Goal status: IN PROGRESS  2.  PT to have no pain in her legs .  Baseline:  Goal status: IN PROGRESS  3.  Pt to have obtained new compression garments.  Baseline:  Goal status: IN PROGRESS    ASSESSMENT:  CLINICAL IMPRESSION: Dressing intact; comes today with granddaughter.  Debrided away devitalized tissue, mostly hardened scab perimeter of wounds.  Re measured with noted reduction.  Rt LE wound is now 2 small areas rather than one large wound.   Continued with medihoney to wounds following cleansing and moisturize using lotion.  Did not have time to complete manual lymph drainage as more time was spent on debridement of wounds.  [name] will continue to benefit from skilled PT to create and keep a healing environment for her wound.    OBJECTIVE IMPAIRMENTS: decreased activity tolerance, decreased mobility, difficulty walking, increased edema, pain, and decreased skin integrity.  ACTIVITY LIMITATIONS: carrying, lifting, standing, squatting, bathing, hygiene/grooming, and locomotion level  PARTICIPATION LIMITATIONS: community activity  PERSONAL FACTORS: Age, Fitness, and Time since onset of injury/illness/exacerbation are also affecting patient's functional outcome.   REHAB POTENTIAL: Good  CLINICAL DECISION MAKING: Evolving/moderate complexity  EVALUATION COMPLEXITY: Moderate  PLAN: PT FREQUENCY: 2x/week  PT DURATION: 6 weeks  PLANNED INTERVENTIONS: 97110-Therapeutic exercises, 97535- Self Care, 02859- Manual therapy, and 97597- Wound care (first 20 sq cm)  PLAN FOR NEXT SESSION: Continue with wound care.    Greig KATHEE Fuse, PTA/CLT [facility] Ph: [phone]  PT CLT  05/18/2024, 12:29 PM

## 2024-05-20 ENCOUNTER — Ambulatory Visit (HOSPITAL_COMMUNITY): Admitting: Physical Therapy

## 2024-05-20 DIAGNOSIS — M79661 Pain in right lower leg: Secondary | ICD-10-CM

## 2024-05-20 DIAGNOSIS — M79662 Pain in left lower leg: Secondary | ICD-10-CM

## 2024-05-20 DIAGNOSIS — S81801S Unspecified open wound, right lower leg, sequela: Secondary | ICD-10-CM

## 2024-05-20 DIAGNOSIS — S81802S Unspecified open wound, left lower leg, sequela: Secondary | ICD-10-CM | POA: Diagnosis not present

## 2024-05-20 DIAGNOSIS — R600 Localized edema: Secondary | ICD-10-CM | POA: Diagnosis not present

## 2024-05-20 NOTE — Therapy (Signed)
 OUTPATIENT PHYSICAL THERAPY Wound Treatment   Patient Name: Kaitlyn Sanchez MRN: [id number] DOB:06-26-1935, 89 y.o., female Today's Date: 05/20/2024   PCP: Kaitlyn Rush, MD REFERRING PROVIDER: Marvine Rush, MD  END OF SESSION:  PT End of Session - 05/20/24 1655     Visit Number 4    Number of Visits 12    Date for PT Re-Evaluation 06/23/24    Authorization Type [employer]: shara put in    Progress Note Due on Visit 10    PT Start Time 1351    PT Stop Time 1432    PT Time Calculation (min) 41 min    Activity Tolerance Patient tolerated treatment well    Behavior During Therapy [facility] for tasks assessed/performed         .wound  Past Medical History:  Diagnosis Date   Essential hypertension    GERD (gastroesophageal reflux disease)    History of cardiac catheterization 2013   Mild coronary atherosclerosis - [facility]   Hyperlipidemia    Hypertensive retinopathy    Hypothyroidism    Past Surgical History:  Procedure Laterality Date   APPENDECTOMY     BREAST CYST ASPIRATION Right    unsure when   CARPAL TUNNEL RELEASE     CATARACT EXTRACTION W/PHACO Left 12/28/2013   Procedure: CATARACT EXTRACTION PHACO AND INTRAOCULAR LENS PLACEMENT (IOC);  Surgeon: Kaitlyn Mania, MD;  Location: [facility];  Service: Ophthalmology;  Laterality: Left;  CDE 18.64   CATARACT EXTRACTION W/PHACO Right 01/07/2014   Procedure: CATARACT EXTRACTION PHACO AND INTRAOCULAR LENS PLACEMENT (IOC);  Surgeon: Kaitlyn Mania, MD;  Location: [facility];  Service: Ophthalmology;  Laterality: Right;  CDE:11.01   COLONOSCOPY     ESOPHAGOGASTRODUODENOSCOPY N/A 10/11/2014   RMR:non critical schatzkis ring/HH   EYE SURGERY     LUMBAR LAMINECTOMY     X 2   TONSILLECTOMY     Patient Active Problem List   Diagnosis Date Noted   Hypothyroidism following radioiodine therapy 05/11/2020   Hyponatremia--very symptomatic with seizures 03/17/2020   Essential hypertension    GERD (gastroesophageal reflux disease)     Hypothyroidism    Hyperlipidemia    Hypocalcemia    Seizure--in the setting of low sodium and Levaquin use    Constipation 08/19/2015   Abdominal pain 08/19/2015   Mucosal abnormality of esophagus    Dyspepsia 10/07/2014   Venous hum 11/23/2012   COPD (chronic obstructive pulmonary disease) (HCC) 10/30/2012   Dizziness 10/30/2012   Hypertensive emergency 06/07/2012   H N P-LUMBAR 09/01/2008   Displacement of lumbar intervertebral disc 09/01/2008   LOW BACK PAIN 01/28/2008   SCIATICA 01/28/2008    ONSET DATE: 02/17/24:  Approximate.   REFERRING DIAG: Right leg wound   THERAPY DIAG:  Right leg wound   Rationale for Evaluation and Treatment: Rehabilitation     Wound Therapy - 05/20/24 0001     Subjective Pt states that the bandages are comfortable    Patient and Family Stated Goals wounds to heal.    Date of Onset 02/17/24    Prior Treatments four antibiotics and self care.  Currently on chepalexin    Pain Scale 0-10    Pain Score 0-No pain    Evaluation and Treatment Procedures Explained to Patient/Family Yes    Evaluation and Treatment Procedures agreed to    Wound Properties Date First Assessed: 05/12/24 Time First Assessed: 1020 Present on Original Admission: Yes Primary Wound Type: Atypical Secondary Wound Type - Atypical: -- , following removal  of skin cancers  Location: Pretibial Location Orientation: Right   Site / Wound Assessment Yellow    Peri-wound Assessment Edema    Wound Length (cm) 2 cm    Wound Width (cm) 0.4 cm    Wound Surface Area (cm^2) 0.63 cm^2    Wound Depth (cm) 0.3 cm   with undermining of .2 cm   Wound Volume (cm^3) 0.126 cm^3    Drainage Amount None    Treatments Cleansed;Moisturizing cream;Site care;Other (Comment)   debridement of non viable tissue   Dressing Type Compression wrap    Dressing Status Clean    Wound Properties Date First Assessed: 05/12/24 Time First Assessed: 1030 Present on Original Admission: Yes Primary Wound Type:  Atypical Location: Leg Location Orientation: Left   Site / Wound Assessment Dry;Friable    Peri-wound Assessment Edema    Wound Length (cm) 2 cm    Wound Width (cm) 1 cm    Wound Surface Area (cm^2) 1.57 cm^2    Drainage Amount None    Treatments Cleansed;Moisturizing cream;Site care    Dressing Type Gauze (Comment)    Dressing Changed Changed    Dressing Status Dry;Clean    Wound Therapy - Clinical Statement see below    Wound Therapy - Functional Problem List dressing and bathing    Factors Delaying/Impairing Wound Healing Infection - systemic/local;Vascular compromise    Hydrotherapy Plan Debridement;Dressing change;Electrical stimulation    Wound Therapy - Frequency 2X / week   for six weeks   Wound Therapy - Current Recommendations PT    Wound Plan debridement and dressing change    Dressing  Both LE recievedmedihoney to wound bed followed by 4x4 , profore lite compression system and netting    Manual Therapy decongestive techniques completed to both LE             PATIENT EDUCATION: Education details: ankle pumps, keep dressing dry if dressing becomes painful remove and replace.  Person educated: Patient Education method: Explanation Education comprehension: verbalized understanding   HOME EXERCISE PROGRAM: Ankle pumps    GOALS: Goals reviewed with patient? No  SHORT TERM GOALS: Target date: 06/02/24  Wounds to be 100% granulated to decrease risk of infection  Baseline: Goal status: IN PROGRESS  2.  Pain in  legs to be no greater than 2/10 Baseline:  Goal status: MET  3.  Edema will no longer be visible in Rt LE  Baseline:  Goal status: MET   LONG TERM GOALS: Target date: 06/23/24  Wound to be healed  Baseline:  Goal status: IN PROGRESS  2.  PT to have no pain in her legs .  Baseline:  Goal status: MET  3.  Pt to have obtained new compression garments.  Baseline:  Goal status: IN PROGRESS    ASSESSMENT:  CLINICAL IMPRESSION: PT Rt LE  wound no longer has any black tissue, however therapist notes depth and undermining as this tissue is debrided.   Lt LE no longer has any yellow tissue and is almost healed.  Edema has decreased significantly since using compression bandage system.   [name] will continue to benefit from skilled PT to create and keep a healing environment for her wound.    OBJECTIVE IMPAIRMENTS: decreased activity tolerance, decreased mobility, difficulty walking, increased edema, pain, and decreased skin integrity.  ACTIVITY LIMITATIONS: carrying, lifting, standing, squatting, bathing, hygiene/grooming, and locomotion level  PARTICIPATION LIMITATIONS: community activity  PERSONAL FACTORS: Age, Fitness, and Time since onset of injury/illness/exacerbation are also affecting patient's  functional outcome.   REHAB POTENTIAL: Good  CLINICAL DECISION MAKING: Evolving/moderate complexity  EVALUATION COMPLEXITY: Moderate  PLAN: PT FREQUENCY: 2x/week  PT DURATION: 6 weeks  PLANNED INTERVENTIONS: 97110-Therapeutic exercises, 97535- Self Care, 02859- Manual therapy, and 97597- Wound care (first 20 sq cm)  PLAN FOR NEXT SESSION: Continue with wound care.   Montie Metro, PT CLT [phone]  [facility] Ph: [phone]  PT CLT  05/20/2024, 5:00 PM

## 2024-05-25 ENCOUNTER — Ambulatory Visit (HOSPITAL_COMMUNITY): Admitting: Physical Therapy

## 2024-05-25 DIAGNOSIS — M79662 Pain in left lower leg: Secondary | ICD-10-CM | POA: Diagnosis not present

## 2024-05-25 DIAGNOSIS — M79661 Pain in right lower leg: Secondary | ICD-10-CM | POA: Diagnosis not present

## 2024-05-25 DIAGNOSIS — S81802S Unspecified open wound, left lower leg, sequela: Secondary | ICD-10-CM

## 2024-05-25 DIAGNOSIS — R600 Localized edema: Secondary | ICD-10-CM | POA: Diagnosis not present

## 2024-05-25 DIAGNOSIS — Z6823 Body mass index (BMI) 23.0-23.9, adult: Secondary | ICD-10-CM | POA: Diagnosis not present

## 2024-05-25 DIAGNOSIS — E039 Hypothyroidism, unspecified: Secondary | ICD-10-CM | POA: Diagnosis not present

## 2024-05-25 DIAGNOSIS — S81801S Unspecified open wound, right lower leg, sequela: Secondary | ICD-10-CM

## 2024-05-25 NOTE — Therapy (Signed)
 OUTPATIENT PHYSICAL THERAPY Wound Treatment   Patient Name: Kaitlyn Sanchez MRN: [id number] DOB:April 28, 1935, 89 y.o., female Today's Date: 05/25/2024   PCP: Kaitlyn Rush, MD REFERRING PROVIDER: Marvine Rush, MD  END OF SESSION:  PT End of Session - 05/25/24 1118     Visit Number 5    Number of Visits 12    Date for PT Re-Evaluation 06/23/24    Authorization Type [employer]: shara put in    Progress Note Due on Visit 10    PT Start Time 0920    PT Stop Time 1001    PT Time Calculation (min) 41 min    Activity Tolerance Patient tolerated treatment well    Behavior During Therapy [facility] for tasks assessed/performed           Past Medical History:  Diagnosis Date   Essential hypertension    GERD (gastroesophageal reflux disease)    History of cardiac catheterization 2013   Mild coronary atherosclerosis - [facility]   Hyperlipidemia    Hypertensive retinopathy    Hypothyroidism    Past Surgical History:  Procedure Laterality Date   APPENDECTOMY     BREAST CYST ASPIRATION Right    unsure when   CARPAL TUNNEL RELEASE     CATARACT EXTRACTION W/PHACO Left 12/28/2013   Procedure: CATARACT EXTRACTION PHACO AND INTRAOCULAR LENS PLACEMENT (IOC);  Surgeon: Kaitlyn Mania, MD;  Location: [facility];  Service: Ophthalmology;  Laterality: Left;  CDE 18.64   CATARACT EXTRACTION W/PHACO Right 01/07/2014   Procedure: CATARACT EXTRACTION PHACO AND INTRAOCULAR LENS PLACEMENT (IOC);  Surgeon: Kaitlyn Mania, MD;  Location: [facility];  Service: Ophthalmology;  Laterality: Right;  CDE:11.01   COLONOSCOPY     ESOPHAGOGASTRODUODENOSCOPY N/A 10/11/2014   RMR:non critical schatzkis ring/HH   EYE SURGERY     LUMBAR LAMINECTOMY     X 2   TONSILLECTOMY     Patient Active Problem List   Diagnosis Date Noted   Hypothyroidism following radioiodine therapy 05/11/2020   Hyponatremia--very symptomatic with seizures 03/17/2020   Essential hypertension    GERD (gastroesophageal reflux disease)     Hypothyroidism    Hyperlipidemia    Hypocalcemia    Seizure--in the setting of low sodium and Levaquin use    Constipation 08/19/2015   Abdominal pain 08/19/2015   Mucosal abnormality of esophagus    Dyspepsia 10/07/2014   Venous hum 11/23/2012   COPD (chronic obstructive pulmonary disease) (HCC) 10/30/2012   Dizziness 10/30/2012   Hypertensive emergency 06/07/2012   H N P-LUMBAR 09/01/2008   Displacement of lumbar intervertebral disc 09/01/2008   LOW BACK PAIN 01/28/2008   SCIATICA 01/28/2008    ONSET DATE: 02/17/24:  Approximate.   REFERRING DIAG: Right leg wound   THERAPY DIAG:  Right leg wound   Rationale for Evaluation and Treatment: Rehabilitation     Wound Therapy - 05/25/24 0001     Subjective Pt has no complaints    Patient and Family Stated Goals wounds to heal.    Date of Onset 02/17/24    Prior Treatments four antibiotics and self care.  Currently on chepalexin    Pain Scale 0-10    Pain Score 0-No pain    Evaluation and Treatment Procedures Explained to Patient/Family Yes    Evaluation and Treatment Procedures agreed to    Wound Properties Date First Assessed: 05/12/24 Time First Assessed: 1020 Present on Original Admission: Yes Primary Wound Type: Atypical Secondary Wound Type - Atypical: -- , following removal of skin cancers  Location: Pretibial Location Orientation: Right   Site / Wound Assessment Yellow    Peri-wound Assessment Intact    Drainage Amount None    Treatments Cleansed;Moisturizing cream;Site care   debridement and dressing change.   Dressing Type Compression wrap    Dressing Status Clean    Wound Properties Date First Assessed: 05/12/24 Time First Assessed: 1030 Present on Original Admission: Yes Primary Wound Type: Atypical Location: Leg Location Orientation: Left   Site / Wound Assessment Dry;Friable    Peri-wound Assessment Edema    Wound Length (cm) 1.5 cm    Wound Width (cm) 0.6 cm    Wound Surface Area (cm^2) 0.71 cm^2    Drainage  Amount None    Treatments Cleansed;Moisturizing cream;Site care    Dressing Type Gauze (Comment)   debridement and dressing change   Dressing Status Dry;Clean    Wound Therapy - Clinical Statement see below    Wound Therapy - Functional Problem List dressing and bathing    Factors Delaying/Impairing Wound Healing Infection - systemic/local;Vascular compromise    Hydrotherapy Plan Debridement;Dressing change;Electrical stimulation    Wound Therapy - Frequency 2X / week   for six weeks   Wound Therapy - Current Recommendations PT    Wound Plan debridement and dressing change    Dressing  Both LE recievedmedihoney to wound bed followed by 4x4 , profore lite compression system and netting    Manual Therapy decongestive techniques completed to both LE              PATIENT EDUCATION: Education details: ankle pumps, keep dressing dry if dressing becomes painful remove and replace.  Person educated: Patient Education method: Explanation Education comprehension: verbalized understanding   HOME EXERCISE PROGRAM: Ankle pumps    GOALS: Goals reviewed with patient? No  SHORT TERM GOALS: Target date: 06/02/24  Wounds to be 100% granulated to decrease risk of infection  Baseline: Goal status: IN PROGRESS  2.  Pain in  legs to be no greater than 2/10 Baseline:  Goal status: MET  3.  Edema will no longer be visible in Rt LE  Baseline:  Goal status: MET   LONG TERM GOALS: Target date: 06/23/24  Wound to be healed  Baseline:  Goal status: IN PROGRESS  2.  PT to have no pain in her legs .  Baseline:  Goal status: MET  3.  Pt to have obtained new compression garments.  Baseline:  Goal status: IN PROGRESS    ASSESSMENT:  CLINICAL IMPRESSION: PT Rt LE wound is approximating well.   Lt LE should be healed and ready for compression garment next session.  Pt no longer has any edema.   Kaitlyn Sanchez will continue to benefit from skilled PT to create and keep a healing environment  for her wound.    OBJECTIVE IMPAIRMENTS: decreased activity tolerance, decreased mobility, difficulty walking, increased edema, pain, and decreased skin integrity.  ACTIVITY LIMITATIONS: carrying, lifting, standing, squatting, bathing, hygiene/grooming, and locomotion level  PARTICIPATION LIMITATIONS: community activity  PERSONAL FACTORS: Age, Fitness, and Time since onset of injury/illness/exacerbation are also affecting patient's functional outcome.   REHAB POTENTIAL: Good  CLINICAL DECISION MAKING: Evolving/moderate complexity  EVALUATION COMPLEXITY: Moderate  PLAN: PT FREQUENCY: 2x/week  PT DURATION: 6 weeks  PLANNED INTERVENTIONS: 97110-Therapeutic exercises, 97535- Self Care, 02859- Manual therapy, and 97597- Wound care (first 20 sq cm)  PLAN FOR NEXT SESSION: Continue with wound care.   Montie Metro, PT CLT [phone]  [facility]  Ph: [phone]  PT CLT  05/25/2024, 11:20 AM

## 2024-05-27 ENCOUNTER — Encounter (HOSPITAL_COMMUNITY): Payer: Self-pay

## 2024-05-27 ENCOUNTER — Ambulatory Visit (HOSPITAL_COMMUNITY)

## 2024-05-27 ENCOUNTER — Ambulatory Visit (HOSPITAL_COMMUNITY)
Admission: RE | Admit: 2024-05-27 | Discharge: 2024-05-27 | Disposition: A | Discharge status: Home / Self Care | Source: Ambulatory Visit | Attending: Family Medicine | Admitting: Family Medicine

## 2024-05-27 DIAGNOSIS — Z1231 Encounter for screening mammogram for malignant neoplasm of breast: Secondary | ICD-10-CM | POA: Diagnosis not present

## 2024-05-27 DIAGNOSIS — R600 Localized edema: Secondary | ICD-10-CM | POA: Diagnosis not present

## 2024-05-27 DIAGNOSIS — S81801S Unspecified open wound, right lower leg, sequela: Secondary | ICD-10-CM

## 2024-05-27 DIAGNOSIS — M79662 Pain in left lower leg: Secondary | ICD-10-CM

## 2024-05-27 DIAGNOSIS — M79661 Pain in right lower leg: Secondary | ICD-10-CM | POA: Diagnosis not present

## 2024-05-27 DIAGNOSIS — S81802S Unspecified open wound, left lower leg, sequela: Secondary | ICD-10-CM | POA: Diagnosis not present

## 2024-05-27 NOTE — Therapy (Signed)
 OUTPATIENT PHYSICAL THERAPY Wound Treatment   Patient Name: Kaitlyn Sanchez MRN: [id number] DOB:03/10/1935, 89 y.o., female Today's Date: 05/27/2024   PCP: Marvine Rush, MD REFERRING PROVIDER: Marvine Rush, MD  END OF SESSION:  PT End of Session - 05/27/24 9071     Visit Number 6    Number of Visits 12    Date for PT Re-Evaluation 06/23/24    Authorization Type [employer]: shara put in    Progress Note Due on Visit 10    PT Start Time 0930    PT Stop Time 1015    PT Time Calculation (min) 45 min    Activity Tolerance Patient tolerated treatment well    Behavior During Therapy [facility] for tasks assessed/performed           Past Medical History:  Diagnosis Date   Essential hypertension    GERD (gastroesophageal reflux disease)    History of cardiac catheterization 2013   Mild coronary atherosclerosis - [facility]   Hyperlipidemia    Hypertensive retinopathy    Hypothyroidism    Past Surgical History:  Procedure Laterality Date   APPENDECTOMY     BREAST CYST ASPIRATION Right    unsure when   CARPAL TUNNEL RELEASE     CATARACT EXTRACTION W/PHACO Left 12/28/2013   Procedure: CATARACT EXTRACTION PHACO AND INTRAOCULAR LENS PLACEMENT (IOC);  Surgeon: Cherene Mania, MD;  Location: [facility];  Service: Ophthalmology;  Laterality: Left;  CDE 18.64   CATARACT EXTRACTION W/PHACO Right 01/07/2014   Procedure: CATARACT EXTRACTION PHACO AND INTRAOCULAR LENS PLACEMENT (IOC);  Surgeon: Cherene Mania, MD;  Location: [facility];  Service: Ophthalmology;  Laterality: Right;  CDE:11.01   COLONOSCOPY     ESOPHAGOGASTRODUODENOSCOPY N/A 10/11/2014   RMR:non critical schatzkis ring/HH   EYE SURGERY     LUMBAR LAMINECTOMY     X 2   TONSILLECTOMY     Patient Active Problem List   Diagnosis Date Noted   Hypothyroidism following radioiodine therapy 05/11/2020   Hyponatremia--very symptomatic with seizures 03/17/2020   Essential hypertension    GERD (gastroesophageal reflux disease)     Hypothyroidism    Hyperlipidemia    Hypocalcemia    Seizure--in the setting of low sodium and Levaquin use    Constipation 08/19/2015   Abdominal pain 08/19/2015   Mucosal abnormality of esophagus    Dyspepsia 10/07/2014   Venous hum 11/23/2012   COPD (chronic obstructive pulmonary disease) (HCC) 10/30/2012   Dizziness 10/30/2012   Hypertensive emergency 06/07/2012   H N P-LUMBAR 09/01/2008   Displacement of lumbar intervertebral disc 09/01/2008   LOW BACK PAIN 01/28/2008   SCIATICA 01/28/2008    ONSET DATE: 02/17/24:  Approximate.   REFERRING DIAG: Right leg wound   THERAPY DIAG:  Right leg wound   Rationale for Evaluation and Treatment: Rehabilitation     Wound Therapy - 05/27/24 0001     Subjective Pt arrived with dressings intact, no reports of pain, does have the occasional twing.    Patient and Family Stated Goals wounds to heal.    Date of Onset 02/17/24    Prior Treatments four antibiotics and self care.  Currently on chepalexin    Pain Scale 0-10    Pain Score 0-No pain    Evaluation and Treatment Procedures Explained to Patient/Family Yes    Evaluation and Treatment Procedures agreed to    Wound Properties Date First Assessed: 05/12/24 Time First Assessed: 1020 Present on Original Admission: Yes Primary Wound Type: Atypical Secondary Wound  Type - Atypical: -- , following removal of skin cancers  Location: Pretibial Location Orientation: Right   Wound Image Images linked: 1    Site / Wound Assessment Yellow;Dry    Peri-wound Assessment Intact;Edema    Drainage Amount None    Treatments Cleansed;Moisturizing cream   manual, debridement and dressing change   Dressing Type Compression wrap   lotion, vaseline, medihoney, 2x2, profore lite   Dressing Changed Changed    Dressing Status Old drainage    Wound Properties Date First Assessed: 05/12/24 Time First Assessed: 1030 Present on Original Admission: Yes Primary Wound Type: Atypical Location: Leg Location  Orientation: Left   Wound Image Images linked: 2    Site / Wound Assessment Dry;Friable    Peri-wound Assessment Edema    Drainage Amount None    Treatments Cleansed;Moisturizing cream   manual, debridement and compression garments   Dressing Type Gauze (Comment);Compression wrap   Pt donned compression garment on Lt   Dressing Status Dry;Clean    Wound Therapy - Clinical Statement see below    Wound Therapy - Functional Problem List dressing and bathing    Factors Delaying/Impairing Wound Healing Infection - systemic/local;Vascular compromise    Hydrotherapy Plan Debridement;Dressing change;Electrical stimulation    Wound Therapy - Frequency 2X / week    Wound Therapy - Current Recommendations PT    Wound Plan debridement and dressing change    Dressing  Rt LE: lotion/vaseline, medihoney, 2x2, profore lite.  Lt gauze around wound then pt donned her compression garments    Manual Therapy decongestive techniques completed to both LE    Modality Measurements taken and given ETI handout, instructed to order surgical quality              PATIENT EDUCATION: Education details: ankle pumps, keep dressing dry if dressing becomes painful remove and replace.  Person educated: Patient Education method: Explanation Education comprehension: verbalized understanding   HOME EXERCISE PROGRAM: Ankle pumps    GOALS: Goals reviewed with patient? No  SHORT TERM GOALS: Target date: 06/02/24  Wounds to be 100% granulated to decrease risk of infection  Baseline: Goal status: IN PROGRESS  2.  Pain in  legs to be no greater than 2/10 Baseline:  Goal status: MET  3.  Edema will no longer be visible in Rt LE  Baseline:  Goal status: MET   LONG TERM GOALS: Target date: 06/23/24  Wound to be healed  Baseline:  Goal status: IN PROGRESS  2.  PT to have no pain in her legs .  Baseline:  Goal status: MET  3.  Pt to have obtained new compression garments.  Baseline:  Goal status: IN  PROGRESS    ASSESSMENT:  CLINICAL IMPRESSION: Selective debridement for removal of devitalized tissue to promote healing on Rt LE only.  Lt LE is healed.  Pt brought in compression garments this session.  Reports of pain when donning over Lt LE wounds, added gauze with reports of increased comfort.  Pt instructed to order surgical quality compression garments in the future.  Measurements taken and given ETI handout.  OBJECTIVE IMPAIRMENTS: decreased activity tolerance, decreased mobility, difficulty walking, increased edema, pain, and decreased skin integrity.  ACTIVITY LIMITATIONS: carrying, lifting, standing, squatting, bathing, hygiene/grooming, and locomotion level  PARTICIPATION LIMITATIONS: community activity  PERSONAL FACTORS: Age, Fitness, and Time since onset of injury/illness/exacerbation are also affecting patient's functional outcome.   REHAB POTENTIAL: Good  CLINICAL DECISION MAKING: Evolving/moderate complexity  EVALUATION COMPLEXITY: Moderate  PLAN: PT FREQUENCY:  2x/week  PT DURATION: 6 weeks  PLANNED INTERVENTIONS: 97110-Therapeutic exercises, 97535- Self Care, 02859- Manual therapy, and 97597- Wound care (first 20 sq cm)  PLAN FOR NEXT SESSION: Continue with wound care.    Augustin Mclean, LPTA/CLT; [facility] [phone]   05/27/2024, 9:29 AM

## 2024-06-01 ENCOUNTER — Ambulatory Visit (HOSPITAL_COMMUNITY): Admitting: Physical Therapy

## 2024-06-01 DIAGNOSIS — S81801S Unspecified open wound, right lower leg, sequela: Secondary | ICD-10-CM

## 2024-06-01 DIAGNOSIS — M79661 Pain in right lower leg: Secondary | ICD-10-CM | POA: Diagnosis not present

## 2024-06-01 DIAGNOSIS — S81802S Unspecified open wound, left lower leg, sequela: Secondary | ICD-10-CM | POA: Diagnosis not present

## 2024-06-01 DIAGNOSIS — M79662 Pain in left lower leg: Secondary | ICD-10-CM | POA: Diagnosis not present

## 2024-06-01 DIAGNOSIS — R600 Localized edema: Secondary | ICD-10-CM | POA: Diagnosis not present

## 2024-06-01 NOTE — Therapy (Signed)
 OUTPATIENT PHYSICAL THERAPY Wound Treatment   Patient Name: Kaitlyn Sanchez MRN: [id number] DOB:06/17/1935, 89 y.o., female Today's Date: 06/01/2024   PCP: Marvine Rush, MD REFERRING PROVIDER: Marvine Rush, MD  END OF SESSION:  PT End of Session - 06/01/24 1459     Visit Number 7    Number of Visits 12    Date for PT Re-Evaluation 06/23/24    Authorization Type [employer]: shara put in    Progress Note Due on Visit 10    PT Start Time 1400    PT Stop Time 1440    PT Time Calculation (min) 40 min    Activity Tolerance Patient tolerated treatment well    Behavior During Therapy [facility] for tasks assessed/performed           Past Medical History:  Diagnosis Date   Essential hypertension    GERD (gastroesophageal reflux disease)    History of cardiac catheterization 2013   Mild coronary atherosclerosis - [facility]   Hyperlipidemia    Hypertensive retinopathy    Hypothyroidism    Past Surgical History:  Procedure Laterality Date   APPENDECTOMY     BREAST CYST ASPIRATION Right    unsure when   CARPAL TUNNEL RELEASE     CATARACT EXTRACTION W/PHACO Left 12/28/2013   Procedure: CATARACT EXTRACTION PHACO AND INTRAOCULAR LENS PLACEMENT (IOC);  Surgeon: Cherene Mania, MD;  Location: [facility];  Service: Ophthalmology;  Laterality: Left;  CDE 18.64   CATARACT EXTRACTION W/PHACO Right 01/07/2014   Procedure: CATARACT EXTRACTION PHACO AND INTRAOCULAR LENS PLACEMENT (IOC);  Surgeon: Cherene Mania, MD;  Location: [facility];  Service: Ophthalmology;  Laterality: Right;  CDE:11.01   COLONOSCOPY     ESOPHAGOGASTRODUODENOSCOPY N/A 10/11/2014   RMR:non critical schatzkis ring/HH   EYE SURGERY     LUMBAR LAMINECTOMY     X 2   TONSILLECTOMY     Patient Active Problem List   Diagnosis Date Noted   Hypothyroidism following radioiodine therapy 05/11/2020   Hyponatremia--very symptomatic with seizures 03/17/2020   Essential hypertension    GERD (gastroesophageal reflux disease)     Hypothyroidism    Hyperlipidemia    Hypocalcemia    Seizure--in the setting of low sodium and Levaquin use    Constipation 08/19/2015   Abdominal pain 08/19/2015   Mucosal abnormality of esophagus    Dyspepsia 10/07/2014   Venous hum 11/23/2012   COPD (chronic obstructive pulmonary disease) (HCC) 10/30/2012   Dizziness 10/30/2012   Hypertensive emergency 06/07/2012   H N P-LUMBAR 09/01/2008   Displacement of lumbar intervertebral disc 09/01/2008   LOW BACK PAIN 01/28/2008   SCIATICA 01/28/2008    ONSET DATE: 02/17/24:  Approximate.   REFERRING DIAG: Right leg wound   THERAPY DIAG:  Right leg wound   Rationale for Evaluation and Treatment: Rehabilitation     Wound Therapy - 06/01/24 1500     Subjective dressings intact, no issues.    Patient and Family Stated Goals wounds to heal.    Date of Onset 02/17/24    Prior Treatments four antibiotics and self care.  Currently on chepalexin    Pain Scale 0-10    Pain Score 0-No pain    Evaluation and Treatment Procedures Explained to Patient/Family Yes    Evaluation and Treatment Procedures agreed to    Wound Properties Date First Assessed: 05/12/24 Time First Assessed: 1020 Present on Original Admission: Yes Primary Wound Type: Atypical Secondary Wound Type - Atypical: -- , following removal of skin cancers  Location: Pretibial Location Orientation: Right   Site / Wound Assessment Yellow;Dry    Peri-wound Assessment Intact;Edema    Drainage Amount None    Treatments Cleansed;Moisturizing cream   debridement   Dressing Type Compression wrap   lotion, vaseline, medihoney, 2x2, profore lite   Dressing Changed Changed    Dressing Status Old drainage    Wound Properties Date First Assessed: 05/12/24 Time First Assessed: 1030 Present on Original Admission: Yes Primary Wound Type: Atypical Location: Leg Location Orientation: Left   Site / Wound Assessment Dry;Friable    Peri-wound Assessment Edema    Drainage Amount None     Treatments Cleansed    Dressing Type Gauze (Comment);Compression wrap   Pt donned compression garment on Lt   Dressing Changed Changed    Dressing Status Dry;Clean    Wound Therapy - Clinical Statement see below    Wound Therapy - Functional Problem List dressing and bathing    Factors Delaying/Impairing Wound Healing Infection - systemic/local;Vascular compromise    Hydrotherapy Plan Debridement;Dressing change;Electrical stimulation    Wound Therapy - Frequency 2X / week    Wound Therapy - Current Recommendations PT    Wound Plan debridement and dressing change    Dressing  Rt LE: lotion/vaseline, xeroform, 2x2, profore lite.    Dressing Lt LE:  vaseline perimeter and xeroform over patches, 2X2, conform then pt donned her compression garment              PATIENT EDUCATION: Education details: ankle pumps, keep dressing dry if dressing becomes painful remove and replace.  Person educated: Patient Education method: Explanation Education comprehension: verbalized understanding   HOME EXERCISE PROGRAM: Ankle pumps    GOALS: Goals reviewed with patient? No  SHORT TERM GOALS: Target date: 06/02/24  Wounds to be 100% granulated to decrease risk of infection  Baseline: Goal status: IN PROGRESS  2.  Pain in  legs to be no greater than 2/10 Baseline:  Goal status: MET  3.  Edema will no longer be visible in Rt LE  Baseline:  Goal status: MET   LONG TERM GOALS: Target date: 06/23/24  Wound to be healed  Baseline:  Goal status: IN PROGRESS  2.  PT to have no pain in her legs .  Baseline:  Goal status: MET  3.  Pt to have obtained new compression garments.  Baseline:  Goal status: IN PROGRESS    ASSESSMENT:  CLINICAL IMPRESSION: Pt with dry patches that are itchy Lt LE.  Debrided dry skin, moisturized and then placed xeroform over these and conform prior to donning of compression garment.  Rt LE wound debrided with slow improvement noted.  Also changed this  dressing to xeroform as the tissue was very dry and scaly. Pt tolerated debridment well. Instructed to moisturize her Lt LE well when she removes her compression at night.  Pt verbalized understanding.    OBJECTIVE IMPAIRMENTS: decreased activity tolerance, decreased mobility, difficulty walking, increased edema, pain, and decreased skin integrity.  ACTIVITY LIMITATIONS: carrying, lifting, standing, squatting, bathing, hygiene/grooming, and locomotion level  PARTICIPATION LIMITATIONS: community activity  PERSONAL FACTORS: Age, Fitness, and Time since onset of injury/illness/exacerbation are also affecting patient's functional outcome.   REHAB POTENTIAL: Good  CLINICAL DECISION MAKING: Evolving/moderate complexity  EVALUATION COMPLEXITY: Moderate  PLAN: PT FREQUENCY: 2x/week  PT DURATION: 6 weeks  PLANNED INTERVENTIONS: 97110-Therapeutic exercises, 97535- Self Care, 02859- Manual therapy, and 97597- Wound care (first 20 sq cm)  PLAN FOR NEXT SESSION: Continue with wound care.  Greig KATHEE Fuse, PTA/CLT [facility] Ph: [phone]  06/01/2024, 3:04 PM

## 2024-06-04 ENCOUNTER — Ambulatory Visit (HOSPITAL_COMMUNITY): Admitting: Physical Therapy

## 2024-06-04 DIAGNOSIS — S81802S Unspecified open wound, left lower leg, sequela: Secondary | ICD-10-CM

## 2024-06-04 DIAGNOSIS — R600 Localized edema: Secondary | ICD-10-CM

## 2024-06-04 DIAGNOSIS — M79661 Pain in right lower leg: Secondary | ICD-10-CM

## 2024-06-04 DIAGNOSIS — S81801S Unspecified open wound, right lower leg, sequela: Secondary | ICD-10-CM

## 2024-06-04 DIAGNOSIS — M79662 Pain in left lower leg: Secondary | ICD-10-CM

## 2024-06-04 NOTE — Therapy (Signed)
 OUTPATIENT PHYSICAL THERAPY Wound Treatment   Patient Name: Kaitlyn Sanchez MRN: [id number] DOB:11-23-1934, 89 y.o., female Today's Date: 06/04/2024   PCP: Marvine Rush, MD REFERRING PROVIDER: Marvine Rush, MD  END OF SESSION:  PT End of Session - 06/04/24 1114     Visit Number 8    Number of Visits 12    Date for PT Re-Evaluation 06/23/24    Authorization Type 12 visits approved from 05/14/24 thru 06/25/24    Authorization - Visit Number 8    Authorization - Number of Visits 12    Progress Note Due on Visit 10    PT Start Time 1010    PT Stop Time 1100    PT Time Calculation (min) 50 min    Activity Tolerance Patient tolerated treatment well    Behavior During Therapy [facility] for tasks assessed/performed            Past Medical History:  Diagnosis Date   Essential hypertension    GERD (gastroesophageal reflux disease)    History of cardiac catheterization 2013   Mild coronary atherosclerosis - [facility]   Hyperlipidemia    Hypertensive retinopathy    Hypothyroidism    Past Surgical History:  Procedure Laterality Date   APPENDECTOMY     BREAST CYST ASPIRATION Right    unsure when   CARPAL TUNNEL RELEASE     CATARACT EXTRACTION W/PHACO Left 12/28/2013   Procedure: CATARACT EXTRACTION PHACO AND INTRAOCULAR LENS PLACEMENT (IOC);  Surgeon: Cherene Mania, MD;  Location: [facility];  Service: Ophthalmology;  Laterality: Left;  CDE 18.64   CATARACT EXTRACTION W/PHACO Right 01/07/2014   Procedure: CATARACT EXTRACTION PHACO AND INTRAOCULAR LENS PLACEMENT (IOC);  Surgeon: Cherene Mania, MD;  Location: [facility];  Service: Ophthalmology;  Laterality: Right;  CDE:11.01   COLONOSCOPY     ESOPHAGOGASTRODUODENOSCOPY N/A 10/11/2014   RMR:non critical schatzkis ring/HH   EYE SURGERY     LUMBAR LAMINECTOMY     X 2   TONSILLECTOMY     Patient Active Problem List   Diagnosis Date Noted   Hypothyroidism following radioiodine therapy 05/11/2020   Hyponatremia--very symptomatic with seizures  03/17/2020   Essential hypertension    GERD (gastroesophageal reflux disease)    Hypothyroidism    Hyperlipidemia    Hypocalcemia    Seizure--in the setting of low sodium and Levaquin use    Constipation 08/19/2015   Abdominal pain 08/19/2015   Mucosal abnormality of esophagus    Dyspepsia 10/07/2014   Venous hum 11/23/2012   COPD (chronic obstructive pulmonary disease) (HCC) 10/30/2012   Dizziness 10/30/2012   Hypertensive emergency 06/07/2012   H N P-LUMBAR 09/01/2008   Displacement of lumbar intervertebral disc 09/01/2008   LOW BACK PAIN 01/28/2008   SCIATICA 01/28/2008    ONSET DATE: 02/17/24:  Approximate.   REFERRING DIAG: Right leg wound   THERAPY DIAG:  Right leg wound   Rationale for Evaluation and Treatment: Rehabilitation     Wound Therapy - 06/04/24 0001     Subjective Pt states wounds are not painful unless someone places pressure on them.    Patient and Family Stated Goals wounds to heal.    Date of Onset 02/17/24    Prior Treatments four antibiotics and self care.  Currently on chepalexin    Pain Scale 0-10    Pain Score 5    with debridement; 0 without   Pain Type Chronic pain    Pain Location Leg    Pain Orientation Right;Left  Pain Descriptors / Indicators Sore    Patients Stated Pain Goal 0    Pain Intervention(s) Emotional support    Evaluation and Treatment Procedures Explained to Patient/Family Yes    Evaluation and Treatment Procedures agreed to    Wound Properties Date First Assessed: 05/12/24 Time First Assessed: 1020 Present on Original Admission: Yes Primary Wound Type: Atypical Secondary Wound Type - Atypical: -- , following removal of skin cancers  Location: Pretibial Location Orientation: Right   Wound Image Images linked: 1    Site / Wound Assessment Yellow;Dry    Peri-wound Assessment Intact;Edema    Wound Length (cm) 1 cm    Wound Width (cm) 0.4 cm    Wound Surface Area (cm^2) 0.31 cm^2    Wound Depth (cm) 0.3 cm    Wound Volume  (cm^3) 0.063 cm^3    Drainage Amount None    Treatments Cleansed;Site care;Other (Comment)    Dressing Type Compression wrap   lotion, vaseline, medihoney, 2x2, profore lite   Dressing Changed Changed    Dressing Status Clean;Dry    Wound Properties Date First Assessed: 05/12/24 Time First Assessed: 1030 Present on Original Admission: Yes Primary Wound Type: Atypical Location: Leg Location Orientation: Left   Wound Image --   posterior aspect of left leg   Site / Wound Assessment Dry;Friable    Peri-wound Assessment Edema    Wound Length (cm) 0.3 cm   medial has healed this is the posterior wound that did open up.  Original raised area was 2.5 x 2. there is an open area superior to this .2x.2x.3 cm   Wound Width (cm) 0.3 cm    Wound Surface Area (cm^2) 0.07 cm^2    Wound Depth (cm) 0.4 cm    Wound Volume (cm^3) 0.019 cm^3    Drainage Amount None    Treatments Cleansed;Site care;Moisturizing cream;Other (Comment)   debridement of area.   Dressing Type Gauze (Comment);Compression wrap   Pt donned compression garment on Lt   Dressing Changed Changed    Dressing Status Dry;Clean    Wound Therapy - Clinical Statement see below    Wound Therapy - Functional Problem List dressing and bathing    Factors Delaying/Impairing Wound Healing Infection - systemic/local;Vascular compromise    Hydrotherapy Plan Debridement;Dressing change;Electrical stimulation    Wound Therapy - Frequency 2X / week   decrease to one time a week at this time.   Wound Therapy - Current Recommendations PT    Wound Plan debridement and dressing change    Dressing  Rt LE:  honey impregnated iodoform ,2x2, lotion to entire leg followed by profore lit.    Dressing LT:  honey impregnated iodoform to opening, lotion to LE 4x4 and medipore tape with compression garment donned.    Manual Therapy decongestive techniques completed to both LE               PATIENT EDUCATION: Education details: ankle pumps, keep dressing  dry if dressing becomes painful remove and replace.  Person educated: Patient Education method: Explanation Education comprehension: verbalized understanding   HOME EXERCISE PROGRAM: Ankle pumps    GOALS: Goals reviewed with patient? No  SHORT TERM GOALS: Target date: 06/02/24  Wounds to be 100% granulated to decrease risk of infection  Baseline: Goal status: IN PROGRESS  2.  Pain in  legs to be no greater than 2/10 Baseline:  Goal status: MET  3.  Edema will no longer be visible in Rt LE  Baseline:  Goal status:  MET   LONG TERM GOALS: Target date: 06/23/24  Wound to be healed  Baseline:  Goal status: IN PROGRESS  2.  PT to have no pain in her legs .  Baseline:  Goal status: MET  3.  Pt to have obtained new compression garments.  Baseline:  Goal status: IN PROGRESS    ASSESSMENT:  CLINICAL IMPRESSION: PT raised area on posterior aspect of her Lt LE has opened up with two small wounds. The most inferior is the largest but is still small, (0.3x0.3 cm), however, there is depth of .4cm and the wound undermines in all direction.  The more superior wound also has appreciable depth.  Pt Rt wound is improving.  PT will continue to benefit from skilled PT but is able to be decreased to once a week.      OBJECTIVE IMPAIRMENTS: decreased activity tolerance, decreased mobility, difficulty walking, increased edema, pain, and decreased skin integrity.  ACTIVITY LIMITATIONS: carrying, lifting, standing, squatting, bathing, hygiene/grooming, and locomotion level  PARTICIPATION LIMITATIONS: community activity  PERSONAL FACTORS: Age, Fitness, and Time since onset of injury/illness/exacerbation are also affecting patient's functional outcome.   REHAB POTENTIAL: Good  CLINICAL DECISION MAKING: Evolving/moderate complexity  EVALUATION COMPLEXITY: Moderate  PLAN: PT FREQUENCY: 2x/week  PT DURATION: 6 weeks  PLANNED INTERVENTIONS: 97110-Therapeutic exercises, 97535- Self  Care, 02859- Manual therapy, and 97597- Wound care (first 20 sq cm)  PLAN FOR NEXT SESSION: Continue with wound care.    Montie Metro, PT CLT [phone]  [facility] Outpatient Rehabilitation [facility] Ph: [phone]  06/04/2024, 11:16 AM

## 2024-06-09 ENCOUNTER — Ambulatory Visit (HOSPITAL_COMMUNITY): Attending: Family Medicine | Admitting: Physical Therapy

## 2024-06-09 DIAGNOSIS — M79661 Pain in right lower leg: Secondary | ICD-10-CM | POA: Insufficient documentation

## 2024-06-09 DIAGNOSIS — S81801S Unspecified open wound, right lower leg, sequela: Secondary | ICD-10-CM | POA: Diagnosis not present

## 2024-06-09 DIAGNOSIS — R600 Localized edema: Secondary | ICD-10-CM | POA: Diagnosis not present

## 2024-06-09 DIAGNOSIS — S81802S Unspecified open wound, left lower leg, sequela: Secondary | ICD-10-CM | POA: Diagnosis not present

## 2024-06-09 DIAGNOSIS — M79662 Pain in left lower leg: Secondary | ICD-10-CM | POA: Insufficient documentation

## 2024-06-09 NOTE — Therapy (Signed)
 OUTPATIENT PHYSICAL THERAPY Wound Treatment   Patient Name: Kaitlyn Sanchez MRN: [id number] DOB:1935-03-29, 89 y.o., female Today's Date: 06/09/2024   PCP: Marvine Rush, MD REFERRING PROVIDER: Marvine Rush, MD  END OF SESSION:  PT End of Session - 06/09/24 0923     Visit Number 9    Number of Visits 12    Date for PT Re-Evaluation 06/23/24    Authorization Type 12 visits approved from 05/14/24 thru 06/25/24    Authorization - Visit Number 9    Authorization - Number of Visits 12    Progress Note Due on Visit 10    PT Start Time 0823    PT Stop Time 0912    PT Time Calculation (min) 49 min    Activity Tolerance Patient tolerated treatment well    Behavior During Therapy [facility] for tasks assessed/performed            Past Medical History:  Diagnosis Date   Essential hypertension    GERD (gastroesophageal reflux disease)    History of cardiac catheterization 2013   Mild coronary atherosclerosis - [facility]   Hyperlipidemia    Hypertensive retinopathy    Hypothyroidism    Past Surgical History:  Procedure Laterality Date   APPENDECTOMY     BREAST CYST ASPIRATION Right    unsure when   CARPAL TUNNEL RELEASE     CATARACT EXTRACTION W/PHACO Left 12/28/2013   Procedure: CATARACT EXTRACTION PHACO AND INTRAOCULAR LENS PLACEMENT (IOC);  Surgeon: Cherene Mania, MD;  Location: [facility];  Service: Ophthalmology;  Laterality: Left;  CDE 18.64   CATARACT EXTRACTION W/PHACO Right 01/07/2014   Procedure: CATARACT EXTRACTION PHACO AND INTRAOCULAR LENS PLACEMENT (IOC);  Surgeon: Cherene Mania, MD;  Location: [facility];  Service: Ophthalmology;  Laterality: Right;  CDE:11.01   COLONOSCOPY     ESOPHAGOGASTRODUODENOSCOPY N/A 10/11/2014   RMR:non critical schatzkis ring/HH   EYE SURGERY     LUMBAR LAMINECTOMY     X 2   TONSILLECTOMY     Patient Active Problem List   Diagnosis Date Noted   Hypothyroidism following radioiodine therapy 05/11/2020   Hyponatremia--very symptomatic with seizures  03/17/2020   Essential hypertension    GERD (gastroesophageal reflux disease)    Hypothyroidism    Hyperlipidemia    Hypocalcemia    Seizure--in the setting of low sodium and Levaquin use    Constipation 08/19/2015   Abdominal pain 08/19/2015   Mucosal abnormality of esophagus    Dyspepsia 10/07/2014   Venous hum 11/23/2012   COPD (chronic obstructive pulmonary disease) (HCC) 10/30/2012   Dizziness 10/30/2012   Hypertensive emergency 06/07/2012   H N P-LUMBAR 09/01/2008   Displacement of lumbar intervertebral disc 09/01/2008   LOW BACK PAIN 01/28/2008   SCIATICA 01/28/2008    ONSET DATE: 02/17/24:  Approximate.   REFERRING DIAG: Right leg wound   THERAPY DIAG:  Right leg wound   Rationale for Evaluation and Treatment: Rehabilitation     Wound Therapy - 06/09/24 0001     Subjective PT states that her LT leg is not as sore as last week.  Noticed that the Rt foot is slightly more swollen.    Patient and Family Stated Goals wounds to heal.    Date of Onset 02/17/24    Prior Treatments four antibiotics and self care.  Currently on chepalexin    Pain Scale 0-10    Pain Score 2   Pain Type Acute pain    Pain Location Leg    Pain Orientation  Right;Left    Pain Intervention(s) Emotional support    Evaluation and Treatment Procedures Explained to Patient/Family Yes    Evaluation and Treatment Procedures agreed to    Wound Properties Date First Assessed: 05/12/24 Time First Assessed: 1020 Present on Original Admission: Yes Primary Wound Type: Atypical Secondary Wound Type - Atypical: -- , following removal of skin cancers  Location: Pretibial Location Orientation: Right   Site / Wound Assessment Dry;Dusky;Friable;Painful    Peri-wound Assessment Intact;Edema    Drainage Amount None    Treatments Cleansed;Moisturizing cream;Site care;Other (Comment)    Dressing Type Compression wrap   lotion, vaseline, medihoney, 2x2, profore lite   Dressing Changed Changed    Dressing Status  Clean;Dry    Wound Properties Date First Assessed: 05/12/24 Time First Assessed: 1030 Present on Original Admission: Yes Primary Wound Type: Atypical Location: Leg Location Orientation: Left   Site / Wound Assessment Dry;Friable;Dusky    Drainage Amount None    Treatments Cleansed;Moisturizing cream;Site care   debrided   Dressing Type Gauze (Comment);Compression wrap   Pt donned compression garment on Lt   Dressing Changed Changed    Dressing Status Dry;Clean    Wound Therapy - Clinical Statement see below    Wound Therapy - Functional Problem List dressing and bathing    Factors Delaying/Impairing Wound Healing Infection - systemic/local;Vascular compromise    Hydrotherapy Plan Debridement;Dressing change;Electrical stimulation    Wound Therapy - Frequency 2X / week   decrease to one time a week at this time.   Wound Therapy - Current Recommendations PT    Wound Plan debridement and dressing change    Dressing  Rt LE:  honey impregnated iodoform ,2x2, lotion to entire leg followed by medipore tape.    Dressing LT:  honey impregnated iodoform to opening, lotion to LE 4x4 and medipore tape with compression garment donned.    Manual Therapy decongestive techniques completed to both LE               PATIENT EDUCATION: Education details: ankle pumps, keep dressing dry if dressing becomes painful remove and replace.  Person educated: Patient Education method: Explanation Education comprehension: verbalized understanding   HOME EXERCISE PROGRAM: Ankle pumps    GOALS: Goals reviewed with patient? No  SHORT TERM GOALS: Target date: 06/02/24  Wounds to be 100% granulated to decrease risk of infection  Baseline: Goal status: IN PROGRESS  2.  Pain in  legs to be no greater than 2/10 Baseline:  Goal status: MET  3.  Edema will no longer be visible in Rt LE  Baseline:  Goal status: MET   LONG TERM GOALS: Target date: 06/23/24  Wound to be healed  Baseline:  Goal status:  IN PROGRESS  2.  PT to have no pain in her legs .  Baseline:  Goal status: MET  3.  Pt to have obtained new compression garments.  Baseline:  Goal status: IN PROGRESS    ASSESSMENT:  CLINICAL IMPRESSION: PT wounds on B LE have improved in depth and undermining.  PT wounds are hyper-granulated therefore therapist placed small pieces of foam over wounds prior to dressing.   OBJECTIVE IMPAIRMENTS: decreased activity tolerance, decreased mobility, difficulty walking, increased edema, pain, and decreased skin integrity.  ACTIVITY LIMITATIONS: carrying, lifting, standing, squatting, bathing, hygiene/grooming, and locomotion level  PARTICIPATION LIMITATIONS: community activity  PERSONAL FACTORS: Age, Fitness, and Time since onset of injury/illness/exacerbation are also affecting patient's functional outcome.   REHAB POTENTIAL: Good  CLINICAL DECISION MAKING: Evolving/moderate  complexity  EVALUATION COMPLEXITY: Moderate  PLAN: PT FREQUENCY: 2x/week  PT DURATION: 6 weeks  PLANNED INTERVENTIONS: 97110-Therapeutic exercises, 97535- Self Care, 02859- Manual therapy, and 97597- Wound care (first 20 sq cm)  PLAN FOR NEXT SESSION: Complete 10th progress note.  Continue with wound care.    Montie Metro, PT CLT [phone]  [facility] Outpatient Rehabilitation [facility] Ph: [phone]  06/09/2024, 9:39 AM

## 2024-06-16 ENCOUNTER — Ambulatory Visit (HOSPITAL_COMMUNITY): Admitting: Physical Therapy

## 2024-06-16 DIAGNOSIS — M79661 Pain in right lower leg: Secondary | ICD-10-CM | POA: Diagnosis not present

## 2024-06-16 DIAGNOSIS — S81802S Unspecified open wound, left lower leg, sequela: Secondary | ICD-10-CM | POA: Diagnosis not present

## 2024-06-16 DIAGNOSIS — S81801S Unspecified open wound, right lower leg, sequela: Secondary | ICD-10-CM

## 2024-06-16 DIAGNOSIS — M79662 Pain in left lower leg: Secondary | ICD-10-CM | POA: Diagnosis not present

## 2024-06-16 DIAGNOSIS — R600 Localized edema: Secondary | ICD-10-CM | POA: Diagnosis not present

## 2024-06-16 NOTE — Therapy (Signed)
 OUTPATIENT PHYSICAL THERAPY Wound Treatment/Progress    Patient Name: Kaitlyn Sanchez MRN: [id number] DOB:1935/02/23, 89 y.o., female Today's Date: 06/16/2024 Progress Note Reporting Period 05/12/24 to 06/16/24  See note below for Objective Data and Assessment of Progress/Goals.      PCP: Marvine Rush, MD REFERRING PROVIDER: Marvine Rush, MD  END OF SESSION:  PT End of Session - 06/16/24 0959     Visit Number 10    Number of Visits 12    Date for PT Re-Evaluation 06/23/24    Authorization Type 12 visits approved from 05/14/24 thru 06/25/24    Authorization - Visit Number 10    Authorization - Number of Visits 12    Progress Note Due on Visit 12    PT Start Time 0910    PT Stop Time 0955    PT Time Calculation (min) 45 min    Activity Tolerance Patient tolerated treatment well    Behavior During Therapy [facility] for tasks assessed/performed            Past Medical History:  Diagnosis Date   Essential hypertension    GERD (gastroesophageal reflux disease)    History of cardiac catheterization 2013   Mild coronary atherosclerosis - [facility]   Hyperlipidemia    Hypertensive retinopathy    Hypothyroidism    Past Surgical History:  Procedure Laterality Date   APPENDECTOMY     BREAST CYST ASPIRATION Right    unsure when   CARPAL TUNNEL RELEASE     CATARACT EXTRACTION W/PHACO Left 12/28/2013   Procedure: CATARACT EXTRACTION PHACO AND INTRAOCULAR LENS PLACEMENT (IOC);  Surgeon: Cherene Mania, MD;  Location: [facility];  Service: Ophthalmology;  Laterality: Left;  CDE 18.64   CATARACT EXTRACTION W/PHACO Right 01/07/2014   Procedure: CATARACT EXTRACTION PHACO AND INTRAOCULAR LENS PLACEMENT (IOC);  Surgeon: Cherene Mania, MD;  Location: [facility];  Service: Ophthalmology;  Laterality: Right;  CDE:11.01   COLONOSCOPY     ESOPHAGOGASTRODUODENOSCOPY N/A 10/11/2014   RMR:non critical schatzkis ring/HH   EYE SURGERY     LUMBAR LAMINECTOMY     X 2   TONSILLECTOMY     Patient Active Problem List    Diagnosis Date Noted   Hypothyroidism following radioiodine therapy 05/11/2020   Hyponatremia--very symptomatic with seizures 03/17/2020   Essential hypertension    GERD (gastroesophageal reflux disease)    Hypothyroidism    Hyperlipidemia    Hypocalcemia    Seizure--in the setting of low sodium and Levaquin use    Constipation 08/19/2015   Abdominal pain 08/19/2015   Mucosal abnormality of esophagus    Dyspepsia 10/07/2014   Venous hum 11/23/2012   COPD (chronic obstructive pulmonary disease) (HCC) 10/30/2012   Dizziness 10/30/2012   Hypertensive emergency 06/07/2012   H N P-LUMBAR 09/01/2008   Displacement of lumbar intervertebral disc 09/01/2008   LOW BACK PAIN 01/28/2008   SCIATICA 01/28/2008    ONSET DATE: 02/17/24:  Approximate.   REFERRING DIAG: Right leg wound   THERAPY DIAG:  Right leg wound   Rationale for Evaluation and Treatment: Rehabilitation     06/16/24 0001  Subjective Assessment  Subjective Pt states that her legs have been feeling good.  Patient and Family Stated Goals wounds to heal.  Date of Onset 02/17/24  Prior Treatments four antibiotics and self care.  Currently on chepalexin  Pain Assessment  Pain Scale 0-10  Pain Score 0  Evaluation and Treatment  Evaluation and Treatment Procedures Explained to Patient/Family Yes  Evaluation and Treatment Procedures agreed  to  Wound 05/12/24 1020 Atypical Pretibial Right  Date First Assessed/Time First Assessed: 05/12/24 1020   Present on Original Admission: Yes  Primary Wound Type: Atypical  Secondary Wound Type - Atypical: (c)   Location: Pretibial  Location Orientation: Right  Wound Image    Site / Wound Assessment Dry;Granulation tissue (was black)  Peri-wound Assessment Intact (was edematous.)  Wound Length (cm) 0.3 cm (was 4)  Wound Width (cm) 0.2 cm (was .7)  Wound Surface Area (cm^2) 0.05 cm^2  Drainage Amount None  Treatments Cleansed;Moisturizing cream;Site care;Other  (Comment) (debridement)  Dressing Type Compression wrap (lotion, vaseline, medihoney, 2x2, profore lite)  Dressing Changed Changed  Dressing Status Clean;Dry  Wound 05/12/24 1030 Atypical Leg Left  Date First Assessed/Time First Assessed: 05/12/24 1030   Present on Original Admission: Yes  Primary Wound Type: Atypical  Location: Leg  Location Orientation: Left  Wound Image  (medial wound is healed.  posterior wound is pictured.)  Site / Wound Assessment Dry;Friable;Dusky  Wound Length (cm) 0.3 cm (was 3)  Wound Width (cm) 0.4 cm (was 1.2)  Wound Surface Area (cm^2) 0.09 cm^2  Drainage Amount None  Treatments Cleansed;Irrigation;Site care;Special tape;Other (Comment) (debridement.)  Dressing Type Gauze (Comment);Compression wrap (Pt donned compression garment on Lt)  Dressing Status Dry;Clean  Wound Therapy - Assess/Plan/Recommendations  Wound Therapy - Clinical Statement see below  Wound Therapy - Functional Problem List dressing and bathing  Factors Delaying/Impairing Wound Healing Infection - systemic/local;Vascular compromise  Hydrotherapy Plan Debridement;Dressing change;Electrical stimulation  Wound Therapy - Frequency 2X / week (decrease to one time a week at this time.)  Wound Therapy - Current Recommendations PT  Wound Plan debridement and dressing change  Wound Therapy  Dressing  Both  remaining wounds recieved medihoney, 2x2 followed by medipore tape.           PATIENT EDUCATION: Education details: ankle pumps, keep dressing dry if dressing becomes painful remove and replace.  Person educated: Patient Education method: Explanation Education comprehension: verbalized understanding   HOME EXERCISE PROGRAM: Ankle pumps    GOALS: Goals reviewed with patient? No  SHORT TERM GOALS: Target date: 06/02/24  Wounds to be 100% granulated to decrease risk of infection  Baseline: Goal status: MET  2.  Pain in  legs to be no greater than 2/10 Baseline:  Goal  status: MET  3.  Edema will no longer be visible in Rt LE  Baseline:  Goal status: MET   LONG TERM GOALS: Target date: 06/23/24  Wound to be healed  Baseline:  Goal status: IN PROGRESS  2.  PT to have no pain in her legs .  Baseline:  Goal status: MET  3.  Pt to have obtained new compression garments.  Baseline:  Goal status: MET    ASSESSMENT:  CLINICAL IMPRESSION: Pt wounds continue to improve.  PT hypergranulation on pt RT leg was reduced by using the foam.  Pt's 10th progress note completed today, all wounds are 100% granulated after debridement.  Pt is apprehensive about being discharge.  Anticipate pt discharge next visit when all wounds are totally healed.   OBJECTIVE IMPAIRMENTS: decreased activity tolerance, decreased mobility, difficulty walking, increased edema, pain, and decreased skin integrity.  ACTIVITY LIMITATIONS: carrying, lifting, standing, squatting, bathing, hygiene/grooming, and locomotion level  PARTICIPATION LIMITATIONS: community activity  PERSONAL FACTORS: Age, Fitness, and Time since onset of injury/illness/exacerbation are also affecting patient's functional outcome.   REHAB POTENTIAL: Good  CLINICAL DECISION MAKING: Evolving/moderate complexity  EVALUATION COMPLEXITY: Moderate  PLAN:  PT FREQUENCY: 2x/week  PT DURATION: 6 weeks  PLANNED INTERVENTIONS: 97110-Therapeutic exercises, 97535- Self Care, 02859- Manual therapy, and 97597- Wound care (first 20 sq cm)  PLAN FOR NEXT SESSION: PT most likely will be ready for discharge next visit.   Montie Metro, PT CLT [phone]  [facility] Outpatient Rehabilitation [facility] Ph: [phone]  06/16/2024, 10:11 AM

## 2024-06-23 ENCOUNTER — Ambulatory Visit (HOSPITAL_COMMUNITY): Admitting: Physical Therapy

## 2024-06-23 DIAGNOSIS — R600 Localized edema: Secondary | ICD-10-CM | POA: Diagnosis not present

## 2024-06-23 DIAGNOSIS — S81802S Unspecified open wound, left lower leg, sequela: Secondary | ICD-10-CM | POA: Diagnosis not present

## 2024-06-23 DIAGNOSIS — M79662 Pain in left lower leg: Secondary | ICD-10-CM

## 2024-06-23 DIAGNOSIS — M79661 Pain in right lower leg: Secondary | ICD-10-CM | POA: Diagnosis not present

## 2024-06-23 DIAGNOSIS — S81801S Unspecified open wound, right lower leg, sequela: Secondary | ICD-10-CM

## 2024-06-23 NOTE — Therapy (Signed)
 OUTPATIENT PHYSICAL THERAPY Wound Treatment/discharge   Patient Name: Kaitlyn Sanchez MRN: [id number] DOB:14-Dec-1934, 89 y.o., female Today's Date: 06/23/2024 PHYSICAL THERAPY DISCHARGE SUMMARY  Visits from Start of Care: 11  Current functional level related to goals / functional outcomes: Two small wounds remain; both of which are 100% granulated.    Remaining deficits: One small wound on posterior aspect of Lt leg. One small wound on lateral aspect of Rt leg.    Education / Equipment: Pt to moisturize at night.  Shower and don compression in the morning.  Suggested to change to Aquaphor lotion.    Patient agrees to discharge. Patient goals were partially met. Patient is being discharged due to Pt wound's no longer need skilled care.     PCP: Marvine Rush, MD REFERRING PROVIDER: Marvine Rush, MD  END OF SESSION:  PT End of Session - 06/23/24 0947     Visit Number 11    Number of Visits 11    Date for PT Re-Evaluation 06/23/24    Authorization Type 12 visits approved from 05/14/24 thru 06/25/24    Authorization - Visit Number 11    Authorization - Number of Visits 11    Progress Note Due on Visit 11    PT Start Time 0905    PT Stop Time 0940    PT Time Calculation (min) 35 min    Activity Tolerance Patient tolerated treatment well    Behavior During Therapy [facility] for tasks assessed/performed            Past Medical History:  Diagnosis Date   Essential hypertension    GERD (gastroesophageal reflux disease)    History of cardiac catheterization 2013   Mild coronary atherosclerosis - [facility]   Hyperlipidemia    Hypertensive retinopathy    Hypothyroidism    Past Surgical History:  Procedure Laterality Date   APPENDECTOMY     BREAST CYST ASPIRATION Right    unsure when   CARPAL TUNNEL RELEASE     CATARACT EXTRACTION W/PHACO Left 12/28/2013   Procedure: CATARACT EXTRACTION PHACO AND INTRAOCULAR LENS PLACEMENT (IOC);  Surgeon: Cherene Mania, MD;  Location: [facility];   Service: Ophthalmology;  Laterality: Left;  CDE 18.64   CATARACT EXTRACTION W/PHACO Right 01/07/2014   Procedure: CATARACT EXTRACTION PHACO AND INTRAOCULAR LENS PLACEMENT (IOC);  Surgeon: Cherene Mania, MD;  Location: [facility];  Service: Ophthalmology;  Laterality: Right;  CDE:11.01   COLONOSCOPY     ESOPHAGOGASTRODUODENOSCOPY N/A 10/11/2014   RMR:non critical schatzkis ring/HH   EYE SURGERY     LUMBAR LAMINECTOMY     X 2   TONSILLECTOMY     Patient Active Problem List   Diagnosis Date Noted   Hypothyroidism following radioiodine therapy 05/11/2020   Hyponatremia--very symptomatic with seizures 03/17/2020   Essential hypertension    GERD (gastroesophageal reflux disease)    Hypothyroidism    Hyperlipidemia    Hypocalcemia    Seizure--in the setting of low sodium and Levaquin use    Constipation 08/19/2015   Abdominal pain 08/19/2015   Mucosal abnormality of esophagus    Dyspepsia 10/07/2014   Venous hum 11/23/2012   COPD (chronic obstructive pulmonary disease) (HCC) 10/30/2012   Dizziness 10/30/2012   Hypertensive emergency 06/07/2012   H N P-LUMBAR 09/01/2008   Displacement of lumbar intervertebral disc 09/01/2008   LOW BACK PAIN 01/28/2008   SCIATICA 01/28/2008    ONSET DATE: 02/17/24:  Approximate.   REFERRING DIAG: Right leg wound   THERAPY DIAG:  Right leg  wound   Rationale for Evaluation and Treatment: Rehabilitation     06/23/24 0001  Subjective Assessment  Subjective Pt has no complaints.  Took dressings off Saturday and redressed.  Patient and Family Stated Goals wounds to heal.  Date of Onset 02/17/24  Prior Treatments four antibiotics and self care.  Currently on chepalexin  Pain Assessment  Pain Scale 0-10  Pain Score 0  Evaluation and Treatment  Evaluation and Treatment Procedures Explained to Patient/Family Yes  Evaluation and Treatment Procedures agreed to  Wound 05/12/24 1020 Atypical Pretibial Right  Date First Assessed/Time First Assessed:  05/12/24 1020   Present on Original Admission: Yes  Primary Wound Type: Atypical  Secondary Wound Type - Atypical: (c)   Location: Pretibial  Location Orientation: Right  Wound Image Picture is following debridement of devitalized tissue.   Site / Wound Assessment Dry;Granulation tissue (was black)  Peri-wound Assessment Intact (was edematous.)  Drainage Amount None  Treatments Cleansed  Dressing Type Honey;Gauze (Comment) (lotion, vaseline, medihoney, 2x2, profore lite)  Dressing Changed Changed  Dressing Status Clean;Dry  Wound 05/12/24 1030 Atypical Leg Left  Date First Assessed/Time First Assessed: 05/12/24 1030   Present on Original Admission: Yes  Primary Wound Type: Atypical  Location: Leg  Location Orientation: Left  Wound Image Following debridement.   Site / Wound Assessment Dry;Friable;Dusky  Wound Length (cm) 0.3 cm  Wound Width (cm) 0.3 cm  Wound Surface Area (cm^2) 0.07 cm^2  Drainage Amount None  Treatments Moisturizing cream;Site care;Special tape  Dressing Type Gauze (Comment);Compression wrap (Pt donned compression garment on Lt)  Dressing Status Dry;Clean  Wound Therapy - Assess/Plan/Recommendations  Wound Therapy - Clinical Statement see below  Wound Therapy - Functional Problem List dressing and bathing  Factors Delaying/Impairing Wound Healing Infection - systemic/local;Vascular compromise  Hydrotherapy Plan Debridement;Dressing change;Electrical stimulation  Wound Therapy - Frequency 2X / week (decrease to one time a week at this time.)  Wound Therapy - Current Recommendations PT  Wound Plan Pt wounds ready to be discharged to self care at this time.  Wound Therapy  Dressing  Both  remaining wounds recieved medihoney, 2x2 followed by medipore tape.          PATIENT EDUCATION: Education details: ankle pumps, keep dressing dry if dressing becomes painful remove and replace.  Person educated: Patient Education method: Explanation Education  comprehension: verbalized understanding   HOME EXERCISE PROGRAM: Ankle pumps    GOALS: Goals reviewed with patient? No  SHORT TERM GOALS: Target date: 06/02/24  Wounds to be 100% granulated to decrease risk of infection  Baseline: Goal status: MET  2.  Pain in  legs to be no greater than 2/10 Baseline:  Goal status: MET  3.  Edema will no longer be visible in Rt LE  Baseline:  Goal status: MET   LONG TERM GOALS: Target date: 06/23/24  Wound to be healed  Baseline:  Goal status: IN PROGRESS  2.  PT to have no pain in her legs .  Baseline:  Goal status: MET  3.  Pt to have obtained new compression garments.  Baseline:  Goal status: MET    ASSESSMENT:  CLINICAL IMPRESSION: Pt wounds continue to improve.  Wounds are not totally healed but after debridement therapist does not believe that they are in need of skilled care.  OBJECTIVE IMPAIRMENTS: decreased activity tolerance, decreased mobility, difficulty walking, increased edema, pain, and decreased skin integrity.  ACTIVITY LIMITATIONS: carrying, lifting, standing, squatting, bathing, hygiene/grooming, and locomotion level  PARTICIPATION LIMITATIONS: community  activity  PERSONAL FACTORS: Age, Fitness, and Time since onset of injury/illness/exacerbation are also affecting patient's functional outcome.   REHAB POTENTIAL: Good  CLINICAL DECISION MAKING: Evolving/moderate complexity  EVALUATION COMPLEXITY: Moderate  PLAN: PT FREQUENCY: 2x/week  PT DURATION: 6 weeks  PLANNED INTERVENTIONS: 97110-Therapeutic exercises, 97535- Self Care, 02859- Manual therapy, and 97597- Wound care (first 20 sq cm)  PLAN FOR NEXT SESSION: Discharge to self care at this time.   Montie Metro, PT CLT [phone]  [facility] Ph: [phone]  06/23/2024, 9:52 AM

## 2024-07-05 ENCOUNTER — Encounter: Payer: Self-pay | Admitting: Emergency Medicine

## 2024-07-05 ENCOUNTER — Ambulatory Visit
Admission: EM | Admit: 2024-07-05 | Discharge: 2024-07-05 | Disposition: A | Discharge status: Home / Self Care | Attending: Internal Medicine | Admitting: Internal Medicine

## 2024-07-05 DIAGNOSIS — N3001 Acute cystitis with hematuria: Secondary | ICD-10-CM | POA: Insufficient documentation

## 2024-07-05 LAB — POCT URINE DIPSTICK
Glucose, UA: 250 mg/dL — AB
Nitrite, UA: POSITIVE — AB
Protein Ur, POC: >=300 mg/dL — AB
Spec Grav, UA: 1.015 (ref 1.010–1.025)
Urobilinogen, UA: >=8 U/dL — AB
pH, UA: 6 (ref 5.0–8.0)

## 2024-07-05 MED ORDER — CEPHALEXIN 500 MG PO CAPS: 500.0000 mg | ORAL_CAPSULE | Freq: Two times a day (BID) | ORAL | 0 refills | Status: AC

## 2024-07-05 NOTE — ED Provider Notes (Signed)
 [facility]    CSN: [id number] Arrival date & time: 07/05/24  1111      History   Chief Complaint No chief complaint on file.   HPI Kaitlyn Sanchez is a 89 y.o. female.   Kaitlyn Sanchez is a 89 y.o. female presenting for chief complaint of dysuria, urinary frequency, and sensation of incomplete bladder emptying/urinary urgency that started abruptly this morning upon waking.  She additionally complains of pressure to the bladder area.  Denies nausea, vomiting, diarrhea, low back pain, dizziness, fever, chills, headache, and viral URI symptoms.  Denies recent antibiotic or steroid use in the last 90 days.  She does not take an SGLT2 inhibitor and is not a diabetic.  She took AZO this morning which helped a little bit with her urinary symptoms but the bladder pressure still persists.     Past Medical History:  Diagnosis Date   Essential hypertension    GERD (gastroesophageal reflux disease)    History of cardiac catheterization 2013   Mild coronary atherosclerosis - [facility]   Hyperlipidemia    Hypertensive retinopathy    Hypothyroidism     Patient Active Problem List   Diagnosis Date Noted   Hypothyroidism following radioiodine therapy 05/11/2020   Hyponatremia--very symptomatic with seizures 03/17/2020   Essential hypertension    GERD (gastroesophageal reflux disease)    Hypothyroidism    Hyperlipidemia    Hypocalcemia    Seizure--in the setting of low sodium and Levaquin use    Constipation 08/19/2015   Abdominal pain 08/19/2015   Mucosal abnormality of esophagus    Dyspepsia 10/07/2014   Venous hum 11/23/2012   COPD (chronic obstructive pulmonary disease) (HCC) 10/30/2012   Dizziness 10/30/2012   Hypertensive emergency 06/07/2012   H N P-LUMBAR 09/01/2008   Displacement of lumbar intervertebral disc 09/01/2008   LOW BACK PAIN 01/28/2008   SCIATICA 01/28/2008    Past Surgical History:  Procedure Laterality Date   APPENDECTOMY     BREAST CYST  ASPIRATION Right    unsure when   CARPAL TUNNEL RELEASE     CATARACT EXTRACTION W/PHACO Left 12/28/2013   Procedure: CATARACT EXTRACTION PHACO AND INTRAOCULAR LENS PLACEMENT (IOC);  Surgeon: Cherene Mania, MD;  Location: [facility];  Service: Ophthalmology;  Laterality: Left;  CDE 18.64   CATARACT EXTRACTION W/PHACO Right 01/07/2014   Procedure: CATARACT EXTRACTION PHACO AND INTRAOCULAR LENS PLACEMENT (IOC);  Surgeon: Cherene Mania, MD;  Location: [facility];  Service: Ophthalmology;  Laterality: Right;  CDE:11.01   COLONOSCOPY     ESOPHAGOGASTRODUODENOSCOPY N/A 10/11/2014   RMR:non critical schatzkis ring/HH   EYE SURGERY     LUMBAR LAMINECTOMY     X 2   TONSILLECTOMY      OB History     Gravida      Para      Term      Preterm      AB      Living  3      SAB      IAB      Ectopic      Multiple      Live Births               Home Medications    Prior to Admission medications   Medication Sig Start Date End Date Taking? Authorizing Provider  cephALEXin (KEFLEX) 500 MG capsule Take 1 capsule (500 mg total) by mouth 2 (two) times daily for 7 days. 07/05/24 07/12/24 Yes Enedelia Dorna HERO, FNP  amLODipine  (NORVASC ) 10 MG tablet Take 10 mg by mouth every evening.  03/14/20   [provider]  aspirin  EC 81 MG tablet Take 243 mg by mouth at bedtime.    [provider]  Calcium  Carbonate-Vit D-Min (CALTRATE 600+D PLUS PO) Take 1 tablet by mouth daily.    [provider]  Chelated Potassium 99 MG TABS Take 1 tablet by mouth daily.    [provider]  Cholecalciferol (VITAMIN D3) 125 MCG (5000 UT) CAPS Take 1 capsule by mouth daily.    [provider]  docusate sodium  (COLACE) 100 MG capsule Take 300 mg by mouth at bedtime.     [provider]  Flaxseed, Linseed, (FLAXSEED OIL) 1000 MG CAPS Take 1,000 mg by mouth daily.     [provider]  glucosamine-chondroitin 500-400 MG tablet Take 1 tablet by mouth daily.     [provider]  labetalol  (NORMODYNE ) 100 MG tablet Take 1 tablet by mouth 3 (three) times daily. 02/22/20   [provider]  levothyroxine  (SYNTHROID ) 100 MCG tablet TAKE 1 TABLET BY MOUTH ONCE A DAY. Patient taking differently: Take 110 mcg by mouth daily. 09/11/22   Nida, Gebreselassie W, MD  LORazepam  (ATIVAN ) 1 MG tablet Take 0.5 mg by mouth 2 (two) times daily.    [provider]  meclizine  (ANTIVERT ) 12.5 MG tablet Take 1 tablet (12.5 mg total) by mouth 2 (two) times daily as needed for dizziness. 06/23/23   Elnor Jayson LABOR, DO  multivitamin-iron-minerals-folic acid (CENTRUM) chewable tablet Chew 1 tablet by mouth daily.    [provider]  olmesartan (BENICAR) 40 MG tablet Take 40 mg by mouth daily. 02/08/20   [provider]  Omega 3 1200 MG CAPS Take 1,200 mg by mouth daily.     [provider]  vitamin B-12 (CYANOCOBALAMIN) 1000 MCG tablet Take 1,000 mcg by mouth daily.    [provider]  vitamin C (ASCORBIC ACID) 500 MG tablet Take 500 mg by mouth daily.    [provider]  zinc gluconate 50 MG tablet Take 50 mg by mouth daily.    [provider]    Family History Family History  Problem Relation Age of Onset   Colon cancer Other        no first degree relatives   Renal cancer Father    Heart attack Father    Glaucoma Mother    Heart failure Mother     Social History Social History   Tobacco Use   Smoking status: Never    Passive exposure: Never   Smokeless tobacco: Never  Vaping Use   Vaping status: Never Used  Substance Use Topics   Alcohol use: No   Drug use: No     Allergies   Levaquin [levofloxacin]   Review of Systems Review of Systems Per HPI  Physical Exam Triage Vital Signs ED Triage Vitals  Encounter Vitals Group     BP 07/05/24 1148 (!) 156/69     Girls Systolic BP Percentile --      Girls Diastolic BP Percentile --      Boys Systolic BP Percentile --      Boys  Diastolic BP Percentile --      Pulse Rate 07/05/24 1150 67     Resp 07/05/24 1148 18     Temp 07/05/24 1148 98.2 F (36.8 C)     Temp Source 07/05/24 1148 Oral     SpO2 07/05/24 1150 92 %  Weight --      Height --      Head Circumference --      Peak Flow --      Pain Score 07/05/24 1150 2     Pain Loc --      Pain Education --      Exclude from Growth Chart --    No data found.  Updated Vital Signs BP (!) 156/69 (BP Location: Left Arm)   Pulse 67   Temp 98.2 F (36.8 C) (Oral)   Resp 18   SpO2 92%   Visual Acuity Right Eye Distance:   Left Eye Distance:   Bilateral Distance:    Right Eye Near:   Left Eye Near:    Bilateral Near:     Physical Exam Vitals and nursing note reviewed.  Constitutional:      Appearance: She is not ill-appearing or toxic-appearing.  HENT:     Head: Normocephalic and atraumatic.     Right Ear: Hearing and external ear normal.     Left Ear: Hearing and external ear normal.     Nose: Nose normal.     Mouth/Throat:     Lips: Pink.     Mouth: Mucous membranes are moist.     Pharynx: No posterior oropharyngeal erythema.  Eyes:     General: Lids are normal. Vision grossly intact. Gaze aligned appropriately.     Extraocular Movements: Extraocular movements intact.     Conjunctiva/sclera: Conjunctivae normal.  Cardiovascular:     Rate and Rhythm: Normal rate and regular rhythm.     Heart sounds: Normal heart sounds, S1 normal and S2 normal.  Pulmonary:     Effort: Pulmonary effort is normal. No respiratory distress.     Breath sounds: Normal breath sounds and air entry.  Abdominal:     General: Bowel sounds are normal.     Palpations: Abdomen is soft.     Tenderness: There is no abdominal tenderness. There is no right CVA tenderness, left CVA tenderness or guarding.  Musculoskeletal:     Cervical back: Neck supple.  Skin:    General: Skin is warm and dry.     Capillary Refill: Capillary refill takes less than 2 seconds.      Findings: No rash.  Neurological:     General: No focal deficit present.     Mental Status: She is alert and oriented to person, place, and time. Mental status is at baseline.     Cranial Nerves: No dysarthria or facial asymmetry.  Psychiatric:        Mood and Affect: Mood normal.        Speech: Speech normal.        Behavior: Behavior normal.        Thought Content: Thought content normal.        Judgment: Judgment normal.      UC Treatments / Results  Labs (all labs ordered are listed, but only abnormal results are displayed) Labs Reviewed  POCT URINE DIPSTICK - Abnormal; Notable for the following components:      Result Value   Color, UA orange (*)    Clarity, UA cloudy (*)    Glucose, UA =250 (*)    Bilirubin, UA small (*)    Ketones, POC UA small (15) (*)    Blood, UA small (*)    Protein Ur, POC >=300 (*)    Urobilinogen, UA >=8.0 (*)    Nitrite, UA Positive (*)    Leukocytes,  UA Large (3+) (*)    All other components within normal limits    EKG   Radiology No results found.  Procedures Procedures (including critical care time)  Medications Ordered in UC Medications - No data to display  Initial Impression / Assessment and Plan / UC Course  I have reviewed the triage vital signs and the nursing notes.  Pertinent labs & imaging results that were available during my care of the patient were reviewed by me and considered in my medical decision making (see chart for details).   1.  Acute cystitis with hematuria Presentation is consistent with acute uncomplicated UTI. She took AZO, therefore the urinalysis screening in clinic is falsely positive. We will send a urine culture to evaluate further. Keflex antibiotic twice daily for 7 days ordered. Reviewed labs showing intact renal function with GFR greater than 60 1-year ago in September 2024. No signs of pyelonephritis, nephrolithiasis, sepsis. She is on a fluid restricted diet, advised to avoid urinary  irritants. Recommend follow-up with PCP in the next 7 days for recheck to ensure symptoms have improved.  Daughter to call if patient and Asberry Jacob (other daughter) do not pick up phone for urine culture results:  Marval Pouch [phone]  Counseled patient on potential for adverse effects with medications prescribed/recommended today, strict ER and return-to-clinic precautions discussed, patient verbalized understanding.    Final Clinical Impressions(s) / UC Diagnoses   Final diagnoses:  Acute cystitis with hematuria     Discharge Instructions      Your urine shows you likely have a urinary tract infection.   I have sent your urine for culture to confirm this.   We will call you if we need to change your antibiotic when we find out the type of bacteria growing in your bladder.  Take antibiotic as directed with a snack/food to avoid stomach upset. To avoid GI upset please take this medication with food.   Drink within your water  limits.   If you develop pain in your upper back on one side, fever despite taking antibiotic, nausea and vomiting where you cannot keep anything down for 24 hours, dizziness/severe headache, or decreased urinary output, please go to the ER. Follow-up with PCP.      ED Prescriptions     Medication Sig Dispense Auth. Provider   cephALEXin (KEFLEX) 500 MG capsule Take 1 capsule (500 mg total) by mouth 2 (two) times daily for 7 days. 14 capsule Enedelia Dorna HERO, FNP      PDMP not reviewed this encounter.   Enedelia Dorna HERO, [place] 07/05/24 1232

## 2024-07-05 NOTE — ED Triage Notes (Signed)
 Burning on urination and lower ABD pain that started this morning.  Took AZO this morning

## 2024-07-05 NOTE — Discharge Instructions (Signed)
 Your urine shows you likely have a urinary tract infection.   I have sent your urine for culture to confirm this.   We will call you if we need to change your antibiotic when we find out the type of bacteria growing in your bladder.  Take antibiotic as directed with a snack/food to avoid stomach upset. To avoid GI upset please take this medication with food.   Drink within your water  limits.   If you develop pain in your upper back on one side, fever despite taking antibiotic, nausea and vomiting where you cannot keep anything down for 24 hours, dizziness/severe headache, or decreased urinary output, please go to the ER. Follow-up with PCP.

## 2024-07-07 ENCOUNTER — Ambulatory Visit (HOSPITAL_COMMUNITY): Payer: Self-pay

## 2024-07-07 LAB — URINE CULTURE: Culture: >=100000 — AB

## 2024-08-11 NOTE — Progress Notes (Signed)
 [facility] - Clinic Note  08/18/2024     CHIEF COMPLAINT Patient presents for Retina Follow Up  HISTORY OF PRESENT ILLNESS: Kaitlyn Sanchez is a 89 y.o. female who presents to the clinic today for:   HPI     Retina Follow Up   Diagnosis: hypertensive retinopathy.  In both eyes.  This started 5.5 years ago.  Severity is moderate.  Duration of 4.5 months.  Since onset it is stable.  I, the attending physician,  performed the HPI with the patient and updated documentation appropriately.        Comments   Pt denies any changes in vision/no FOL/floaters/pain. Pt uses ATS PRN.      Last edited by Valdemar Rogue, MD on 08/23/2024  2:51 AM.     Pt states vision is stable, no changes. No new health issues.    Referring physician: Marvine Rush, MD [address] [place],  [place] [address]  HISTORICAL INFORMATION:   Selected notes from the MEDICAL RECORD NUMBER Referred by Dr. Oneil Kawasaki for concern of diabetic retinopathy LEE: 03.11.20 (M. Cotter) [BCVA: OD: 20/20- OS: 20/20-] Ocular Hx-pseudo OU (Dr. Cherene Mania, 2015), HTN ret, vitreous degeneration PMH-HTN, HLD, hypothyroidism    CURRENT MEDICATIONS: No current outpatient medications on file. (Ophthalmic Drugs)   No current facility-administered medications for this visit. (Ophthalmic Drugs)   Current Outpatient Medications (Other)  Medication Sig   amLODipine  (NORVASC ) 10 MG tablet Take 10 mg by mouth every evening.    aspirin  EC 81 MG tablet Take 243 mg by mouth at bedtime.   Calcium  Carbonate-Vit D-Min (CALTRATE 600+D PLUS PO) Take 1 tablet by mouth daily.   Chelated Potassium 99 MG TABS Take 1 tablet by mouth daily.   Cholecalciferol (VITAMIN D3) 125 MCG (5000 UT) CAPS Take 1 capsule by mouth daily.   docusate sodium  (COLACE) 100 MG capsule Take 300 mg by mouth at bedtime.    Flaxseed, Linseed, (FLAXSEED OIL) 1000 MG CAPS Take 1,000 mg by mouth daily.    glucosamine-chondroitin 500-400 MG tablet  Take 1 tablet by mouth daily.   labetalol  (NORMODYNE ) 100 MG tablet Take 1 tablet by mouth 3 (three) times daily.   levothyroxine  (SYNTHROID ) 100 MCG tablet TAKE 1 TABLET BY MOUTH ONCE A DAY. (Patient taking differently: Take 110 mcg by mouth daily.)   LORazepam  (ATIVAN ) 1 MG tablet Take 0.5 mg by mouth 2 (two) times daily.   meclizine  (ANTIVERT ) 12.5 MG tablet Take 1 tablet (12.5 mg total) by mouth 2 (two) times daily as needed for dizziness.   multivitamin-iron-minerals-folic acid (CENTRUM) chewable tablet Chew 1 tablet by mouth daily.   olmesartan (BENICAR) 40 MG tablet Take 40 mg by mouth daily.   Omega 3 1200 MG CAPS Take 1,200 mg by mouth daily.    vitamin B-12 (CYANOCOBALAMIN) 1000 MCG tablet Take 1,000 mcg by mouth daily.   vitamin C (ASCORBIC ACID) 500 MG tablet Take 500 mg by mouth daily.   zinc gluconate 50 MG tablet Take 50 mg by mouth daily.   No current facility-administered medications for this visit. (Other)   REVIEW OF SYSTEMS: ROS   Positive for: Gastrointestinal, Eyes, Respiratory Negative for: Constitutional, Neurological, Skin, Genitourinary, Musculoskeletal, HENT, Endocrine, Cardiovascular, Psychiatric, Allergic/Imm, Heme/Lymph Last edited by Elnor Avelina RAMAN, COT on 08/18/2024  9:40 AM.       ALLERGIES Allergies  Allergen Reactions   Levaquin [Levofloxacin] Other (See Comments)    Seizures   PAST MEDICAL  HISTORY Past Medical History:  Diagnosis Date   Essential hypertension    GERD (gastroesophageal reflux disease)    History of cardiac catheterization 2013   Mild coronary atherosclerosis - [facility]   Hyperlipidemia    Hypertensive retinopathy    Hypothyroidism    Past Surgical History:  Procedure Laterality Date   APPENDECTOMY     BREAST CYST ASPIRATION Right    unsure when   CARPAL TUNNEL RELEASE     CATARACT EXTRACTION W/PHACO Left 12/28/2013   Procedure: CATARACT EXTRACTION PHACO AND INTRAOCULAR LENS PLACEMENT (IOC);  Surgeon: Cherene Mania, MD;   Location: [facility];  Service: Ophthalmology;  Laterality: Left;  CDE 18.64   CATARACT EXTRACTION W/PHACO Right 01/07/2014   Procedure: CATARACT EXTRACTION PHACO AND INTRAOCULAR LENS PLACEMENT (IOC);  Surgeon: Cherene Mania, MD;  Location: [facility];  Service: Ophthalmology;  Laterality: Right;  CDE:11.01   COLONOSCOPY     ESOPHAGOGASTRODUODENOSCOPY N/A 10/11/2014   RMR:non critical schatzkis ring/HH   EYE SURGERY     LUMBAR LAMINECTOMY     X 2   TONSILLECTOMY     FAMILY HISTORY Family History  Problem Relation Age of Onset   Colon cancer Other        no first degree relatives   Renal cancer Father    Heart attack Father    Glaucoma Mother    Heart failure Mother    SOCIAL HISTORY Social History   Tobacco Use   Smoking status: Never    Passive exposure: Never   Smokeless tobacco: Never  Vaping Use   Vaping status: Never Used  Substance Use Topics   Alcohol use: No   Drug use: No       OPHTHALMIC EXAM: Base Eye Exam     Visual Acuity (Snellen - Linear)       Right Left   Dist [place] 20/20 20/20         Tonometry (Tonopen, 9:43 AM)       Right Left   Pressure 16 21         Pupils       Pupils Dark Light Shape React APD   Right PERRL 3 2 Round Brisk None   Left PERRL 3 2 Round Brisk None         Visual Fields       Left Right    Full Full         Extraocular Movement       Right Left    Full, Ortho Full, Ortho         Neuro/Psych     Oriented x3: Yes   Mood/Affect: Normal         Dilation     Both eyes: 1.0% Mydriacyl, 2.5% Phenylephrine  @ 9:43 AM           Slit Lamp and Fundus Exam     Slit Lamp Exam       Right Left   Lids/Lashes Dermatochalasis - upper lid, Ptosis Dermatochalasis - upper lid, Ptosis   Conjunctiva/Sclera mild temporal Pinguecula mild nasal and Temporal Pinguecula   Cornea Arcus, Well healed cataract wounds, tear film debris, trace PEE Arcus, Well healed cataract wounds, trace PEE, trace tear film debris    Anterior Chamber deep and clear deep and clear   Iris Round and dilated Round and moderately dilated   Lens PC IOL in good position with open PC PC IOL in good position with open PC   Anterior Vitreous syneresis, Posterior vitreous  detachment Vitreous syneresis, Posterior vitreous detachment, vitreous condensations inferiorly         Fundus Exam       Right Left   Disc Pink and Sharp Pink and Sharp   C/D Ratio 0.4 0.4   Macula Blunted foveal reflex, focal cluster of DBH temporal to fovea with interval improvement in edema / cystic changes, no exudates Flat, good foveal reflex, mild RPE mottling, trace Epiretinal membrane, rare MA, No edema   Vessels attenuated, Tortuous attenuated, mild tortuosity   Periphery Attached, No heme Attached, no heme           IMAGING AND PROCEDURES  Imaging and Procedures for @TODAY @  OCT, Retina - OU - Both Eyes       Right Eye Quality was good. Central Foveal Thickness: 275. Progression has improved. Findings include no SRF, abnormal foveal contour, intraretinal hyper-reflective material, intraretinal fluid (persistent IRF / cystic changes and focal IRHM IT fovea and mac -- slightly improved).   Left Eye Quality was good. Central Foveal Thickness: 256. Progression has been stable. Findings include normal foveal contour, no IRF, no SRF.   Notes *Images captured and stored on drive  Diagnosis / Impression:  OD: persistent IRF / cystic changes and focal IRHM IT fovea and mac -- slightly improved OS: NFP, no IRF/SRF  Clinical management:  See below  Abbreviations: NFP - Normal foveal profile. CME - cystoid macular edema. PED - pigment epithelial detachment. IRF - intraretinal fluid. SRF - subretinal fluid. EZ - ellipsoid zone. ERM - epiretinal membrane. ORA - outer retinal atrophy. ORT - outer retinal tubulation. SRHM - subretinal hyper-reflective material             ASSESSMENT/PLAN:    ICD-10-CM   1. Hypertensive retinopathy of both  eyes  H35.033 OCT, Retina - OU - Both Eyes    2. Essential hypertension  I10     3. Retinal hemorrhage of right eye  H35.61     4. Pseudophakia of both eyes  Z96.1      1-3. Hypertensive retinopathy OU  - persistent focal intraretinal hemes in macula OD -- slightly improved today  - OCT shows persistent IRF / cystic changes and focal IRHM IT fovea and mac -- slightly improved -- ?mild old BRVO -- 4+ mos since last exam  - BCVA remains 20/20 OU  - review of case shows waxing and waning of IRF/cystic changes, BCVA always 20/20 and pt asymptomatic  - FA (05.05.21) showed focal MA in macula with late leakage OU    - BP  02.25.25 148/83 03.11.24 176/74 10.18.23 162/74 04.19.23 185/71  03.05.2021  167/72 R arm and 147/67 L arm  11.06.2020  147/67  07.06.2020 169/73  - meds adjusted by PCP and cardiology -- BP not regularly checked by patient   - discussed importance of tight BP control  - monitor for now  - discussed potential for injections / focal laser OD if vision decreases  - f/u 6 months, sooner prn, DFE/OCT, possible injection  4. Pseudophakia OU  - s/p CE/IOL OU - Dr. Cherene Mania (2015)  - beautiful surgeries, doing well  - continue to monitor  Ophthalmic Meds Ordered this visit:  No orders of the defined types were placed in this encounter.    Return in about 6 months (around 02/15/2025) for HTN Ret OU, DFE, OCT.  There are no Patient Instructions on file for this visit.   This document serves as a record of services personally performed by  Redell JUDITHANN Hans, MD, PhD. It was created on their behalf by Wanda GEANNIE Keens, COT an ophthalmic technician. The creation of this record is the provider's dictation and/or activities during the visit.    Electronically signed by:  Wanda GEANNIE Keens, COT  08/23/24 2:52 AM  This document serves as a record of services personally performed by Redell JUDITHANN Hans, MD, PhD. It was created on their behalf by Almetta Pesa, an ophthalmic  technician. The creation of this record is the provider's dictation and/or activities during the visit.    Electronically signed by: Almetta Pesa, OA, 08/23/24  2:52 AM  Redell JUDITHANN Hans, M.D., Ph.D. Diseases & Surgery of the Retina and Vitreous [facility]  I have reviewed the above documentation for accuracy and completeness, and I agree with the above. Redell JUDITHANN Hans, M.D., Ph.D. 08/23/24 2:53 AM   Abbreviations: M myopia (nearsighted); A astigmatism; H hyperopia (farsighted); P presbyopia; Mrx spectacle prescription;  CTL contact lenses; OD right eye; OS left eye; OU both eyes  XT exotropia; ET esotropia; PEK punctate epithelial keratitis; PEE punctate epithelial erosions; DES dry eye syndrome; MGD meibomian gland dysfunction; ATs artificial tears; PFAT's preservative free artificial tears; NSC nuclear sclerotic cataract; PSC posterior subcapsular cataract; ERM epi-retinal membrane; PVD posterior vitreous detachment; RD retinal detachment; DM diabetes mellitus; DR diabetic retinopathy; NPDR non-proliferative diabetic retinopathy; PDR proliferative diabetic retinopathy; CSME clinically significant macular edema; DME diabetic macular edema; dbh dot blot hemorrhages; CWS cotton wool spot; POAG primary open angle glaucoma; C/D cup-to-disc ratio; HVF humphrey visual field; GVF goldmann visual field; OCT optical coherence tomography; IOP intraocular pressure; BRVO Branch retinal vein occlusion; CRVO central retinal vein occlusion; CRAO central retinal artery occlusion; BRAO branch retinal artery occlusion; RT retinal tear; SB scleral buckle; PPV pars plana vitrectomy; VH Vitreous hemorrhage; PRP panretinal laser photocoagulation; IVK intravitreal kenalog; VMT vitreomacular traction; MH Macular hole;  NVD neovascularization of the disc; NVE neovascularization elsewhere; AREDS age related eye disease study; ARMD age related macular degeneration; POAG primary open angle glaucoma;  EBMD epithelial/anterior basement membrane dystrophy; ACIOL anterior chamber intraocular lens; IOL intraocular lens; PCIOL posterior chamber intraocular lens; Phaco/IOL phacoemulsification with intraocular lens placement; PRK photorefractive keratectomy; LASIK laser assisted in situ keratomileusis; HTN hypertension; DM diabetes mellitus; COPD chronic obstructive pulmonary disease

## 2024-08-18 ENCOUNTER — Ambulatory Visit (INDEPENDENT_AMBULATORY_CARE_PROVIDER_SITE_OTHER): Admitting: Ophthalmology

## 2024-08-18 ENCOUNTER — Encounter (INDEPENDENT_AMBULATORY_CARE_PROVIDER_SITE_OTHER): Payer: Self-pay | Admitting: Ophthalmology

## 2024-08-18 DIAGNOSIS — Z961 Presence of intraocular lens: Secondary | ICD-10-CM

## 2024-08-18 DIAGNOSIS — H3561 Retinal hemorrhage, right eye: Secondary | ICD-10-CM

## 2024-08-18 DIAGNOSIS — H35033 Hypertensive retinopathy, bilateral: Secondary | ICD-10-CM | POA: Diagnosis not present

## 2024-08-18 DIAGNOSIS — I10 Essential (primary) hypertension: Secondary | ICD-10-CM

## 2024-08-23 ENCOUNTER — Encounter (INDEPENDENT_AMBULATORY_CARE_PROVIDER_SITE_OTHER): Payer: Self-pay | Admitting: Ophthalmology

## 2025-02-15 ENCOUNTER — Encounter (INDEPENDENT_AMBULATORY_CARE_PROVIDER_SITE_OTHER): Admitting: Ophthalmology
